# Patient Record
Sex: Female | Born: 1985
Health system: Southern US, Community
[De-identification: ages and names within clinical notes are randomized; demographics above are authoritative.]

## PROBLEM LIST (undated history)

## (undated) DIAGNOSIS — Z9884 Bariatric surgery status: Secondary | ICD-10-CM

## (undated) DIAGNOSIS — D509 Iron deficiency anemia, unspecified: Secondary | ICD-10-CM

## (undated) DIAGNOSIS — E282 Polycystic ovarian syndrome: Secondary | ICD-10-CM

## (undated) DIAGNOSIS — D649 Anemia, unspecified: Secondary | ICD-10-CM

## (undated) DIAGNOSIS — E559 Vitamin D deficiency, unspecified: Secondary | ICD-10-CM

## (undated) DIAGNOSIS — Z91018 Allergy to other foods: Secondary | ICD-10-CM

## (undated) HISTORY — DX: Allergy to other foods: Z91.018

## (undated) HISTORY — DX: Polycystic ovarian syndrome: E28.2

## (undated) HISTORY — DX: Bariatric surgery status: Z98.84

## (undated) HISTORY — DX: Iron deficiency anemia, unspecified: D50.9

## (undated) HISTORY — DX: Vitamin D deficiency, unspecified: E55.9

---

## 2012-04-24 DIAGNOSIS — Z789 Other specified health status: Secondary | ICD-10-CM | POA: Insufficient documentation

## 2012-11-21 ENCOUNTER — Ambulatory Visit (INDEPENDENT_AMBULATORY_CARE_PROVIDER_SITE_OTHER): Payer: No Typology Code available for payment source | Admitting: Psychiatry

## 2012-11-21 DIAGNOSIS — Z7189 Other specified counseling: Secondary | ICD-10-CM

## 2012-11-21 DIAGNOSIS — F4323 Adjustment disorder with mixed anxiety and depressed mood: Secondary | ICD-10-CM

## 2012-12-06 ENCOUNTER — Ambulatory Visit (INDEPENDENT_AMBULATORY_CARE_PROVIDER_SITE_OTHER): Payer: No Typology Code available for payment source | Admitting: Psychiatry

## 2012-12-06 DIAGNOSIS — F4323 Adjustment disorder with mixed anxiety and depressed mood: Secondary | ICD-10-CM

## 2012-12-06 DIAGNOSIS — Z7189 Other specified counseling: Secondary | ICD-10-CM

## 2012-12-14 ENCOUNTER — Ambulatory Visit (INDEPENDENT_AMBULATORY_CARE_PROVIDER_SITE_OTHER): Payer: No Typology Code available for payment source | Admitting: Psychiatry

## 2012-12-14 DIAGNOSIS — F4323 Adjustment disorder with mixed anxiety and depressed mood: Secondary | ICD-10-CM

## 2012-12-14 DIAGNOSIS — Z7189 Other specified counseling: Secondary | ICD-10-CM

## 2012-12-27 ENCOUNTER — Ambulatory Visit (INDEPENDENT_AMBULATORY_CARE_PROVIDER_SITE_OTHER): Payer: No Typology Code available for payment source | Admitting: Psychiatry

## 2012-12-27 DIAGNOSIS — F4323 Adjustment disorder with mixed anxiety and depressed mood: Secondary | ICD-10-CM

## 2012-12-27 DIAGNOSIS — Z7189 Other specified counseling: Secondary | ICD-10-CM

## 2013-01-10 ENCOUNTER — Ambulatory Visit: Payer: No Typology Code available for payment source | Admitting: Psychiatry

## 2013-01-17 ENCOUNTER — Ambulatory Visit: Payer: No Typology Code available for payment source | Admitting: Psychiatry

## 2013-01-24 ENCOUNTER — Ambulatory Visit: Payer: No Typology Code available for payment source | Admitting: Psychiatry

## 2013-01-25 ENCOUNTER — Ambulatory Visit: Payer: No Typology Code available for payment source | Admitting: Psychiatry

## 2014-01-26 ENCOUNTER — Ambulatory Visit (HOSPITAL_COMMUNITY)
Admission: RE | Admit: 2014-01-26 | Discharge: 2014-01-26 | Disposition: A | Discharge status: Home / Self Care | Payer: No Typology Code available for payment source | Source: Ambulatory Visit | Attending: Family Medicine | Admitting: Family Medicine

## 2014-01-26 ENCOUNTER — Ambulatory Visit (INDEPENDENT_AMBULATORY_CARE_PROVIDER_SITE_OTHER): Payer: Self-pay | Admitting: Family Medicine

## 2014-01-26 VITALS — BP 108/70 | HR 74 | Temp 99.0°F | Resp 19 | Ht 67.5 in | Wt 262.8 lb

## 2014-01-26 DIAGNOSIS — G4452 New daily persistent headache (NDPH): Secondary | ICD-10-CM

## 2014-01-26 DIAGNOSIS — J029 Acute pharyngitis, unspecified: Secondary | ICD-10-CM

## 2014-01-26 LAB — COMPREHENSIVE METABOLIC PANEL
ALBUMIN: 4.1 g/dL (ref 3.5–5.2)
ALK PHOS: 64 U/L (ref 39–117)
ALT: 9 U/L (ref 0–35)
AST: 15 U/L (ref 0–37)
BUN: 10 mg/dL (ref 6–23)
CO2: 25 mEq/L (ref 19–32)
Calcium: 9.2 mg/dL (ref 8.4–10.5)
Chloride: 104 mEq/L (ref 96–112)
Creat: 0.74 mg/dL (ref 0.50–1.10)
Glucose, Bld: 88 mg/dL (ref 70–99)
POTASSIUM: 4.5 meq/L (ref 3.5–5.3)
SODIUM: 138 meq/L (ref 135–145)
Total Bilirubin: 0.9 mg/dL (ref 0.2–1.2)
Total Protein: 7.3 g/dL (ref 6.0–8.3)

## 2014-01-26 LAB — POCT CBC
GRANULOCYTE PERCENT: 63.2 % (ref 37–80)
HCT, POC: 35 % — AB (ref 37.7–47.9)
Hemoglobin: 11.1 g/dL — AB (ref 12.2–16.2)
Lymph, poc: 1.3 (ref 0.6–3.4)
MCH, POC: 27.4 pg (ref 27–31.2)
MCHC: 31.7 g/dL — AB (ref 31.8–35.4)
MCV: 86.4 fL (ref 80–97)
MID (CBC): 0.3 (ref 0–0.9)
MPV: 8.8 fL (ref 0–99.8)
PLATELET COUNT, POC: 241 10*3/uL (ref 142–424)
POC GRANULOCYTE: 2.9 (ref 2–6.9)
POC LYMPH %: 29.2 % (ref 10–50)
POC MID %: 7.6 % (ref 0–12)
RBC: 4.05 M/uL (ref 4.04–5.48)
RDW, POC: 12.7 %
WBC: 4.6 10*3/uL (ref 4.6–10.2)

## 2014-01-26 LAB — POCT URINE PREGNANCY: Preg Test, Ur: NEGATIVE

## 2014-01-26 MED ORDER — KETOROLAC TROMETHAMINE 30 MG/ML IJ SOLN
30.0000 mg | Freq: Once | INTRAMUSCULAR | Status: AC
Start: 2014-01-26 — End: 2014-01-26
  Administered 2014-01-26: 30 mg via INTRAMUSCULAR

## 2014-01-26 MED ORDER — BUTALBITAL-APAP-CAFFEINE 50-325-40 MG PO TABS: 1.0000 | ORAL_TABLET | Freq: Three times a day (TID) | ORAL | Status: AC | PRN

## 2014-01-26 MED ORDER — BUTALBITAL-APAP-CAFFEINE 50-325-40 MG PO TABS: 1.0000 | ORAL_TABLET | Freq: Four times a day (QID) | ORAL | Status: DC | PRN

## 2014-01-26 NOTE — Patient Instructions (Signed)
I hope that you will feel better soon!   You got a shot of toradol again today for headache Don't take further NSAID medications such as ibuprofen or naproxen until tomorrow You can use the fioricet as needed but it can make you a bit sleepy

## 2014-01-26 NOTE — Progress Notes (Signed)
[facility] [address], [place] [address] [phone]  Date:  01/26/2014   Name:  Leah Gilmore   DOB:  04/26/1985   MRN:  [id number]  PCP:  No primary care provider on file.    Chief Complaint: Headache and Nausea   History of Present Illness:  Leah Gilmore is a 28 y.o. very pleasant female patient who presents with the following:  Here today as a new patient with illness. Today is Saturday- two days ago she was seen at another office with suspect strep throat.  She reports that her rapid was negative, a culture is pending.  She got a shot of toradol and is also taking penicilin by mouth.   She has noted a headache for about one week now.   HA is there all the time, it did seem to get better after she had her toradol shot.  The HA is better during the day, worse at night.   Yesterday and last night she took medication- tylenol 500 mg.   She had been taking ibuprofen but changed to tylenol.    She is generally in good health Her throat feels somewhat better.   She has noted some fevers at home; she has gotten up to 100.8. No cough She does have some abdominal pain; she feels constipated.    LMP 01/10/14  She has not noted any LAD This is the worst HA of her life; she is concerned about would like to do a CT scan She notes photophobia, mild phonophobia, nausea but no vomiting, no aura  There are no active problems to display for this patient.   History reviewed. No pertinent past medical history.  History reviewed. No pertinent past surgical history.  History  Substance Use Topics  . Smoking status: Never Smoker   . Smokeless tobacco: Never Used  . Alcohol Use: No    Family History  Problem Relation Age of Onset  . Cancer Father   . Diabetes Maternal Grandmother   . Cancer Maternal Grandfather   . Diabetes Paternal Grandmother   . Hypertension Paternal Grandmother     Allergies not on file  Medication list has been  reviewed and updated.  No current outpatient prescriptions on file prior to visit.   No current facility-administered medications on file prior to visit.    Review of Systems:  As per HPI- otherwise negative.   Physical Examination: Filed Vitals:   01/26/14 0929  BP: 108/70  Pulse: 74  Temp: 99 F (37.2 C)  Resp: 19   Filed Vitals:   01/26/14 0929  Height: 5' 7.5" (1.715 m)  Weight: 262 lb 12.8 oz (119.205 kg)   Body mass index is 40.53 kg/(m^2). Ideal Body Weight: Weight in (lb) to have BMI = 25: 161.7  GEN: WDWN, NAD, Non-toxic, A & O x 3, obese, OW looks well HEENT: Atraumatic, Normocephalic. Neck supple. No masses, No LAD.  Bilateral TM wnl, oropharynx normal.  PEERL,EOMI.   Ears and Nose: No external deformity. CV: RRR, No M/G/R. No JVD. No thrill. No extra heart sounds. PULM: CTA B, no wheezes, crackles, rhonchi. No retractions. No resp. distress. No accessory muscle use. ABD: S, NT, ND, +BS. No rebound. No HSM.  Benign exam EXTR: No c/c/e NEURO Normal gait. Complete neuro exam is normal; normal strength, sensation and DTR all extremities, negative Romberg PSYCH: Normally interactive. Conversant. Not depressed or anxious appearing.  Calm demeanor.   Results for orders placed or performed in  visit on 01/26/14  POCT urine pregnancy  Result Value Ref Range   Preg Test, Ur Negative   POCT CBC  Result Value Ref Range   WBC 4.6 4.6 - 10.2 K/uL   Lymph, poc 1.3 0.6 - 3.4   POC LYMPH PERCENT 29.2 10 - 50 %L   MID (cbc) 0.3 0 - 0.9   POC MID % 7.6 0 - 12 %M   POC Granulocyte 2.9 2 - 6.9   Granulocyte percent 63.2 37 - 80 %G   RBC 4.05 4.04 - 5.48 M/uL   Hemoglobin 11.1 (A) 12.2 - 16.2 g/dL   HCT, POC 47.835.0 (A) 29.537.7 - 47.9 %   MCV 86.4 80 - 97 fL   MCH, POC 27.4 27 - 31.2 pg   MCHC 31.7 (A) 31.8 - 35.4 g/dL   RDW, POC 62.112.7 %   Platelet Count, POC 241 142 - 424 K/uL   MPV 8.8 0 - 99.8 fL    Assessment and Plan: New daily persistent headache - Plan: POCT urine  pregnancy, CT Head Wo Contrast, ketorolac (TORADOL) 30 MG/ML injection 30 mg, butalbital-acetaminophen-caffeine (FIORICET) 50-325-40 MG per tablet, DISCONTINUED: butalbital-acetaminophen-caffeine (FIORICET) 50-325-40 MG per tablet  Sore throat - Plan: POCT CBC, Comprehensive metabolic panel  Here today with worst HA of life.   She may well have mono,  However given her severe headache she would like to do a CT scan of her head today . Will send her for CT, assuming negative she will come back for a toradol shot  Signed Abbe AmsterdamJessica Copland, MD  CT head as below:  CT HEAD WITHOUT CONTRAST  TECHNIQUE: Contiguous axial images were obtained from the base of the skull through the vertex without intravenous contrast.  COMPARISON: None.  FINDINGS: Ventricles, cisterns and other CSF spaces are normal. There is no mass, mass effect, shift of midline structures or acute hemorrhage. No evidence of acute infarction. Remaining bones and soft tissues are within normal.  IMPRESSION: Normal head CT.  She returned to clinic and was given 30 mg of toradol.   May use fioricet also as needed See patient instructions for more details.   Follow-up if not better!

## 2014-01-27 ENCOUNTER — Encounter: Payer: Self-pay | Admitting: Family Medicine

## 2015-10-09 ENCOUNTER — Other Ambulatory Visit (HOSPITAL_COMMUNITY): Payer: Self-pay | Admitting: General Surgery

## 2015-10-23 ENCOUNTER — Ambulatory Visit (HOSPITAL_COMMUNITY)
Admission: RE | Admit: 2015-10-23 | Discharge: 2015-10-23 | Disposition: A | Discharge status: Home / Self Care | Payer: No Typology Code available for payment source | Source: Ambulatory Visit | Attending: General Surgery | Admitting: General Surgery

## 2015-10-29 ENCOUNTER — Ambulatory Visit: Payer: No Typology Code available for payment source | Admitting: Dietician

## 2015-10-30 ENCOUNTER — Encounter: Payer: No Typology Code available for payment source | Attending: General Surgery | Admitting: Dietician

## 2015-10-30 ENCOUNTER — Encounter: Payer: Self-pay | Admitting: Dietician

## 2015-10-30 DIAGNOSIS — Z713 Dietary counseling and surveillance: Secondary | ICD-10-CM | POA: Insufficient documentation

## 2015-10-30 NOTE — Patient Instructions (Addendum)

## 2015-10-30 NOTE — Progress Notes (Signed)
  Pre-Op Assessment Visit:  Pre-Operative RYGB Surgery  Medical Nutrition Therapy:  Appt start time: 1410   End time:  1455.  Patient was seen on 10/30/2015 for Pre-Operative Nutrition Assessment. [facility] coordinator on 10/30/2015.   Preferred Learning Style:   No preference indicated   Learning Readiness:   Ready  Handouts given during visit include:  Pre-Op Goals Bariatric Surgery Protein Shakes   During the appointment today the following Pre-Op Goals were reviewed with the patient: Maintain or lose weight as instructed by your surgeon Make healthy food choices Begin to limit portion sizes Limited concentrated sugars and fried foods Keep fat/sugar in the single digits per serving on   food labels Practice CHEWING your food  (aim for 30 chews per bite or until applesauce consistency) Practice not drinking 15 minutes before, during, and 30 minutes after each meal/snack Avoid all carbonated beverages  Avoid/limit caffeinated beverages  Avoid all sugar-sweetened beverages Consume 3 meals per day; eat every 3-5 hours Make a list of non-food related activities Aim for 64-100 ounces of FLUID daily  Aim for at least 60-80 grams of PROTEIN daily Look for a liquid protein source that contain ?15 g protein and ?5 g carbohydrate  (ex: shakes, drinks, shots)  Patient-Centered Goals: Would like to a be an avid runner and be able to go hiking without pain and play with her kids and be healthy overall.   10 confidence/importance scale  Demonstrated degree of understanding via:  Teach Back  Teaching Method Utilized:  Visual Auditory Hands on  Barriers to learning/adherence to lifestyle change: none  [facility] to enroll in Pre-Op and Post-Op Nutrition Education when surgery date is scheduled.

## 2015-10-31 ENCOUNTER — Encounter: Payer: Self-pay | Admitting: Dietician

## 2015-11-24 ENCOUNTER — Encounter: Payer: No Typology Code available for payment source | Attending: General Surgery | Admitting: Skilled Nursing Facility1

## 2015-11-24 ENCOUNTER — Encounter: Payer: Self-pay | Admitting: Skilled Nursing Facility1

## 2015-11-24 DIAGNOSIS — Z713 Dietary counseling and surveillance: Secondary | ICD-10-CM | POA: Diagnosis present

## 2015-11-24 DIAGNOSIS — E669 Obesity, unspecified: Secondary | ICD-10-CM

## 2015-11-24 NOTE — Progress Notes (Signed)
  Pre-Op Assessment Visit:  Pre-Operative RYGB Surgery  Medical Nutrition Therapy:  Appt start time: 1423   End time:  9532.   Pt returns having lost 3 pounds. Pt states she has 2 more SWL left. Pt states she is Horrible at taking medication simply Cant remember-states she has tried alarms, and many other strategies. Pt states her  Partner is supportive but also not. Pt states her partner has not completely accepted the idea of her having surgery but is now keeping her opinions to herself. Pt states she wants to keep a calendar of taking supplements and bring it in every month to be held accountable. Pt states she is Trying to chew more and struggling with drinking before eating. Pt states she has tried tot alk to her partner about What support looks like. Pt states she is in love with Oreos. Pt states she lost 90 pounds in her life once by not eating due to financial reasons. Pt states none of the children she is taking care of are hers and feels she is rasing her partners children who loves children but the pt states she does not. Pt states she is losing living her own life due to the children she has to raise. Pt states she skips dinner because she has to cook dinner for the children and does not want to make 2 meals. Dietitian gave information on Higinio Roger for this situation. Pt states she is worried she is not eating enough. Recall: Bagel in the morning 10am: fruit Lunch: quinoa and cheese and sour cream bowl Dinner: skip due to making sure the kids are fed 3 kids of her partners, step kid, and foster kid   -Dietitian gave pt counselors in the area hand out.   Patient-Centered Goals: Would like to a be an avid runner and be able to go hiking without pain and play with her kids and be healthy overall.   10 confidence/importance scale  Demonstrated degree of understanding via:  Teach Back  Teaching Method Utilized:  Visual Auditory Hands on  Barriers to learning/adherence to  lifestyle change: none  Patient to call the [facility] to enroll in Pre-Op and Post-Op Nutrition Education when surgery date is scheduled.

## 2015-12-12 ENCOUNTER — Other Ambulatory Visit (HOSPITAL_BASED_OUTPATIENT_CLINIC_OR_DEPARTMENT_OTHER): Payer: Self-pay

## 2015-12-12 DIAGNOSIS — R0683 Snoring: Secondary | ICD-10-CM

## 2015-12-17 ENCOUNTER — Ambulatory Visit (HOSPITAL_BASED_OUTPATIENT_CLINIC_OR_DEPARTMENT_OTHER): Payer: No Typology Code available for payment source | Attending: General Surgery | Admitting: Internal Medicine

## 2015-12-17 DIAGNOSIS — R0683 Snoring: Secondary | ICD-10-CM | POA: Insufficient documentation

## 2015-12-18 ENCOUNTER — Encounter: Payer: Self-pay | Admitting: Skilled Nursing Facility1

## 2015-12-18 ENCOUNTER — Encounter: Payer: No Typology Code available for payment source | Attending: General Surgery | Admitting: Skilled Nursing Facility1

## 2015-12-18 DIAGNOSIS — Z713 Dietary counseling and surveillance: Secondary | ICD-10-CM | POA: Diagnosis not present

## 2015-12-18 DIAGNOSIS — E6609 Other obesity due to excess calories: Secondary | ICD-10-CM

## 2015-12-18 NOTE — Progress Notes (Signed)
  Pre-Op Assessment Visit:  Pre-Operative RYGB Surgery  Medical Nutrition Therapy:  Appt start time: 1410   End time:  1455.   Pt returns having gained 3 pounds.  Pt states she has Started taking flinestones with iron every day, taking them at night with less than 4 ounces of orange juice. Pt states her Eating is a lot better, using meal prep, buying almond and cashew milk and special K with a banana, yesterday a spinach wrap with veggie meat. Pt states she needs to take a grasp of the changes. Pt states she had a sleep study last night. Pt states her chewing has improved. Pt states she is feeding her children better.Pt states she is very excited.  Patient-Centered Goals: Would like to a be an avid runner and be able to go hiking without pain and play with her kids and be healthy overall.   10 confidence/importance scale  Demonstrated degree of understanding via:  Teach Back  Teaching Method Utilized:  Visual Auditory Hands on  Barriers to learning/adherence to lifestyle change: none  Patient to call the [facility] to enroll in Pre-Op and Post-Op Nutrition Education when surgery date is scheduled.

## 2016-01-04 DIAGNOSIS — R0683 Snoring: Secondary | ICD-10-CM | POA: Diagnosis not present

## 2016-01-04 NOTE — Procedures (Signed)
   Patient Name: Leah Gilmore, Quannetta Study Date: 12/17/2015 Gender: Female D.O.B: [birth year] Age (years): 30 Referring Provider: Gaynelle AduEric Wilson Height (inches): 67 Interpreting Physician: Jetty Duhamellinton Jaylenn Altier MD, ABSM Weight (lbs): 314 RPSGT: Clyde SinkBarksdale, [place] BMI: 49 MRN: [id number] Neck Size: 14.00 CLINICAL INFORMATION Sleep Study Type: HST Indication for sleep study: Snoring Epworth Sleepiness Score: 3  SLEEP STUDY TECHNIQUE A multi-channel overnight portable sleep study was performed. The channels recorded were: nasal airflow, thoracic respiratory movement, and oxygen saturation with a pulse oximetry. Snoring was also monitored.  MEDICATIONS Patient self administered medications include: none reported during sleep study  SLEEP ARCHITECTURE Patient was studied for 395.9 minutes. The sleep efficiency was 99.6 % and the patient was supine for 41.6%. The arousal index was 0.0 per hour.  RESPIRATORY PARAMETERS The overall AHI was 0.3 per hour, with a central apnea index of 0.0 per hour. The oxygen nadir was 92% during sleep.  CARDIAC DATA Mean heart rate during sleep was 70.4 bpm.  IMPRESSIONS - No significant obstructive sleep apnea occurred during this study (AHI = 0.3/h). - No significant central sleep apnea occurred during this study (CAI = 0.0/h). - The patient had minimal or no oxygen desaturation during the study (Min O2 = 92%) - Patient snored   DIAGNOSIS - Normal study  RECOMMENDATIONS - Avoid alcohol, sedatives and other CNS depressants that may worsen sleep apnea and disrupt normal sleep architecture. - Sleep hygiene should be reviewed to assess factors that may improve sleep quality. - Weight management and regular exercise should be initiated or continued.  [Electronically signed] 01/04/2016 12:27 PM  Jetty Duhamellinton Jahlisa Rossitto MD, ABSM Diplomate, American Board of Sleep Medicine   NPI: [id number] Waymon BudgeYOUNG,Mariangel Ringley D Diplomate, American Board of Sleep  Medicine  ELECTRONICALLY SIGNED ON:  01/04/2016, 12:26 PM [facility] PH: [phone]   FX: [phone] ACCREDITED BY THE AMERICAN ACADEMY OF SLEEP MEDICINE

## 2016-01-13 ENCOUNTER — Encounter: Payer: No Typology Code available for payment source | Attending: General Surgery | Admitting: Skilled Nursing Facility1

## 2016-01-13 ENCOUNTER — Encounter: Payer: Self-pay | Admitting: Skilled Nursing Facility1

## 2016-01-13 DIAGNOSIS — E6609 Other obesity due to excess calories: Secondary | ICD-10-CM

## 2016-01-13 DIAGNOSIS — Z713 Dietary counseling and surveillance: Secondary | ICD-10-CM | POA: Insufficient documentation

## 2016-01-13 NOTE — Progress Notes (Signed)
  Pre-Op Assessment Visit:  Pre-Operative RYGB Surgery  Medical Nutrition Therapy:  Appt start time: 1410   End time:  1455.   Pt returns having gained 5 pounds. Pt states she is Struggling with drinking with meals. Pt states she struggles with getting enough sleep. Pt states she Gets off at 4:30pm and does not have time to finish all of things she needs to do at the end of the day. Pt does not eat meat or drink milk. Pt states she Only eats eggs, cheese, salmon, shrimp for protein and is afraid to eat soy.Pt states this is her Last SWL appointment.  Morning: boiled egg and premier shake and fruit Watermelon for snack or yogurt Lunch: Chilli Dinner: salmon with quinoa salad Beverages: water, sweet tea  On lunch break walking  Wt: 323 pounds BMI: 50.59  Patient-Centered Goals: Would like to a be an avid runner and be able to go hiking without pain and play with her kids and be healthy overall.   10 confidence/importance scale  Demonstrated degree of understanding via:  Teach Back  Teaching Method Utilized:  Visual Auditory Hands on  Barriers to learning/adherence to lifestyle change: none  Patient to call the [facility] to enroll in Pre-Op and Post-Op Nutrition Education when surgery date is scheduled.

## 2016-01-13 NOTE — Patient Instructions (Addendum)
-  Do not prepare any drink for your meals -Cut out the egg stick with the shake and fruit -Cut out the sweet tea  -Keep working on some efficient ways to end your day  -If you do not have a meal have a protein shake with fruit and vegetable

## 2016-02-26 NOTE — Progress Notes (Signed)
Scheduling pre op-- please place SURGICAL ORDERS IN EPIC  THANKS 

## 2016-03-03 ENCOUNTER — Encounter: Payer: No Typology Code available for payment source | Attending: General Surgery | Admitting: Dietician

## 2016-03-03 DIAGNOSIS — Z713 Dietary counseling and surveillance: Secondary | ICD-10-CM | POA: Insufficient documentation

## 2016-03-03 NOTE — Progress Notes (Signed)
Please place SURGICAL ORDERS IN EPIC  THANKS 

## 2016-03-04 ENCOUNTER — Encounter: Payer: Self-pay | Admitting: Dietician

## 2016-03-04 NOTE — Progress Notes (Signed)
  Pre-Operative Nutrition Class:  Appt start time: 2751   End time:  1830.  Patient was seen on 03/03/2016 for Pre-Operative Bariatric Surgery Education at [facility].   Surgery date: 03/29/2016 Surgery type: RYGB Start weight at [facility]: 318 lbs on 10/30/2015 Weight today: 328 lbs  TANITA  BODY COMP RESULTS  03/03/16   BMI (kg/m^2) 51.4   Fat Mass (lbs) 190.8   Fat Free Mass (lbs) 137.2   Total Body Water (lbs) 104.2   Samples given per MNT protocol. Patient educated on appropriate usage: Bariatric Advantage Multivitamin (mixed fruit - qty 1) Lot #: [id number] Exp: 01/2017  Bariatric Advantage Calcium Citrate chew (raspberry - qty 1) Lot #: [id number] Exp: 09/2016  Premier protein shake (chocolate - qty 1) Lot #: [id number] Exp: 11/2016  The following the learning objectives were met by the patient during this course:  Identify Pre-Op Dietary Goals and will begin 2 weeks pre-operatively  Identify appropriate sources of fluids and proteins   State protein recommendations and appropriate sources pre and post-operatively  Identify Post-Operative Dietary Goals and will follow for 2 weeks post-operatively  Identify appropriate multivitamin and calcium sources  Describe the need for physical activity post-operatively and will follow MD recommendations  State when to call healthcare provider regarding medication questions or post-operative complications  Handouts given during class include:  Pre-Op Bariatric Surgery Diet Handout  Protein Shake Handout  Post-Op Bariatric Surgery Nutrition Handout  BELT Program Information Flyer  Support Group Information Flyer  [facility] Bariatric Supplements Price List  Follow-Up Plan: Patient will follow-up at [facility] 2 weeks post operatively for diet advancement per MD.

## 2016-03-19 ENCOUNTER — Ambulatory Visit: Payer: Self-pay | Admitting: General Surgery

## 2016-03-19 NOTE — H&P (Signed)
Leah Gilmore 03/19/2016 2:27 PM [facility]: [facility] Patient #: [id number] DOB: 07/09/1985 Single / Language: English / Race: White Female  History of Present Illness (Takahiro Godinho M. Lainie Daubert MD; 03/19/2016 2:56 PM) The patient is a 30 year old female who presents for a pre-op visit. She comes in today for her preoperative appointment. She has been scheduled in approved for laparoscopic Roux-en-Y gastric bypass. Her initial visit was in July 2017. Her weight at that time was 313 pounds. She denies any significant medical changes other than she did have an issue with a rash on 2 different occasions. She saw a allergist and underwent skin testing. She does have a reaction to cockroaches and dust mites. The allergist is also planning to do a blood test in the near future. Otherwise she denies any medical changes. She denies any chest pain, chest pressure, shortness of breath, orthopnea, paroxysmal nocturnal dyspnea.  Her evaluation revealed iron deficiency anemia with an iron level of 25, hemoglobin level is 10.5. She states that she has been taking a multivitamin with iron twice a day since August. HDL level was low at 42 otherwise her bariatric labs were unremarkable. Home sleep test was negative for sleep apnea. Upper GI was also normal. She also reports that she has been attending support group sessions   Problem List/Past Medical (Kennley Schwandt M. Griffith Santilli, MD; 03/19/2016 2:58 PM) PCOS (POLYCYSTIC OVARIAN SYNDROME) (E28.2) MORBID OBESITY WITH BMI OF 45.0-49.9, ADULT (E66.01) She is morbidly obese and has failed attempts at medical weight loss. I believe she is an appropriate candidate for weight loss surgery. We discussed laparoscopic adjustable gastric band surgery in detail. We discussed the risk and benefits of that procedure. We also discussed the results with the lap band. We also discussed that for whatever reason African-American females have a harder time with being successful  with the laparoscopic adjustable gastric band procedure. After much discussion she is leaning toward a gastric bypass. I encouraged her to attend our support group to discuss weight loss surgery was on a postoperative patient's as well as to look at our website that has additional information regarding weight loss surgery as well. SNORING (R06.83) IRON DEFICIENCY ANEMIA, UNSPECIFIED IRON DEFICIENCY ANEMIA TYPE (D50.9)  Past Surgical History (Christen Lambert, RMA; 03/19/2016 2:27 PM) No pertinent past surgical history  Diagnostic Studies History (Christen Lambert, RMA; 03/19/2016 2:27 PM) Colonoscopy never Mammogram never Pap Smear 1-5 years ago  Allergies (Christen Lambert, RMA; 03/19/2016 2:27 PM) Zithromax *MACROLIDES* Hives.  Medication History (Cassaundra Rasch M. Jess Sulak, MD; 03/19/2016 2:58 PM) Calcium (500MG Tablet, Oral 3 times a day) Active. bariatric multivitamins (three times daily) Active. Medications Reconciled Pantoprazole Sodium (40MG Tablet DR, 1 (one) Tablet Oral daily, Taken starting 03/19/2016) Active. Zofran ODT (4MG Tablet Disint, 1 (one) Tablet Disperse Oral every six hours, as needed, Taken starting 03/19/2016) Active. OxyCODONE HCl (5MG/5ML Solution, 5-10 Milliliter Oral every four hours, as needed, Taken starting 03/19/2016) Active.  Social History (Christen Lambert, RMA; 03/19/2016 2:27 PM) Alcohol use Occasional alcohol use. Caffeine use Coffee, Tea. No drug use Tobacco use Never smoker.  Family History (Christen Lambert, RMA; 03/19/2016 2:27 PM) Alcohol Abuse Father. Arthritis Family Members In General. Bleeding disorder Mother. Colon Cancer Family Members In General. Diabetes Mellitus Family Members In General. Seizure disorder Family Members In General. Thyroid problems Mother.  Pregnancy / Birth History (Christen Lambert, RMA; 03/19/2016 2:27 PM) Age at menarche 12 years. Gravida 0 Irregular periods Para 0  Other Problems (Kerrion Kemppainen M.  Robynne Roat, MD; 03/19/2016 2:58 PM)   No pertinent past medical history     Review of Systems (Carnel Stegman M. Quantina Dershem MD; 03/19/2016 2:54 PM) General Present- Weight Gain. Not Present- Appetite Loss, Chills, Fatigue, Fever, Night Sweats and Weight Loss. Skin Not Present- Change in Wart/Mole, Dryness, Hives, Jaundice, New Lesions, Non-Healing Wounds, Rash and Ulcer. HEENT Not Present- Earache, Hearing Loss, Hoarseness, Nose Bleed, Oral Ulcers, Ringing in the Ears, Seasonal Allergies, Sinus Pain, Sore Throat, Visual Disturbances, Wears glasses/contact lenses and Yellow Eyes. Respiratory Not Present- Bloody sputum, Chronic Cough, Difficulty Breathing, Snoring and Wheezing. Breast Not Present- Breast Mass, Breast Pain, Nipple Discharge and Skin Changes. Cardiovascular Not Present- Chest Pain, Difficulty Breathing Lying Down, Leg Cramps, Palpitations, Rapid Heart Rate, Shortness of Breath and Swelling of Extremities. Gastrointestinal Not Present- Abdominal Pain, Bloating, Bloody Stool, Change in Bowel Habits, Chronic diarrhea, Constipation, Difficulty Swallowing, Excessive gas, Gets full quickly at meals, Hemorrhoids, Indigestion, Nausea, Rectal Pain and Vomiting. Female Genitourinary Not Present- Frequency, Nocturia, Painful Urination, Pelvic Pain and Urgency. Musculoskeletal Not Present- Back Pain, Joint Pain, Joint Stiffness, Muscle Pain, Muscle Weakness and Swelling of Extremities. Neurological Not Present- Decreased Memory, Fainting, Headaches, Numbness, Seizures, Tingling, Tremor, Trouble walking and Weakness. Psychiatric Not Present- Anxiety, Bipolar, Change in Sleep Pattern, Depression, Fearful and Frequent crying. Endocrine Not Present- Cold Intolerance, Excessive Hunger, Hair Changes, Heat Intolerance, Hot flashes and New Diabetes. Hematology Not Present- Blood Thinners, Easy Bruising, Excessive bleeding, Gland problems, HIV and Persistent Infections.  Vitals (Christen Lambert RMA; 03/19/2016 2:31  PM) 03/19/2016 2:30 PM Weight: 321.4 lb Height: 67.5in Body Surface Area: 2.49 m Body Mass Index: 49.59 kg/m  Temp.: 97.2F  Pulse: 97 (Regular)  BP: 140/90 (Sitting, Left Arm, Standard)      Physical Exam (Tonesha Tsou M. Aleksis Jiggetts MD; 03/19/2016 2:53 PM)  General Mental Status-Alert. General Appearance-Consistent with stated age. Hydration-Well hydrated. Voice-Normal. Note: morbidly obese, evenly distributed.  Head and Neck Head-normocephalic, atraumatic with no lesions or palpable masses. Trachea-midline. Thyroid Gland Characteristics - normal size and consistency.  Eye Eyeball - Bilateral-Extraocular movements intact. Sclera/Conjunctiva - Bilateral-No scleral icterus.  Chest and Lung Exam Chest and lung exam reveals -quiet, even and easy respiratory effort with no use of accessory muscles and on auscultation, normal breath sounds, no adventitious sounds and normal vocal resonance. Inspection Chest Wall - Normal. Back - normal.  Breast - Did not examine.  Cardiovascular Cardiovascular examination reveals -normal heart sounds, regular rate and rhythm with no murmurs and normal pedal pulses bilaterally.  Abdomen Inspection Inspection of the abdomen reveals - No Hernias. Skin - Scar - no surgical scars. Palpation/Percussion Palpation and Percussion of the abdomen reveal - Soft, Non Tender, No Rebound tenderness, No Rigidity (guarding) and No hepatosplenomegaly. Auscultation Auscultation of the abdomen reveals - Bowel sounds normal.  Peripheral Vascular Upper Extremity Palpation - Pulses bilaterally normal.  Neurologic Neurologic evaluation reveals -alert and oriented x 3 with no impairment of recent or remote memory. Mental Status-Normal.  Neuropsychiatric The patient's mood and affect are described as -normal. Judgment and Insight-insight is appropriate concerning matters relevant to self.  Musculoskeletal Normal Exam -  Left-Upper Extremity Strength Normal and Lower Extremity Strength Normal. Normal Exam - Right-Upper Extremity Strength Normal and Lower Extremity Strength Normal.  Lymphatic Head & Neck  General Head & Neck Lymphatics: Bilateral - Description - Normal. Axillary - Did not examine. Femoral & Inguinal - Did not examine.    Assessment & Plan (Maelynn Moroney M. Rashon Rezek MD; 03/19/2016 2:57 PM)  MORBID OBESITY WITH BMI OF 45.0-49.9, ADULT (E66.01) Impression: We reviewed   her preoperative workup. We discussed the results of her upper GI and labs. I congratulated her or attending support group preoperatively. We rediscussed the typical hospitalization and postoperative course. We discussed the importance of preoperative meal plan. She was given her postoperative prescriptions today. All of her questions were asked and answered.  Current Plans Pt Education - EMW_preopbariatric PCOS (POLYCYSTIC OVARIAN SYNDROME) (E28.2)  IRON DEFICIENCY ANEMIA, UNSPECIFIED IRON DEFICIENCY ANEMIA TYPE (D50.9)  Jaceion Aday M. Aser Nylund, MD, FACS General, Bariatric, & Minimally Invasive Surgery [facility]   

## 2016-03-23 NOTE — Patient Instructions (Signed)
Eloy EndQuannetta Stallings  03/23/2016   Your procedure is scheduled on: 03/29/16  Report [facility]  Entrance take [place]  elevators to 3rd floor to  Short Stay Center at   0515 AM.  Call this number if you have problems the morning of surgery [phone]   Remember: ONLY 1 PERSON MAY GO WITH YOU TO SHORT STAY TO GET  READY MORNING OF YOUR SURGERY.  Do not eat food or drink liquids :After Midnight.     Take these medicines the morning of surgery with A SIP OF WATER: NONE             You may not have any metal on your body including hair pins and              piercings  Do not wear jewelry, make-up, lotions, powders or perfumes, deodorant             Do not wear nail polish.  Do not shave  48 hours prior to surgery.              Do not bring valuables to the hospital. [facility] IS NOT             RESPONSIBLE   FOR VALUABLES.  Contacts, dentures or bridgework may not be worn into surgery.  Leave suitcase in the car. After surgery it may be brought to your room.                Please read over the following fact sheets you were given: _____________________________________________________________________             [facility] - Preparing for Surgery Before surgery, you can play an important role.  Because skin is not sterile, your skin needs to be as free of germs as possible.  You can reduce the number of germs on your skin by washing with CHG (chlorahexidine gluconate) soap before surgery.  CHG is an antiseptic cleaner which kills germs and bonds with the skin to continue killing germs even after washing. Please DO NOT use if you have an allergy to CHG or antibacterial soaps.  If your skin becomes reddened/irritated stop using the CHG and inform your nurse when you arrive at Short Stay. Do not shave (including legs and underarms) for at least 48 hours prior to the first CHG shower.  You may shave your face/neck. Please follow these instructions  carefully:  1.  Shower with CHG Soap the night before surgery and the  morning of Surgery.  2.  If you choose to wash your hair, wash your hair first as usual with your  normal  shampoo.  3.  After you shampoo, rinse your hair and body thoroughly to remove the  shampoo.                           4.  Use CHG as you would any other liquid soap.  You can apply chg directly  to the skin and wash                       Gently with a scrungie or clean washcloth.  5.  Apply the CHG Soap to your body ONLY FROM THE NECK DOWN.   Do not use on face/ open  Wound or open sores. Avoid contact with eyes, ears mouth and genitals (private parts).                       Wash face,  Genitals (private parts) with your normal soap.             6.  Wash thoroughly, paying special attention to the area where your surgery  will be performed.  7.  Thoroughly rinse your body with warm water from the neck down.  8.  DO NOT shower/wash with your normal soap after using and rinsing off  the CHG Soap.                9.  Pat yourself dry with a clean towel.            10.  Wear clean pajamas.            11.  Place clean sheets on your bed the night of your first shower and do not  sleep with pets. Day of Surgery : Do not apply any lotions/deodorants the morning of surgery.  Please wear clean clothes to the hospital/surgery center.  FAILURE TO FOLLOW THESE INSTRUCTIONS MAY RESULT IN THE CANCELLATION OF YOUR SURGERY PATIENT SIGNATURE_________________________________  NURSE SIGNATURE__________________________________  ________________________________________________________________________

## 2016-03-25 ENCOUNTER — Inpatient Hospital Stay (HOSPITAL_COMMUNITY)
Admission: RE | Admit: 2016-03-25 | Discharge: 2016-03-25 | Disposition: A | Discharge status: Home / Self Care | Payer: No Typology Code available for payment source | Source: Ambulatory Visit

## 2016-03-25 ENCOUNTER — Encounter (HOSPITAL_COMMUNITY)
Admission: RE | Admit: 2016-03-25 | Discharge: 2016-03-25 | Disposition: A | Discharge status: Home / Self Care | Payer: No Typology Code available for payment source | Source: Ambulatory Visit | Attending: General Surgery | Admitting: General Surgery

## 2016-03-25 ENCOUNTER — Encounter (HOSPITAL_COMMUNITY): Payer: Self-pay

## 2016-03-25 DIAGNOSIS — Z01812 Encounter for preprocedural laboratory examination: Secondary | ICD-10-CM | POA: Insufficient documentation

## 2016-03-25 HISTORY — DX: Anemia, unspecified: D64.9

## 2016-03-25 LAB — CBC WITH DIFFERENTIAL/PLATELET
BASOS PCT: 0 %
Basophils Absolute: 0 10*3/uL (ref 0.0–0.1)
Eosinophils Absolute: 0.1 10*3/uL (ref 0.0–0.7)
Eosinophils Relative: 2 %
HEMATOCRIT: 34.4 % — AB (ref 36.0–46.0)
HEMOGLOBIN: 11 g/dL — AB (ref 12.0–15.0)
LYMPHS ABS: 1.8 10*3/uL (ref 0.7–4.0)
Lymphocytes Relative: 34 %
MCH: 26.2 pg (ref 26.0–34.0)
MCHC: 32 g/dL (ref 30.0–36.0)
MCV: 81.9 fL (ref 78.0–100.0)
MONO ABS: 0.5 10*3/uL (ref 0.1–1.0)
MONOS PCT: 9 %
NEUTROS ABS: 2.8 10*3/uL (ref 1.7–7.7)
NEUTROS PCT: 55 %
Platelets: 307 10*3/uL (ref 150–400)
RBC: 4.2 MIL/uL (ref 3.87–5.11)
RDW: 13.8 % (ref 11.5–15.5)
WBC: 5.1 10*3/uL (ref 4.0–10.5)

## 2016-03-25 LAB — COMPREHENSIVE METABOLIC PANEL
ALK PHOS: 72 U/L (ref 38–126)
ALT: 21 U/L (ref 14–54)
ANION GAP: 5 (ref 5–15)
AST: 24 U/L (ref 15–41)
Albumin: 4.2 g/dL (ref 3.5–5.0)
BILIRUBIN TOTAL: 0.7 mg/dL (ref 0.3–1.2)
BUN: 16 mg/dL (ref 6–20)
CALCIUM: 9.2 mg/dL (ref 8.9–10.3)
CO2: 28 mmol/L (ref 22–32)
Chloride: 107 mmol/L (ref 101–111)
Creatinine, Ser: 0.75 mg/dL (ref 0.44–1.00)
Glucose, Bld: 99 mg/dL (ref 65–99)
POTASSIUM: 4.3 mmol/L (ref 3.5–5.1)
Sodium: 140 mmol/L (ref 135–145)
TOTAL PROTEIN: 7.9 g/dL (ref 6.5–8.1)

## 2016-03-25 NOTE — Patient Instructions (Signed)
Leah Gilmore  03/25/2016   Your procedure is scheduled on: 03/29/2016    Report [facility]  [place]  [place]vators to 3rd floor to  Short Stay Center at  0515 AM.  Call this number if you have problems the morning of surgery [phone]   Remember: ONLY 1 PERSON MAY GO WITH YOU TO SHORT STAY TO GET  READY MORNING OF YOUR SURGERY.  Do not eat food or drink liquids :After Midnight.  Follow bowel prep per MD prior to surgery    Take these medicines the morning of surgery with A SIP OF WATER: none                                 You may not have any metal on your body including hair pins and              piercings  Do not wear jewelry, make-up, lotions, powders or perfumes, deodorant             Do not wear nail polish.  Do not shave  48 hours prior to surgery.     Do not bring valuables to the hospital. [facility] IS NOT             RESPONSIBLE   FOR VALUABLES.  Contacts, dentures or bridgework may not be worn into surgery.  Leave suitcase in the car. After surgery it may be brought to your room.       Special Instructions: N/A              Please read over the following fact sheets you were given: _____________________________________________________________________             [facility] - Preparing for Surgery Before surgery, you can play an important role.  Because skin is not sterile, your skin needs to be as free of germs as possible.  You can reduce the number of germs on your skin by washing with CHG (chlorahexidine gluconate) soap before surgery.  CHG is an antiseptic cleaner which kills germs and bonds with the skin to continue killing germs even after washing. Please DO NOT use if you have an allergy to CHG or antibacterial soaps.  If your skin becomes reddened/irritated stop using the CHG and inform your nurse when you arrive at Short Stay. Do not shave (including legs and underarms) for at least 48 hours prior to the  first CHG shower.  You may shave your face/neck. Please follow these instructions carefully:  1.  Shower with CHG Soap the night before surgery and the  morning of Surgery.  2.  If you choose to wash your hair, wash your hair first as usual with your  normal  shampoo.  3.  After you shampoo, rinse your hair and body thoroughly to remove the  shampoo.                           4.  Use CHG as you would any other liquid soap.  You can apply chg directly  to the skin and wash                       Gently with a scrungie or clean washcloth.  5.  Apply the CHG Soap to  your body ONLY FROM THE NECK DOWN.   Do not use on face/ open                           Wound or open sores. Avoid contact with eyes, ears mouth and genitals (private parts).                       Wash face,  Genitals (private parts) with your normal soap.             6.  Wash thoroughly, paying special attention to the area where your surgery  will be performed.  7.  Thoroughly rinse your body with warm water from the neck down.  8.  DO NOT shower/wash with your normal soap after using and rinsing off  the CHG Soap.                9.  Pat yourself dry with a clean towel.            10.  Wear clean pajamas.            11.  Place clean sheets on your bed the night of your first shower and do not  sleep with pets. Day of Surgery : Do not apply any lotions/deodorants the morning of surgery.  Please wear clean clothes to the hospital/surgery center.  FAILURE TO FOLLOW THESE INSTRUCTIONS MAY RESULT IN THE CANCELLATION OF YOUR SURGERY PATIENT SIGNATURE_________________________________  NURSE SIGNATURE__________________________________  ________________________________________________________________________

## 2016-03-25 NOTE — Progress Notes (Signed)
10/23/15 CXR epic

## 2016-03-29 ENCOUNTER — Inpatient Hospital Stay (HOSPITAL_COMMUNITY): Payer: No Typology Code available for payment source | Admitting: Registered Nurse

## 2016-03-29 ENCOUNTER — Inpatient Hospital Stay (HOSPITAL_COMMUNITY)
Admission: RE | Admit: 2016-03-29 | Discharge: 2016-03-30 | DRG: 620 | Disposition: A | Discharge status: Home / Self Care | Payer: No Typology Code available for payment source | Source: Ambulatory Visit | Attending: General Surgery | Admitting: General Surgery

## 2016-03-29 ENCOUNTER — Encounter (HOSPITAL_COMMUNITY): Payer: Self-pay | Admitting: *Deleted

## 2016-03-29 ENCOUNTER — Encounter (HOSPITAL_COMMUNITY)
Admission: RE | Disposition: A | Discharge status: Home / Self Care | Payer: Self-pay | Source: Ambulatory Visit | Attending: General Surgery

## 2016-03-29 DIAGNOSIS — R710 Precipitous drop in hematocrit: Secondary | ICD-10-CM | POA: Diagnosis present

## 2016-03-29 DIAGNOSIS — Z6841 Body Mass Index (BMI) 40.0 and over, adult: Secondary | ICD-10-CM

## 2016-03-29 DIAGNOSIS — Z833 Family history of diabetes mellitus: Secondary | ICD-10-CM | POA: Diagnosis not present

## 2016-03-29 DIAGNOSIS — Z8261 Family history of arthritis: Secondary | ICD-10-CM

## 2016-03-29 DIAGNOSIS — Z9884 Bariatric surgery status: Secondary | ICD-10-CM

## 2016-03-29 DIAGNOSIS — E282 Polycystic ovarian syndrome: Secondary | ICD-10-CM

## 2016-03-29 DIAGNOSIS — Z800 Family history of malignant neoplasm of digestive organs: Secondary | ICD-10-CM

## 2016-03-29 DIAGNOSIS — E8881 Metabolic syndrome: Secondary | ICD-10-CM | POA: Diagnosis present

## 2016-03-29 HISTORY — DX: Polycystic ovarian syndrome: E28.2

## 2016-03-29 HISTORY — DX: Bariatric surgery status: Z98.84

## 2016-03-29 HISTORY — PX: UPPER GI ENDOSCOPY: SHX6162

## 2016-03-29 HISTORY — PX: GASTRIC ROUX-EN-Y: SHX5262

## 2016-03-29 LAB — HEMOGLOBIN AND HEMATOCRIT, BLOOD
HEMATOCRIT: 34.8 % — AB (ref 36.0–46.0)
HEMOGLOBIN: 11.1 g/dL — AB (ref 12.0–15.0)

## 2016-03-29 LAB — PREGNANCY, URINE: Preg Test, Ur: NEGATIVE

## 2016-03-29 SURGERY — LAPAROSCOPIC ROUX-EN-Y GASTRIC BYPASS WITH UPPER ENDOSCOPY
Anesthesia: General | Site: Abdomen

## 2016-03-29 MED ORDER — SUGAMMADEX SODIUM 200 MG/2ML IV SOLN
INTRAVENOUS | Status: DC | PRN
  Administered 2016-03-29: 500 mg via INTRAVENOUS

## 2016-03-29 MED ORDER — CHLORHEXIDINE GLUCONATE CLOTH 2 % EX PADS: 6.0000 | MEDICATED_PAD | Freq: Once | CUTANEOUS | Status: DC

## 2016-03-29 MED ORDER — PANTOPRAZOLE SODIUM 40 MG IV SOLR
40.0000 mg | Freq: Every day | INTRAVENOUS | Status: DC
  Administered 2016-03-29: 40 mg via INTRAVENOUS
  Filled 2016-03-29: qty 40

## 2016-03-29 MED ORDER — ONDANSETRON HCL 4 MG/2ML IJ SOLN
INTRAMUSCULAR | Status: DC | PRN
  Administered 2016-03-29: 4 mg via INTRAVENOUS

## 2016-03-29 MED ORDER — FENTANYL CITRATE (PF) 250 MCG/5ML IJ SOLN
INTRAMUSCULAR | Status: AC
  Filled 2016-03-29: qty 5

## 2016-03-29 MED ORDER — ROCURONIUM BROMIDE 50 MG/5ML IV SOSY
PREFILLED_SYRINGE | INTRAVENOUS | Status: AC
  Filled 2016-03-29: qty 5

## 2016-03-29 MED ORDER — HEPARIN SODIUM (PORCINE) 5000 UNIT/ML IJ SOLN
5000.0000 [IU] | INTRAMUSCULAR | Status: AC
  Administered 2016-03-29: 5000 [IU] via SUBCUTANEOUS
  Filled 2016-03-29: qty 1

## 2016-03-29 MED ORDER — LIDOCAINE 2% (20 MG/ML) 5 ML SYRINGE
INTRAMUSCULAR | Status: DC | PRN
  Administered 2016-03-29: 100 mg via INTRAVENOUS

## 2016-03-29 MED ORDER — HYDROMORPHONE HCL 1 MG/ML IJ SOLN
INTRAMUSCULAR | Status: AC
  Filled 2016-03-29: qty 1

## 2016-03-29 MED ORDER — MIDAZOLAM HCL 5 MG/5ML IJ SOLN
INTRAMUSCULAR | Status: DC | PRN
  Administered 2016-03-29: 2 mg via INTRAVENOUS

## 2016-03-29 MED ORDER — LIDOCAINE 2% (20 MG/ML) 5 ML SYRINGE
INTRAMUSCULAR | Status: AC
  Filled 2016-03-29: qty 5

## 2016-03-29 MED ORDER — PROMETHAZINE HCL 25 MG/ML IJ SOLN
6.2500 mg | INTRAMUSCULAR | Status: DC | PRN
  Administered 2016-03-29: 6.25 mg via INTRAVENOUS

## 2016-03-29 MED ORDER — BUPIVACAINE LIPOSOME 1.3 % IJ SUSP
20.0000 mL | Freq: Once | INTRAMUSCULAR | Status: DC
  Filled 2016-03-29: qty 20

## 2016-03-29 MED ORDER — FENTANYL CITRATE (PF) 100 MCG/2ML IJ SOLN
INTRAMUSCULAR | Status: DC | PRN
Start: 2016-03-29 — End: 2016-03-29
  Administered 2016-03-29: 50 ug via INTRAVENOUS
  Administered 2016-03-29: 200 ug via INTRAVENOUS

## 2016-03-29 MED ORDER — SUGAMMADEX SODIUM 500 MG/5ML IV SOLN
INTRAVENOUS | Status: AC
  Filled 2016-03-29: qty 5

## 2016-03-29 MED ORDER — SUCCINYLCHOLINE CHLORIDE 200 MG/10ML IV SOSY
PREFILLED_SYRINGE | INTRAVENOUS | Status: DC | PRN
  Administered 2016-03-29: 120 mg via INTRAVENOUS

## 2016-03-29 MED ORDER — ROCURONIUM BROMIDE 50 MG/5ML IV SOSY
PREFILLED_SYRINGE | INTRAVENOUS | Status: AC
  Filled 2016-03-29: qty 10

## 2016-03-29 MED ORDER — LACTATED RINGERS IR SOLN
Status: DC | PRN
  Administered 2016-03-29: 1000 mL

## 2016-03-29 MED ORDER — SODIUM CHLORIDE 0.9 % IJ SOLN
INTRAMUSCULAR | Status: AC
  Filled 2016-03-29: qty 50

## 2016-03-29 MED ORDER — ACETAMINOPHEN 160 MG/5ML PO SOLN: 325.0000 mg | ORAL | Status: DC | PRN

## 2016-03-29 MED ORDER — SCOPOLAMINE 1 MG/3DAYS TD PT72
MEDICATED_PATCH | TRANSDERMAL | Status: AC
  Filled 2016-03-29: qty 1

## 2016-03-29 MED ORDER — MORPHINE SULFATE (PF) 2 MG/ML IV SOLN
2.0000 mg | INTRAVENOUS | Status: DC | PRN
  Administered 2016-03-29: 2 mg via INTRAVENOUS
  Administered 2016-03-29 (×2): 4 mg via INTRAVENOUS
  Filled 2016-03-29 (×2): qty 2
  Filled 2016-03-29: qty 1

## 2016-03-29 MED ORDER — DEXAMETHASONE SODIUM PHOSPHATE 10 MG/ML IJ SOLN
INTRAMUSCULAR | Status: AC
  Filled 2016-03-29: qty 1

## 2016-03-29 MED ORDER — PROPOFOL 10 MG/ML IV BOLUS
INTRAVENOUS | Status: DC | PRN
  Administered 2016-03-29: 200 mg via INTRAVENOUS

## 2016-03-29 MED ORDER — BUPIVACAINE HCL (PF) 0.25 % IJ SOLN
INTRAMUSCULAR | Status: AC
  Filled 2016-03-29: qty 30

## 2016-03-29 MED ORDER — ONDANSETRON HCL 4 MG/2ML IJ SOLN: 4.0000 mg | INTRAMUSCULAR | Status: DC | PRN

## 2016-03-29 MED ORDER — ROCURONIUM BROMIDE 10 MG/ML (PF) SYRINGE
PREFILLED_SYRINGE | INTRAVENOUS | Status: DC | PRN
  Administered 2016-03-29: 20 mg via INTRAVENOUS
  Administered 2016-03-29: 50 mg via INTRAVENOUS
  Administered 2016-03-29: 10 mg via INTRAVENOUS
  Administered 2016-03-29: 20 mg via INTRAVENOUS

## 2016-03-29 MED ORDER — HYDROMORPHONE HCL 1 MG/ML IJ SOLN: 0.2500 mg | INTRAMUSCULAR | Status: DC | PRN

## 2016-03-29 MED ORDER — PROMETHAZINE HCL 25 MG/ML IJ SOLN
INTRAMUSCULAR | Status: AC
  Administered 2016-03-29: 6.25 mg
  Filled 2016-03-29: qty 1

## 2016-03-29 MED ORDER — BUPIVACAINE LIPOSOME 1.3 % IJ SUSP
INTRAMUSCULAR | Status: DC | PRN
  Administered 2016-03-29: 20 mL

## 2016-03-29 MED ORDER — DIPHENHYDRAMINE HCL 50 MG/ML IJ SOLN: 12.5000 mg | Freq: Three times a day (TID) | INTRAMUSCULAR | Status: DC | PRN

## 2016-03-29 MED ORDER — SUCCINYLCHOLINE CHLORIDE 200 MG/10ML IV SOSY
PREFILLED_SYRINGE | INTRAVENOUS | Status: AC
  Filled 2016-03-29: qty 10

## 2016-03-29 MED ORDER — KCL IN DEXTROSE-NACL 20-5-0.45 MEQ/L-%-% IV SOLN
INTRAVENOUS | Status: DC
  Administered 2016-03-29 – 2016-03-30 (×2): via INTRAVENOUS
  Filled 2016-03-29 (×4): qty 1000

## 2016-03-29 MED ORDER — METOCLOPRAMIDE HCL 5 MG/ML IJ SOLN
10.0000 mg | Freq: Once | INTRAMUSCULAR | Status: AC
  Administered 2016-03-29: 10 mg via INTRAVENOUS

## 2016-03-29 MED ORDER — EVICEL 5 ML EX KIT
PACK | Freq: Once | CUTANEOUS | Status: AC
  Administered 2016-03-29: 1
  Filled 2016-03-29: qty 1

## 2016-03-29 MED ORDER — METOCLOPRAMIDE HCL 5 MG/ML IJ SOLN
INTRAMUSCULAR | Status: AC
  Filled 2016-03-29: qty 2

## 2016-03-29 MED ORDER — CEFOTETAN DISODIUM-DEXTROSE 2-2.08 GM-% IV SOLR
INTRAVENOUS | Status: AC
Start: 2016-03-29 — End: 2016-03-29
  Filled 2016-03-29: qty 50

## 2016-03-29 MED ORDER — LACTATED RINGERS IV SOLN
INTRAVENOUS | Status: DC | PRN
  Administered 2016-03-29 (×2): via INTRAVENOUS

## 2016-03-29 MED ORDER — SODIUM CHLORIDE 0.9 % IJ SOLN
INTRAMUSCULAR | Status: DC | PRN
  Administered 2016-03-29: 50 mL via INTRAVENOUS

## 2016-03-29 MED ORDER — DEXAMETHASONE SODIUM PHOSPHATE 10 MG/ML IJ SOLN
INTRAMUSCULAR | Status: DC | PRN
Start: 2016-03-29 — End: 2016-03-29
  Administered 2016-03-29: 10 mg via INTRAVENOUS

## 2016-03-29 MED ORDER — 0.9 % SODIUM CHLORIDE (POUR BTL) OPTIME
TOPICAL | Status: DC | PRN
  Administered 2016-03-29: 1000 mL

## 2016-03-29 MED ORDER — INFLUENZA VAC SPLIT QUAD 0.5 ML IM SUSY
0.5000 mL | PREFILLED_SYRINGE | INTRAMUSCULAR | Status: DC
  Filled 2016-03-29: qty 0.5

## 2016-03-29 MED ORDER — ENOXAPARIN SODIUM 30 MG/0.3ML ~~LOC~~ SOLN
30.0000 mg | Freq: Two times a day (BID) | SUBCUTANEOUS | Status: DC
  Administered 2016-03-30: 30 mg via SUBCUTANEOUS
  Filled 2016-03-29: qty 0.3

## 2016-03-29 MED ORDER — CEFOTETAN DISODIUM-DEXTROSE 2-2.08 GM-% IV SOLR
2.0000 g | INTRAVENOUS | Status: AC
  Administered 2016-03-29: 2 g via INTRAVENOUS

## 2016-03-29 MED ORDER — MIDAZOLAM HCL 2 MG/2ML IJ SOLN
INTRAMUSCULAR | Status: AC
Start: 2016-03-29 — End: 2016-03-29
  Filled 2016-03-29: qty 2

## 2016-03-29 MED ORDER — SCOPOLAMINE 1 MG/3DAYS TD PT72
MEDICATED_PATCH | TRANSDERMAL | Status: DC | PRN
  Administered 2016-03-29: 1 via TRANSDERMAL

## 2016-03-29 MED ORDER — PROPOFOL 10 MG/ML IV BOLUS
INTRAVENOUS | Status: AC
  Filled 2016-03-29: qty 40

## 2016-03-29 MED ORDER — ONDANSETRON HCL 4 MG/2ML IJ SOLN
INTRAMUSCULAR | Status: AC
  Filled 2016-03-29: qty 2

## 2016-03-29 MED ORDER — ORAL CARE MOUTH RINSE
15.0000 mL | Freq: Two times a day (BID) | OROMUCOSAL | Status: DC
  Administered 2016-03-29: 15 mL via OROMUCOSAL

## 2016-03-29 MED ORDER — PREMIER PROTEIN SHAKE: 2.0000 [oz_av] | ORAL | Status: DC

## 2016-03-29 MED ORDER — PROMETHAZINE HCL 25 MG/ML IJ SOLN: 12.5000 mg | Freq: Four times a day (QID) | INTRAMUSCULAR | Status: DC | PRN

## 2016-03-29 MED ORDER — OXYCODONE HCL 5 MG/5ML PO SOLN: 5.0000 mg | ORAL | Status: DC | PRN

## 2016-03-29 MED ORDER — HYDROMORPHONE HCL 1 MG/ML IJ SOLN
0.2500 mg | INTRAMUSCULAR | Status: DC | PRN
  Administered 2016-03-29 (×2): 0.5 mg via INTRAVENOUS

## 2016-03-29 MED ORDER — ACETAMINOPHEN 160 MG/5ML PO SOLN: 650.0000 mg | ORAL | Status: DC | PRN

## 2016-03-29 SURGICAL SUPPLY — 69 items
APPLICATOR COTTON TIP 6IN STRL (MISCELLANEOUS) IMPLANT
APPLIER CLIP ROT 13.4 12 LRG (CLIP)
BLADE SURG SZ11 CARB STEEL (BLADE) ×3 IMPLANT
CABLE HIGH FREQUENCY MONO STRZ (ELECTRODE) ×3 IMPLANT
CHLORAPREP W/TINT 26ML (MISCELLANEOUS) ×6 IMPLANT
CLIP APPLIE ROT 13.4 12 LRG (CLIP) IMPLANT
CLIP SUT LAPRA TY ABSORB (SUTURE) ×6 IMPLANT
COVER SURGICAL LIGHT HANDLE (MISCELLANEOUS) IMPLANT
CUTTER FLEX LINEAR 45M (STAPLE) IMPLANT
DERMABOND ADVANCED (GAUZE/BANDAGES/DRESSINGS) ×1
DERMABOND ADVANCED .7 DNX12 (GAUZE/BANDAGES/DRESSINGS) ×2 IMPLANT
DEVICE SUT QUICK LOAD TK 5 (STAPLE) IMPLANT
DEVICE SUT TI-KNOT TK 5X26 (MISCELLANEOUS) IMPLANT
DEVICE SUTURE ENDOST 10MM (ENDOMECHANICALS) ×3 IMPLANT
DRAIN PENROSE 18X1/4 LTX STRL (WOUND CARE) ×3 IMPLANT
ELECT REM PT RETURN 9FT ADLT (ELECTROSURGICAL) ×3
ELECTRODE REM PT RTRN 9FT ADLT (ELECTROSURGICAL) ×2 IMPLANT
GAUZE SPONGE 4X4 12PLY STRL (GAUZE/BANDAGES/DRESSINGS) IMPLANT
GAUZE SPONGE 4X4 16PLY XRAY LF (GAUZE/BANDAGES/DRESSINGS) ×3 IMPLANT
GLOVE BIO SURGEON STRL SZ7.5 (GLOVE) ×3 IMPLANT
GLOVE INDICATOR 8.0 STRL GRN (GLOVE) ×3 IMPLANT
GOWN STRL REUS W/TWL XL LVL3 (GOWN DISPOSABLE) ×12 IMPLANT
HOVERMATT SINGLE USE (MISCELLANEOUS) ×3 IMPLANT
IRRIG SUCT STRYKERFLOW 2 WTIP (MISCELLANEOUS)
IRRIGATION SUCT STRKRFLW 2 WTP (MISCELLANEOUS) IMPLANT
KIT BASIN OR (CUSTOM PROCEDURE TRAY) ×3 IMPLANT
KIT GASTRIC LAVAGE 34FR ADT (SET/KITS/TRAYS/PACK) ×3 IMPLANT
LUBRICANT JELLY K Y 4OZ (MISCELLANEOUS) ×3 IMPLANT
MARKER SKIN DUAL TIP RULER LAB (MISCELLANEOUS) ×3 IMPLANT
NEEDLE SPNL 22GX3.5 QUINCKE BK (NEEDLE) ×3 IMPLANT
PACK CARDIOVASCULAR III (CUSTOM PROCEDURE TRAY) ×3 IMPLANT
RELOAD 45 VASCULAR/THIN (ENDOMECHANICALS) IMPLANT
RELOAD ENDO STITCH 2.0 (ENDOMECHANICALS) ×8
RELOAD STAPLE TA45 3.5 REG BLU (ENDOMECHANICALS) IMPLANT
RELOAD STAPLER BLUE 60MM (STAPLE) ×8 IMPLANT
RELOAD STAPLER GOLD 60MM (STAPLE) ×2 IMPLANT
RELOAD STAPLER WHITE 60MM (STAPLE) ×4 IMPLANT
SCISSORS LAP 5X45 EPIX DISP (ENDOMECHANICALS) ×3 IMPLANT
SET IRRIG TUBING LAPAROSCOPIC (IRRIGATION / IRRIGATOR) ×3 IMPLANT
SHEARS HARMONIC ACE PLUS 45CM (MISCELLANEOUS) ×3 IMPLANT
SLEEVE ADV FIXATION 12X100MM (TROCAR) ×6 IMPLANT
SLEEVE XCEL OPT CAN 5 100 (ENDOMECHANICALS) IMPLANT
SOLUTION ANTI FOG 6CC (MISCELLANEOUS) ×3 IMPLANT
STAPLER ECHELON BIOABSB 60 FLE (MISCELLANEOUS) ×6 IMPLANT
STAPLER ECHELON LONG 60 440 (INSTRUMENTS) ×3 IMPLANT
STAPLER RELOAD BLUE 60MM (STAPLE) ×12
STAPLER RELOAD GOLD 60MM (STAPLE) ×3
STAPLER RELOAD WHITE 60MM (STAPLE) ×6
SUT MNCRL AB 4-0 PS2 18 (SUTURE) ×3 IMPLANT
SUT RELOAD ENDO STITCH 2 48X1 (ENDOMECHANICALS) ×8
SUT RELOAD ENDO STITCH 2.0 (ENDOMECHANICALS) ×8
SUT SURGIDAC NAB ES-9 0 48 120 (SUTURE) IMPLANT
SUT VIC AB 2-0 SH 27 (SUTURE) ×1
SUT VIC AB 2-0 SH 27X BRD (SUTURE) ×2 IMPLANT
SUTURE RELOAD END STTCH 2 48X1 (ENDOMECHANICALS) ×8 IMPLANT
SUTURE RELOAD ENDO STITCH 2.0 (ENDOMECHANICALS) ×8 IMPLANT
SYR 10ML ECCENTRIC (SYRINGE) ×3 IMPLANT
SYR 20CC LL (SYRINGE) ×6 IMPLANT
TIP RIGID 35CM EVICEL (HEMOSTASIS) ×3 IMPLANT
TOWEL OR 17X26 10 PK STRL BLUE (TOWEL DISPOSABLE) ×3 IMPLANT
TOWEL OR NON WOVEN STRL DISP B (DISPOSABLE) ×3 IMPLANT
TRAY FOLEY W/METER SILVER 16FR (SET/KITS/TRAYS/PACK) ×3 IMPLANT
TROCAR ADV FIXATION 12X100MM (TROCAR) ×3 IMPLANT
TROCAR ADV FIXATION 5X100MM (TROCAR) ×3 IMPLANT
TROCAR BLADELESS OPT 5 100 (ENDOMECHANICALS) ×3 IMPLANT
TROCAR XCEL 12X100 BLDLESS (ENDOMECHANICALS) ×3 IMPLANT
TUBING CONNECTING 10 (TUBING) ×3 IMPLANT
TUBING ENDO SMARTCAP PENTAX (MISCELLANEOUS) ×3 IMPLANT
TUBING INSUF HEATED (TUBING) ×3 IMPLANT

## 2016-03-29 NOTE — Op Note (Signed)
Leah Gilmore [id number] 11/05/1985. 03/29/2016  Preoperative diagnosis:    Morbid obesity with BMI of 51   PCOS (polycystic ovarian syndrome) Metabolic syndrome  Postoperative  diagnosis:  1. same  Surgical procedure: Laparoscopic Roux-en-Y gastric bypass (ante-colic, ante-gastric); upper endoscopy  Surgeon: Atilano InaEric M Teigen Bellin, M.D. FACS  Asst.: Feliciana RossettiLuke Kinsinger, MD; Reynold BowenMadeline Johnson PA-S  Anesthesia: General plus exparel  Complications: None   EBL: Minimal   Drains: None   Disposition: PACU in good condition   Indications for procedure: 30yo female with morbid obesity who has been unsuccessful at sustained weight loss. The patient's comorbidities are listed above. We discussed the risk and benefits of surgery including but not limited to anesthesia risk, bleeding, infection, blood clot formation, anastomotic leak, anastomotic stricture, ulcer formation, death, respiratory complications, intestinal blockage, internal hernia, gallstone formation, vitamin and nutritional deficiencies, injury to surrounding structures, failure to lose weight and mood changes.   Description of procedure: Patient is brought to the operating room and general anesthesia induced. The patient had received preoperative broad-spectrum IV antibiotics and subcutaneous heparin. The abdomen was widely sterilely prepped with Chloraprep and draped. Patient timeout was performed and correct patient and procedure confirmed. Access was obtained with a 12 mm Optiview trocar in the left upper quadrant and pneumoperitoneum established without difficulty. Under direct vision 12 mm trocars were placed laterally in the right upper quadrant, right upper quadrant midclavicular line, and to the left and above the umbilicus for the camera port. A 5 mm trocar was placed laterally in the left upper quadrant. exparel was infiltrated in bilateral upper lateral abdominal walls as a TAPP under laparoscopic guidance.  The omentum (which was  very little) was brought into the upper abdomen and the transverse mesocolon elevated and the ligament of Treitz clearly identified. A 40 cm biliopancreatic limb was then carefully measured from the ligament of Treitz. The small intestine was divided at this point with a single firing of the white load linear stapler. A Penrose drain was sutured to the end of the Roux-en-Y limb for later identification. A 100 cm Roux-en-Y limb was then carefully measured. At this point a side-to-side anastomosis was created between the Roux limb and the end of the biliopancreatic limb. This was accomplished with a single firing of the 60 mm white load linear stapler. The common enterotomy was closed with a running 2-0 Vicryl begun at either end of the enterotomy and tied centrally. Eviceal tissue sealant was placed over the anastomosis. The mesenteric defect was then closed with running 2-0 silk. The omentum was then divided with the harmonic scalpel up towards the transverse colon to allow mobility of the Roux limb toward the gastric pouch. The patient was then placed in steep reversed Trendelenburg. Through a 5 mm subxiphoid site the [name] retractor was placed and the left lobe of the liver elevated with excellent exposure of the upper stomach and hiatus. The angle of Hiss was then mobilized with the harmonic scalpel. A 5 cm gastric pouch was then carefully measured along the lesser curve of the stomach. Dissection was carried along the lesser curve at this point with the Harmonic scalpel working carefully back toward the lesser sac at right angles to the lesser curve. The free lesser sac was then entered. After being sure all tubes were removed from the stomach an initial firing of the gold load 60 mm linear stapler was fired at right angles across the lesser curve for about 4 cm. The gastric pouch was further mobilized posteriorly and then  the pouch was completed with 3 further firings of the 60 mm blue load linear stapler  up through the previously dissected angle of His. It was ensured that the pouch was completely mobilized away from the gastric remnant. This created a nice tubular 4-5 cm gastric pouch. The Roux limb was then brought up in an antecolic fashion with the candycane facing to the patient's left without undue tension. The gastrojejunostomy was created with an initial posterior row of 2-0 Vicryl between the Roux limb and the staple line of the gastric pouch. Enterotomies were then made in the gastric pouch and the Roux limb with the harmonic scalpel and at approximately 2-2-1/2 cm anastomosis was created with a single firing of the 60mm blue load linear stapler. The staple line was inspected and was intact without bleeding. The common enterotomy was then closed with running 2-0 Vicryl begun at either end and tied centrally. The Ewall tube was then easily passed through the anastomosis and an outer anterior layer of running 2-0 Vicryl was placed. The Ewald tube was removed. With the outlet of the gastrojejunostomy clamped and under saline irrigation the assistant performed upper endoscopy and with the gastric pouch tensely distended with air-there was no evidence of leak on this test. The pouch was desufflated. The Vonita Moss defect was closed with running 2-0 silk. The abdomen was inspected for any evidence of bleeding or bowel injury and everything looked fine. The Nathanson retractor was removed under direct vision after coating the anastomosis with Eviceal tissue sealant. All CO2 was evacuated and trochars removed. Skin incisions were closed with 4-0 monocryl in a subcuticular fashion by me and the student followed by Dermabond. Sponge needle and instrument counts were correct. The patient was taken to the PACU in good condition.    [name]. Andrey Campanile, MD, FACS General, Bariatric, & Minimally Invasive Surgery [facility], [place]

## 2016-03-29 NOTE — Anesthesia Postprocedure Evaluation (Signed)
Anesthesia Post Note  Patient: Leah Gilmore  Procedure(s) Performed: Procedure(s) (LRB): LAPAROSCOPIC ROUX-EN-Y GASTRIC BYPASS WITH UPPER ENDOSCOPY (N/A) UPPER GI ENDOSCOPY  Patient location during evaluation: PACU Anesthesia Type: General Level of consciousness: awake Pain management: pain level controlled Vital Signs Assessment: post-procedure vital signs reviewed and stable Respiratory status: spontaneous breathing Cardiovascular status: stable Anesthetic complications: no       Last Vitals:  Vitals:   03/29/16 1212 03/29/16 1227  BP: (!) 154/98 132/78  Pulse:    Resp:  20  Temp: 36.5 C 36.4 C    Last Pain:  Vitals:   03/29/16 1212  TempSrc:   PainSc: 8                  Tyashia Morrisette

## 2016-03-29 NOTE — Op Note (Signed)
Preoperative diagnosis: Roux-en-Y gastric bypass  Postoperative diagnosis: Same   Procedure: Upper endoscopy   Surgeon: Pattijo Juste, M.D.  Anesthesia: Gen.   Indications for procedure: This patient was undergoing a Roux-en-Y gastric bypass.   Description of procedure: The endoscopy was placed in the mouth and into the oropharynx and under endoscopic vision it was advanced to the esophagogastric junction. The pouch was insufflated and no bleeding or bubbles were seen. The GEJ was identified at 35 cm from the teeth. The anastomosis was seen at 40 cm from the teeth. No bleeding or leaks were detected. The scope was withdrawn without difficulty.   Dazani Norby, M.D. General, Bariatric, & Minimally Invasive Surgery [facility]    

## 2016-03-29 NOTE — Transfer of Care (Signed)
Immediate Anesthesia Transfer of Care Note  Patient: Leah Gilmore  Procedure(s) Performed: Procedure(s): LAPAROSCOPIC ROUX-EN-Y GASTRIC BYPASS WITH UPPER ENDOSCOPY (N/A) UPPER GI ENDOSCOPY  Patient Location: PACU  Anesthesia Type:General  Level of Consciousness:  sedated, patient cooperative and responds to stimulation  Airway & Oxygen Therapy:Patient Spontanous Breathing and Patient connected to face mask oxgen  Post-op Assessment:  Report given to PACU RN and Post -op Vital signs reviewed and stable  Post vital signs:  Reviewed and stable  Last Vitals:  Vitals:   03/29/16 0524  BP: 133/69  Pulse: 92  Resp: 16  Temp: 36.3 C    Complications: No apparent anesthesia complications

## 2016-03-29 NOTE — Interval H&P Note (Signed)
History and Physical Interval Note:  03/29/2016 7:13 AM  Leah Gilmore  has presented today for surgery, with the diagnosis of MORBID OBESITY  The various methods of treatment have been discussed with the patient and family. After consideration of risks, benefits and other options for treatment, the patient has consented to  Procedure(s): LAPAROSCOPIC ROUX-EN-Y GASTRIC BYPASS WITH UPPER ENDOSCOPY (N/A) as a surgical intervention .  The patient's history has been reviewed, patient examined, no change in status, stable for surgery.  I have reviewed the patient's chart and labs.  Questions were answered to the patient's satisfaction.    Mary SellaEric M. Andrey CampanileWilson, MD, FACS General, Bariatric, & Minimally Invasive Surgery [facility], [place]  [facility]ILSON,[name]

## 2016-03-29 NOTE — H&P (View-Only) (Signed)
Leah Gilmore 03/19/2016 2:27 PM Location: [facility] Patient #: [id number] DOB: 04/10/1985 Single / Language: [place] / Race: White Female  History of Present Illness Leah Gilmore M. Ghali Morissette MD; 03/19/2016 2:56 PM) The patient is a 31 year old female who presents for a pre-op visit. She comes in today for her preoperative appointment. She has been scheduled in approved for laparoscopic Roux-en-Y gastric bypass. Her initial visit was in July 2017. Her weight at that time was 313 pounds. She denies any significant medical changes other than she did have an issue with a rash on 2 different occasions. She saw a allergist and underwent skin testing. She does have a reaction to cockroaches and dust mites. The allergist is also planning to do a blood test in the near future. Otherwise she denies any medical changes. She denies any chest pain, chest pressure, shortness of breath, orthopnea, paroxysmal nocturnal dyspnea.  Her evaluation revealed iron deficiency anemia with an iron level of 25, hemoglobin level is 10.5. She states that she has been taking a multivitamin with iron twice a day since August. HDL level was low at 42 otherwise her bariatric labs were unremarkable. Home sleep test was negative for sleep apnea. Upper GI was also normal. She also reports that she has been attending support group sessions   Problem List/Past Medical Leah Gilmore M. Andrey Campanile, MD; 03/19/2016 2:58 PM) PCOS (POLYCYSTIC OVARIAN SYNDROME) (E28.2) MORBID OBESITY WITH BMI OF 45.0-49.9, ADULT (E66.01) She is morbidly obese and has failed attempts at medical weight loss. I believe she is an appropriate candidate for weight loss surgery. We discussed laparoscopic adjustable gastric band surgery in detail. We discussed the risk and benefits of that procedure. We also discussed the results with the lap band. We also discussed that for whatever reason African-American females have a harder time with being successful  with the laparoscopic adjustable gastric band procedure. After much discussion she is leaning toward a gastric bypass. I encouraged her to attend our support group to discuss weight loss surgery was on a postoperative patient's as well as to look at our website that has additional information regarding weight loss surgery as well. SNORING (R06.83) IRON DEFICIENCY ANEMIA, UNSPECIFIED IRON DEFICIENCY ANEMIA TYPE (D50.9)  Past Surgical History Christianne Dolin, RMA; 03/19/2016 2:27 PM) No pertinent past surgical history  Diagnostic Studies History Christianne Dolin, RMA; 03/19/2016 2:27 PM) Colonoscopy never Mammogram never Pap Smear 1-5 years ago  Allergies Christianne Dolin, RMA; 03/19/2016 2:27 PM) Zithromax *MACROLIDES* Hives.  Medication History Leah Gilmore M. Andrey Campanile, MD; 03/19/2016 2:58 PM) Calcium (500MG  Tablet, Oral 3 times a day) Active. bariatric multivitamins (three times daily) Active. Medications Reconciled Pantoprazole Sodium (40MG  Tablet DR, 1 (one) Tablet Oral daily, Taken starting 03/19/2016) Active. Zofran ODT (4MG  Tablet Disint, 1 (one) Tablet Disperse Oral every six hours, as needed, Taken starting 03/19/2016) Active. OxyCODONE HCl (5MG /5ML Solution, 5-10 Milliliter Oral every four hours, as needed, Taken starting 03/19/2016) Active.  Social History Christianne Dolin, [place]; 03/19/2016 2:27 PM) Alcohol use Occasional alcohol use. Caffeine use Coffee, Tea. No drug use Tobacco use Never smoker.  Family History Christianne Dolin, [place]; 03/19/2016 2:27 PM) Alcohol Abuse Father. Arthritis Family Members In General. Bleeding disorder Mother. Colon Cancer Family Members In General. Diabetes Mellitus Family Members In General. Seizure disorder Family Members In General. Thyroid problems Mother.  Pregnancy / Birth History Christianne Dolin, [place]; 03/19/2016 2:27 PM) Age at menarche 12 years. Gravida 0 Irregular periods Para 0  Other Problems Leah Gilmore M.  Andrey Campanile, MD; 03/19/2016 2:58 PM)  No pertinent past medical history     Review of Systems Leah Gilmore(Leah Leah Gilmore M. Jordon Kristiansen MD; 03/19/2016 2:54 PM) General Present- Weight Gain. Not Present- Appetite Loss, Chills, Fatigue, Fever, Night Sweats and Weight Loss. Skin Not Present- Change in Wart/Mole, Dryness, Hives, Jaundice, New Lesions, Non-Healing Wounds, Rash and Ulcer. HEENT Not Present- Earache, Hearing Loss, Hoarseness, Nose Bleed, Oral Ulcers, Ringing in the Ears, Seasonal Allergies, Sinus Pain, Sore Throat, Visual Disturbances, Wears glasses/contact lenses and Yellow Eyes. Respiratory Not Present- Bloody sputum, Chronic Cough, Difficulty Breathing, Snoring and Wheezing. Breast Not Present- Breast Mass, Breast Pain, Nipple Discharge and Skin Changes. Cardiovascular Not Present- Chest Pain, Difficulty Breathing Lying Down, Leg Cramps, Palpitations, Rapid Heart Rate, Shortness of Breath and Swelling of Extremities. Gastrointestinal Not Present- Abdominal Pain, Bloating, Bloody Stool, Change in Bowel Habits, Chronic diarrhea, Constipation, Difficulty Swallowing, Excessive gas, Gets full quickly at meals, Hemorrhoids, Indigestion, Nausea, Rectal Pain and Vomiting. Female Genitourinary Not Present- Frequency, Nocturia, Painful Urination, Pelvic Pain and Urgency. Musculoskeletal Not Present- Back Pain, Joint Pain, Joint Stiffness, Muscle Pain, Muscle Weakness and Swelling of Extremities. Neurological Not Present- Decreased Memory, Fainting, Headaches, Numbness, Seizures, Tingling, Tremor, Trouble walking and Weakness. Psychiatric Not Present- Anxiety, Bipolar, Change in Sleep Pattern, Depression, Fearful and Frequent crying. Endocrine Not Present- Cold Intolerance, Excessive Hunger, Hair Changes, Heat Intolerance, Hot flashes and New Diabetes. Hematology Not Present- Blood Thinners, Easy Bruising, Excessive bleeding, Gland problems, HIV and Persistent Infections.  Vitals Christianne Dolin(Christen Lambert RMA; 03/19/2016 2:31  PM) 03/19/2016 2:30 PM Weight: 321.4 lb Height: 67.5in Body Surface Area: 2.49 m Body Mass Index: 49.59 kg/m  Temp.: 97.45F  Pulse: 97 (Regular)  BP: 140/90 (Sitting, Left Arm, Standard)      Physical Exam Leah Gilmore(Merriam Brandner M. Lizzett Nobile MD; 03/19/2016 2:53 PM)  General Mental Status-Alert. General Appearance-Consistent with stated age. Hydration-Well hydrated. Voice-Normal. Note: morbidly obese, evenly distributed.  Head and Neck Head-normocephalic, atraumatic with no lesions or palpable masses. Trachea-midline. Thyroid Gland Characteristics - normal size and consistency.  Eye Eyeball - Bilateral-Extraocular movements intact. Sclera/Conjunctiva - Bilateral-No scleral icterus.  Chest and Lung Exam Chest and lung exam reveals -quiet, even and easy respiratory effort with no use of accessory muscles and on auscultation, normal breath sounds, no adventitious sounds and normal vocal resonance. Inspection Chest Wall - Normal. Back - normal.  Breast - Did not examine.  Cardiovascular Cardiovascular examination reveals -normal heart sounds, regular rate and rhythm with no murmurs and normal pedal pulses bilaterally.  Abdomen Inspection Inspection of the abdomen reveals - No Hernias. Skin - Scar - no surgical scars. Palpation/Percussion Palpation and Percussion of the abdomen reveal - Soft, Non Tender, No Rebound tenderness, No Rigidity (guarding) and No hepatosplenomegaly. Auscultation Auscultation of the abdomen reveals - Bowel sounds normal.  Peripheral Vascular Upper Extremity Palpation - Pulses bilaterally normal.  Neurologic Neurologic evaluation reveals -alert and oriented x 3 with no impairment of recent or remote memory. Mental Status-Normal.  Neuropsychiatric The patient's mood and affect are described as -normal. Judgment and Insight-insight is appropriate concerning matters relevant to self.  Musculoskeletal Normal Exam -  Left-Upper Extremity Strength Normal and Lower Extremity Strength Normal. Normal Exam - Right-Upper Extremity Strength Normal and Lower Extremity Strength Normal.  Lymphatic Head & Neck  General Head & Neck Lymphatics: Bilateral - Description - Normal. Axillary - Did not examine. Femoral & Inguinal - Did not examine.    Assessment & Plan Leah Gilmore(Jamaiya Tunnell M. Chasitty Hehl MD; 03/19/2016 2:57 PM)  MORBID OBESITY WITH BMI OF 45.0-49.9, ADULT (E66.01) Impression: We reviewed  her preoperative workup. We discussed the results of her upper GI and labs. I congratulated her or attending support group preoperatively. We rediscussed the typical hospitalization and postoperative course. We discussed the importance of preoperative meal plan. She was given her postoperative prescriptions today. All of her questions were asked and answered.  Current Plans Pt Education - EMW_preopbariatric PCOS (POLYCYSTIC OVARIAN SYNDROME) (E28.2)  IRON DEFICIENCY ANEMIA, UNSPECIFIED IRON DEFICIENCY ANEMIA TYPE (D50.9)  [name]. Andrey Campanile, MD, FACS General, Bariatric, & Minimally Invasive Surgery [facility], [place]

## 2016-03-29 NOTE — Anesthesia Procedure Notes (Signed)
Procedure Name: Intubation Date/Time: 03/29/2016 7:35 AM Performed by: Talbot Grumbling Pre-anesthesia Checklist: Patient identified, Emergency Drugs available, Suction available and Patient being monitored Patient Re-evaluated:Patient Re-evaluated prior to inductionOxygen Delivery Method: Circle system utilized Preoxygenation: Pre-oxygenation with 100% oxygen Intubation Type: IV induction Ventilation: Mask ventilation without difficulty Laryngoscope Size: Mac and 4 Grade View: Grade I Tube type: Oral Tube size: 7.5 mm Number of attempts: 1 Airway Equipment and Method: Stylet Placement Confirmation: ETT inserted through vocal cords under direct vision,  positive ETCO2 and breath sounds checked- equal and bilateral Secured at: 21 cm Tube secured with: Tape Dental Injury: Teeth and Oropharynx as per pre-operative assessment

## 2016-03-29 NOTE — Anesthesia Preprocedure Evaluation (Addendum)
Anesthesia Evaluation  Patient identified by MRN, date of birth, ID band Patient awake    Reviewed: Allergy & Precautions, NPO status , Patient's Chart, lab work & pertinent test results  Airway Mallampati: I  TM Distance: >3 FB     Dental   Pulmonary    breath sounds clear to auscultation       Cardiovascular negative cardio ROS   Rhythm:Regular Rate:Normal     Neuro/Psych    GI/Hepatic negative GI ROS, Neg liver ROS,   Endo/Other    Renal/GU negative Renal ROS     Musculoskeletal   Abdominal   Peds  Hematology  (+) anemia ,   Anesthesia Other Findings   Reproductive/Obstetrics                             Anesthesia Physical Anesthesia Plan  ASA: III  Anesthesia Plan: General   Post-op Pain Management:    Induction: Intravenous  Airway Management Planned: Oral ETT  Additional Equipment:   Intra-op Plan:   Post-operative Plan:   Informed Consent: I have reviewed the patients History and Physical, chart, labs and discussed the procedure including the risks, benefits and alternatives for the proposed anesthesia with the patient or authorized representative who has indicated his/her understanding and acceptance.   Dental advisory given  Plan Discussed with:   Anesthesia Plan Comments:         Anesthesia Quick Evaluation

## 2016-03-30 ENCOUNTER — Encounter (HOSPITAL_COMMUNITY): Payer: Self-pay | Admitting: General Surgery

## 2016-03-30 LAB — COMPREHENSIVE METABOLIC PANEL
ALBUMIN: 3.6 g/dL (ref 3.5–5.0)
ALT: 27 U/L (ref 14–54)
AST: 33 U/L (ref 15–41)
Alkaline Phosphatase: 64 U/L (ref 38–126)
Anion gap: 6 (ref 5–15)
BUN: 8 mg/dL (ref 6–20)
CHLORIDE: 108 mmol/L (ref 101–111)
CO2: 26 mmol/L (ref 22–32)
CREATININE: 0.69 mg/dL (ref 0.44–1.00)
Calcium: 8.8 mg/dL — ABNORMAL LOW (ref 8.9–10.3)
GFR calc Af Amer: >60 mL/min (ref >60)
GLUCOSE: 129 mg/dL — AB (ref 65–99)
POTASSIUM: 4.1 mmol/L (ref 3.5–5.1)
Sodium: 140 mmol/L (ref 135–145)
Total Bilirubin: 1 mg/dL (ref 0.3–1.2)
Total Protein: 7.1 g/dL (ref 6.5–8.1)

## 2016-03-30 LAB — CBC WITH DIFFERENTIAL/PLATELET
BASOS ABS: 0 10*3/uL (ref 0.0–0.1)
BASOS PCT: 0 %
EOS PCT: 0 %
Eosinophils Absolute: 0 10*3/uL (ref 0.0–0.7)
HCT: 30.3 % — ABNORMAL LOW (ref 36.0–46.0)
Hemoglobin: 9.8 g/dL — ABNORMAL LOW (ref 12.0–15.0)
LYMPHS PCT: 15 %
Lymphs Abs: 1.1 10*3/uL (ref 0.7–4.0)
MCH: 25.7 pg — ABNORMAL LOW (ref 26.0–34.0)
MCHC: 32.3 g/dL (ref 30.0–36.0)
MCV: 79.5 fL (ref 78.0–100.0)
MONO ABS: 0.5 10*3/uL (ref 0.1–1.0)
Monocytes Relative: 7 %
NEUTROS ABS: 5.7 10*3/uL (ref 1.7–7.7)
Neutrophils Relative %: 78 %
PLATELETS: 345 10*3/uL (ref 150–400)
RBC: 3.81 MIL/uL — AB (ref 3.87–5.11)
RDW: 13.9 % (ref 11.5–15.5)
WBC: 7.3 10*3/uL (ref 4.0–10.5)

## 2016-03-30 LAB — HEMOGLOBIN AND HEMATOCRIT, BLOOD
HCT: 30.1 % — ABNORMAL LOW (ref 36.0–46.0)
Hemoglobin: 9.8 g/dL — ABNORMAL LOW (ref 12.0–15.0)

## 2016-03-30 MED ORDER — OXYCODONE HCL 5 MG/5ML PO SOLN: 5.0000 mg | Freq: Four times a day (QID) | ORAL | 0 refills | Status: DC | PRN

## 2016-03-30 NOTE — Progress Notes (Signed)
  Progress Note: Metabolic and Bariatric Surgery Service   Subjective: Patient states she is doing great. She has had minimal nausea or abdominal pain. She has ambulated multiple times. She did ice last night which went well.  Objective: Vital signs in last 24 hours: Temp:  [98.6 F (37 C)-99.2 F (37.3 C)] 99.2 F (37.3 C) (01/23 1400) Pulse Rate:  [56-67] 56 (01/23 1400) Resp:  [18] 18 (01/23 1400) BP: (108-135)/(54-76) 135/75 (01/23 1400) SpO2:  [98 %-100 %] 100 % (01/23 1400) Weight:  [146.6 kg (323 lb 3.2 oz)] 146.6 kg (323 lb 3.2 oz) (01/23 0612) Last BM Date: 03/28/16  Intake/Output from previous day: 01/22 0701 - 01/23 0700 In: 4170 [P.O.:120; I.V.:4050] Out: 3950 [Urine:3900; Blood:50] Intake/Output this shift: Total I/O In: 989.6 [I.V.:989.6] Out: 1350 [Urine:1350]  Lungs: Clear to auscultation, nonlabored, symmetric chest rise  Cardiovascular: Regular  Abd: Soft, nondistended, incisions-clean, dry, intact. Very minimal tenderness  Extremities: No edema, positive SCDs  Neuro: Nonfocal, alert, smiling  Lab Results: CBC   Recent Labs  03/30/16 0450 03/30/16 1604  WBC 7.3  --   HGB 9.8* 9.8*  HCT 30.3* 30.1*  PLT 345  --    BMET  Recent Labs  03/30/16 0450  NA 140  K 4.1  CL 108  CO2 26  GLUCOSE 129*  BUN 8  CREATININE 0.69  CALCIUM 8.8*   PT/INR No results for input(s): LABPROT, INR in the last 72 hours. ABG No results for input(s): PHART, HCO3 in the last 72 hours.  Invalid input(s): PCO2, PO2  Studies/Results:  Anti-infectives: Anti-infectives    Start     Dose/Rate Route Frequency Ordered Stop   03/29/16 0504  cefoTEtan in Dextrose 5% (CEFOTAN) IVPB 2 g     2 g Intravenous On call to O.R. 03/29/16 0504 03/29/16 0800      Medications: Scheduled Meds: . enoxaparin (LOVENOX) injection  30 mg Subcutaneous Q12H  . Influenza vac split quadrivalent PF  0.5 mL Intramuscular Tomorrow-1000  . mouth rinse  15 mL Mouth Rinse BID  .  pantoprazole (PROTONIX) IV  40 mg Intravenous QHS  . [START ON 03/31/2016] protein supplement shake  2 oz Oral Q2H   Continuous Infusions: . dextrose 5 % and 0.45 % NaCl with KCl 20 mEq/L 125 mL/hr at 03/30/16 1100   PRN Meds:.oxyCODONE **AND** acetaminophen, acetaminophen (TYLENOL) oral liquid 160 mg/5 mL, diphenhydrAMINE, morphine injection, ondansetron (ZOFRAN) IV, promethazine  Assessment/Plan: Patient Active Problem List   Diagnosis Date Noted  . Morbid obesity with BMI of 50.0-59.9, adult (HCC) 03/29/2016  . PCOS (polycystic ovarian syndrome) 03/29/2016  . Metabolic syndrome 03/29/2016  . 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   s/p Procedure(s): LAPAROSCOPIC ROUX-EN-Y GASTRIC BYPASS WITH UPPER ENDOSCOPY UPPER GI ENDOSCOPY 03/29/2016  Principal Problem:   Morbid obesity with BMI of 50.0-59.9, adult (HCC) Active Problems:   PCOS (polycystic ovarian syndrome)   Metabolic syndrome   S/P gastric bypass  Doing well. No fever. No elevated white blood cell count. Advance to postop day 1 diet Chemical DVT prophylaxis Ambulate, pulmonary toilet Small drop in hemoglobin, repeat later today Start discharge teaching  Disposition:  LOS: 1 day  The patient will be in the hospital for normal postop protocol  Atilano InaWILSON,Arda Keadle M, MD [phone] [facility]

## 2016-03-30 NOTE — Progress Notes (Signed)
Assessment unchanged. Pt verbalized understanding of dc instructions through teach back including follow up care and when to call the doctor. Script x 1 given as provided by Dr. Andrey CampanileWilson. Leah FetterMisha Mathis, RN completed post op bariatric teaching with pt earlier this shift. Doing well. Passing flatus. Denies pain. Tolerating shakes. Discharged via foot per pt request accompanied by NT to front entrance to meet significant other and awaiting vehicle to carry home.

## 2016-03-30 NOTE — Discharge Instructions (Signed)

## 2016-03-30 NOTE — Discharge Summary (Signed)
Physician Discharge Summary  Eloy EndQuannetta Stallings WGN:[id number]:[id number] DOB: 11/17/1985 DOA: 03/29/2016  PCP: Willow OraANDY,CAMILLE L, MD  Admit date: 03/29/2016 Discharge date: 03/30/2016  Recommendations for Outpatient Follow-up:   Follow-up Information    Atilano InaWILSON,Bunny Lowdermilk M, MD Follow up on 04/14/2016.   Specialty:  General Surgery Why:  post op follow up @ 9:15am  Contact information: [address] [place] [address] [phone]        Atilano InaWILSON,Takiera Mayo M, MD Follow up.   Specialty:  General Surgery Contact information: [address] [place] [address] [phone]          Discharge Diagnoses:  Principal Problem:   Morbid obesity with BMI of 50.0-59.9, adult (HCC) Active Problems:   PCOS (polycystic ovarian syndrome)   Metabolic syndrome   S/P gastric bypass   Surgical Procedure: Laparoscopic Roux-en-Y gastric bypass, upper endoscopy  Discharge Condition: Good Disposition: Home  Diet recommendation: Postoperative gastric bypass diet  Filed Weights   03/29/16 0525 03/30/16 0612  Weight: (!) 148.3 kg (327 lb) (!) 146.6 kg (323 lb 3.2 oz)     Hospital Course:  The patient was admitted for a planned laparoscopic Roux-en-Y gastric bypass. Please see operative note. Preoperatively the patient was given 5000 units of subcutaneous heparin for DVT prophylaxis. Postoperative prophylactic Lovenox dosing was started on the morning of postoperative day 1.  The patient was started on ice chips on the evening of POD 0 which they tolerated. On postoperative day 1 The patient's diet was advanced to water ad lib and then to protein shakes which they also tolerated. The patient was ambulating without difficulty. Their vital signs are stable without fever or tachycardia. Their hemoglobin had remained stable after an initial small drop. The patient had received discharge instructions and counseling. The patient inquired about discharge late afternoon POD 1. I reassessed the patient.  She had done well with her water and protein shake intake. She had had minimal nausea without any vomiting. She had had minimal pain. She was alert, nontoxic-appearing, with stable vital signs. They were deemed stable for discharge.   Discharge Instructions  Discharge Instructions    Ambulate hourly while awake    Complete by:  As directed    Call MD for:  difficulty breathing, headache or visual disturbances    Complete by:  As directed    Call MD for:  persistant dizziness or light-headedness    Complete by:  As directed    Call MD for:  persistant nausea and vomiting    Complete by:  As directed    Call MD for:  redness, tenderness, or signs of infection (pain, swelling, redness, odor or green/yellow discharge around incision site)    Complete by:  As directed    Call MD for:  severe uncontrolled pain    Complete by:  As directed    Call MD for:  temperature >101 F    Complete by:  As directed    Diet bariatric full liquid    Complete by:  As directed    Discharge instructions    Complete by:  As directed    See bariatric discharge instructions   Incentive spirometry    Complete by:  As directed    Perform hourly while awake     Allergies as of 03/30/2016      Reactions   Azithromycin    hives   Food    Kiwi & Grapefruit-tongue swells      Medication List  STOP taking these medications   ibuprofen 200 MG tablet Commonly known as:  ADVIL,MOTRIN     TAKE these medications   Calcium 500 MG tablet Take 500 mg by mouth 3 (three) times daily.   MULTIVITAMIN/IRON PO Take 1 tablet by mouth 2 (two) times daily.   oxyCODONE 5 MG/5ML solution Commonly known as:  ROXICODONE Take 5-10 mLs (5-10 mg total) by mouth every 6 (six) hours as needed for moderate pain or severe pain.      Follow-up Information    Atilano Ina, MD Follow up on 04/14/2016.   Specialty:  General Surgery Why:  post op follow up @ 9:15am  Contact information: [address] [place] [address] [phone]        Atilano Ina, MD Follow up.   Specialty:  General Surgery Contact information: [address] [place] [address] [phone]            The results of significant diagnostics from this hospitalization (including imaging, microbiology, ancillary and laboratory) are listed below for reference.    Significant Diagnostic Studies: No results found.  Labs: Basic Metabolic Panel:  Recent Labs Lab 03/25/16 1326 03/30/16 0450  NA 140 140  K 4.3 4.1  CL 107 108  CO2 28 26  GLUCOSE 99 129*  BUN 16 8  CREATININE 0.75 0.69  CALCIUM 9.2 8.8*   Liver Function Tests:  Recent Labs Lab 03/25/16 1326 03/30/16 0450  AST 24 33  ALT 21 27  ALKPHOS 72 64  BILITOT 0.7 1.0  PROT 7.9 7.1  ALBUMIN 4.2 3.6    CBC:  Recent Labs Lab 03/25/16 1326 03/29/16 1111 03/30/16 0450 03/30/16 1604  WBC 5.1  --  7.3  --   NEUTROABS 2.8  --  5.7  --   HGB 11.0* 11.1* 9.8* 9.8*  HCT 34.4* 34.8* 30.3* 30.1*  MCV 81.9  --  79.5  --   PLT 307  --  345  --     CBG: No results for input(s): GLUCAP in the last 168 hours.  Principal Problem:   Morbid obesity with BMI of 50.0-59.9, adult (HCC) Active Problems:   PCOS (polycystic ovarian syndrome)   Metabolic syndrome   S/P gastric bypass   Time coordinating discharge: 15 minutes  Signed:  Atilano Ina, MD [facility], [place] [phone] 03/30/2016, 5:49 PM

## 2016-03-30 NOTE — Progress Notes (Signed)
Patient alert and oriented, pain is controlled. Patient is tolerating fluids, advanced to protein shake today, patient is tolerating well. Reviewed Gastric Bypass discharge instructions with patient and patient is able to articulate understanding. Provided information on BELT program, Support Group and [facility] outpatient pharmacy. All questions answered, will continue to monitor.   Nyaisha Simao RN 

## 2016-03-30 NOTE — Plan of Care (Signed)
Problem: Food- and Nutrition-Related Knowledge Deficit (NB-1.1) Goal: Nutrition education Formal process to instruct or train a patient/client in a skill or to impart knowledge to help patients/clients voluntarily manage or modify food choices and eating behavior to maintain or improve health. Outcome: Completed/Met Date Met: 03/30/16 Nutrition Education Note  Received consult for diet education per DROP protocol.   Discussed 2 week post op diet with pt. Emphasized that liquids must be non carbonated, non caffeinated, and sugar free. Fluid goals discussed. Pt to follow up with outpatient bariatric RD for further diet progression after 2 weeks. Multivitamins and minerals also reviewed. Teach back method used, pt expressed understanding, expect good compliance.   Diet: First 2 Weeks  You will see the nutritionist about two (2) weeks after your surgery. The nutritionist will increase the types of foods you can eat if you are handling liquids well:  If you have severe vomiting or nausea and cannot handle clear liquids lasting longer than 1 day, call your surgeon  Protein Shake  Drink at least 2 ounces of shake 5-6 times per day  Each serving of protein shakes (usually 8 - 12 ounces) should have a minimum of:  15 grams of protein  And no more than 5 grams of carbohydrate  Goal for protein each day:  Men = 80 grams per day  Women = 60 grams per day  Protein powder may be added to fluids such as non-fat milk or Lactaid milk or Soy milk (limit to 35 grams added protein powder per serving)   Hydration  Slowly increase the amount of water and other clear liquids as tolerated (See Acceptable Fluids)  Slowly increase the amount of protein shake as tolerated  Sip fluids slowly and throughout the day  May use sugar substitutes in small amounts (no more than 6 - 8 packets per day; i.e. Splenda)   Fluid Goal  The first goal is to drink at least 8 ounces of protein shake/drink per day (or as directed  by the nutritionist); some examples of protein shakes are Premier Protein, Johnson [employer] Johnson, AMR Corporation, EAS Edge HP, and Unjury. See handout from pre-op Bariatric Education Class:  Slowly increase the amount of protein shake you drink as tolerated  You may find it easier to slowly sip shakes throughout the day  It is important to get your proteins in first  Your fluid goal is to drink 64 - 100 ounces of fluid daily  It may take a few weeks to build up to this  32 oz (or more) should be clear liquids  And  32 oz (or more) should be full liquids (see below for examples)  Liquids should not contain sugar, caffeine, or carbonation   Clear Liquids:  Water or Sugar-free flavored water (i.e. Fruit H2O, Propel)  Decaffeinated coffee or tea (sugar-free)  Crystal Lite, Wyler's Lite, Minute Maid Lite  Sugar-free Jell-O  Bouillon or broth  Sugar-free Popsicle: *Less than 20 calories each; Limit 1 per day   Full Liquids:  Protein Shakes/Drinks + 2 choices per day of other full liquids  Full liquids must be:  No More Than 12 grams of Carbs per serving  No More Than 3 grams of Fat per serving  Strained low-fat cream soup  Non-Fat milk  Fat-free Lactaid Milk  Sugar-free yogurt (Dannon Lite & Fit, Greek yogurt)   Clayton Bibles, MS, RD, LDN Pager: [phone] After Hours Pager: [phone]

## 2016-04-06 ENCOUNTER — Telehealth (HOSPITAL_COMMUNITY): Payer: Self-pay

## 2016-04-06 NOTE — Telephone Encounter (Signed)
Made discharge phone call to patient per DROP protocol. Asking the following questions.    1. Do you have someone to care for you now that you are home?  Yes 2. Are you having pain now that is not relieved by your pain medication?  NO  3. Are you able to drink the recommended daily amount of fluids (48 ounces minimum/day) and protein (60-80 grams/day) as prescribed by the dietitian or nutritional counselor?  Im getting in about 45 oz a day 4. Are you taking the vitamins and minerals as prescribed?  Yes  5. Do you have the "on call" number to contact your surgeon if you have a problem or question?  Yes  6. Are your incisions free of redness, swelling or drainage? (If steri strips, address that these can fall off, shower as tolerated) Yes  7. Have your bowels moved since your surgery?  If not, are you passing gas?  " I have had two bm's since surgery" Spoke with Patient about over the counter medications for constipation.  8. Are you up and walking 3-4 times per day? Yes 9. Were you provided your discharge medications before your surgery or before you were discharged from the hospital and are you taking them without problem?  Yes

## 2016-04-13 ENCOUNTER — Encounter: Payer: Self-pay | Admitting: Skilled Nursing Facility1

## 2016-04-13 ENCOUNTER — Encounter: Payer: No Typology Code available for payment source | Attending: General Surgery | Admitting: Skilled Nursing Facility1

## 2016-04-13 DIAGNOSIS — Z713 Dietary counseling and surveillance: Secondary | ICD-10-CM | POA: Diagnosis not present

## 2016-04-13 DIAGNOSIS — Z6841 Body Mass Index (BMI) 40.0 and over, adult: Secondary | ICD-10-CM

## 2016-04-13 NOTE — Progress Notes (Signed)
Surgery date: 03/29/2016 Surgery type: RYGB Start weight at [facility]: 318 lbs on 10/30/2015 Weight today: 302.2 lbs Wt change: 25.8 TANITA  BODY COMP RESULTS  03/03/16 04/13/2016   BMI (kg/m^2) 51.4 47.3   Fat Mass (lbs) 190.8 170.4   Fat Free Mass (lbs) 137.2 131.8   Total Body Water (lbs) 104.2 99.4   Bariatric Class:  Appt start time: 1530 end time:  1630.  2 Week Post-Operative Nutrition Class  Patient was seen on 04/13/2016 for Post-[facility]ative Nutrition [facility]. Pt states she cannot wait to get to the gym! Pt states she is struggling with a sciatic nerve issue. Pt states she has tried eggs, grits. Pt states she has not had any issues and was released a day early from the hospital. Pt states she is up to about 48-50 fluid ounces. Pt states she is sweating more often and is very tired.   The following the learning objectives were met by the patient during this course:  Identifies Phase 3A (Soft, High Proteins) Dietary Goals and will begin from 2 weeks post-operatively to 2 months post-operatively  Identifies appropriate sources of fluids and proteins   States protein recommendations and appropriate sources post-operatively  Identifies the need for appropriate texture modifications, mastication, and bite sizes when consuming solids  Identifies appropriate multivitamin and calcium sources post-operatively  Describes the need for physical activity post-operatively and will follow MD recommendations  States when to call healthcare provider regarding medication questions or post-operative complications  Handouts given during class include:  Phase 3A: Soft, High Protein Diet Handout  Follow-Up Plan: Patient will follow-up at [facility] in 6 weeks for 2 month post-op nutrition visit for diet advancement per MD.

## 2016-05-25 ENCOUNTER — Encounter: Payer: No Typology Code available for payment source | Attending: General Surgery | Admitting: Skilled Nursing Facility1

## 2016-05-25 ENCOUNTER — Encounter: Payer: Self-pay | Admitting: Skilled Nursing Facility1

## 2016-05-25 DIAGNOSIS — Z713 Dietary counseling and surveillance: Secondary | ICD-10-CM | POA: Diagnosis not present

## 2016-05-25 DIAGNOSIS — Z6841 Body Mass Index (BMI) 40.0 and over, adult: Secondary | ICD-10-CM

## 2016-05-25 NOTE — Progress Notes (Signed)
Surgery date: 03/29/2016 Surgery type: RYGB [facility] lbs on 10/30/2015 Weight today: 302.2 lbs Wt change: 9 TANITA  BODY COMP RESULTS  03/03/16 04/13/2016 05/25/2016   BMI (kg/m^2) 51.4 47.3 44.6   Fat Mass (lbs) 190.8 170.4 154.6   Fat Free Mass (lbs) 137.2 131.8 138.6   Total Body Water (lbs) 104.2 99.4    Follow-up visit:  8 Weeks Post-Operative sleeve gastrectomy Surgery  Medical Nutrition Therapy:  Appt start time: 4:50 end time: 5:15 Pt states Leah Gilmore has 3 children. Pt is doing well. Pt states Leah Gilmore has been on her period for 3 months and will make an appointment with her gynecologist. Pt states Leah Gilmore has PCOS.   Primary concerns today: Post-operative Bariatric Surgery Nutrition Management. Pt likes to be called Leah Gilmore.  24-hr recall: B (AM): premier protein and decaf esspreso (30g) Snk (AM): 1 egg (7-14g) L (PM): chicken (28g) Snk (PM): greek yogurt (12g) D (PM): chicken (28g)  Snk (PM): pickeled okra and cucumber with greek yogurt dipping  Fluid intake: flavored waters and 1 protein shake: 60+ Estimated total protein intake: 105  Medications: see list Supplementation: b complex, calcium, multi  Using straws: no Drinking while eating: no Having you been chewing well: yes Chewing/swallowing difficulties: no Changes in vision: no Changes to mood/headaches: no Hair loss/Cahnges to skin/Changes to nails: no Any difficulty focusing or concentrating: no Sweating: no Dizziness/Lightheaded: no Palpitations: no Carbonated beverages: no N/V/D/C/GAS: at first due to not chewing well Abdominal Pain: no  Recent physical activity:  ADL's  Progress Towards Goal(s):  In progress.  Handouts given during visit include:  Protein + NS veggies   Nutritional Diagnosis:  [place]-3.3 Overweight/obesity related to past poor dietary habits and physical inactivity as evidenced by patient w/ recent RYGB surgery following dietary guidelines for continued weight loss.    Intervention:  Nutrition counseling. Dietitian educated the pt on diet advancement to include NS veggies. Goals: Try to be more active 1 day a week  Teaching Method Utilized:  Visual Auditory Hands on  Barriers to learning/adherence to lifestyle change: none identified   Demonstrated degree of understanding via:  Teach Back   Monitoring/Evaluation:  Dietary intake, exercise, lap band fills, and body weight.

## 2016-07-28 ENCOUNTER — Ambulatory Visit: Payer: No Typology Code available for payment source | Admitting: Skilled Nursing Facility1

## 2016-07-30 ENCOUNTER — Encounter: Payer: No Typology Code available for payment source | Attending: General Surgery | Admitting: Registered"

## 2016-07-30 DIAGNOSIS — Z713 Dietary counseling and surveillance: Secondary | ICD-10-CM | POA: Insufficient documentation

## 2016-07-30 DIAGNOSIS — E669 Obesity, unspecified: Secondary | ICD-10-CM

## 2016-07-30 NOTE — Progress Notes (Signed)
Surgery date: 03/29/2016 Surgery type: RYGB [facility]: 318 lbs on 10/30/2015 Weight today:  268.2 lbs Wt change: 34 lbs loss  TANITA  BODY COMP RESULTS  03/03/16 04/13/2016 05/25/2016 07/30/2016   BMI (kg/m^2) 51.4 47.3 44.6 40.8   Fat Mass (lbs) 190.8 170.4 154.6 140.0   Fat Free Mass (lbs) 137.2 131.8 138.6 128.2   Total Body Water (lbs) 104.2 99.4  95.4   Follow-up visit:  4 Month Post-Operative sleeve gastrectomy Surgery  Medical Nutrition Therapy:  Appt start time: 8:00 end time: 8:40  Pt prefers to be called "Shay". Pt states she has 3 children. Pt states she is forgetting to take one of her MVI due to getting busy during the workday between 10-12pm. Pt states she has to take them in the middle of the day because she gets nauseous if taking MVI first in the morning. 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 Pt states she's been thinking about  becoming pescatarian again and then eventually transitioning to vegetarian. Pt states she has brain fog and vision blurriness when doesn't take MVI. Pt reports she will start [employer] program on Jun 4 and she's excited. Pt states she can handle some carbonated water and not drinking sodas. Pt states she's never had sodas. Pt states she doesn't weigh self as often because she doesn't want to get consumed with the number because she knows that she will.    Primary concerns today: Post-operative Bariatric Surgery Nutrition Management. Pt likes to be called She.  24-hr recall: B (AM): premier protein and unsweetened coffee (30g) Snk (AM): [place] small amt of cheese and nuts 1 egg (7-14g) L (PM): 2 oz chicken (14g) and 6 mini pickles  Snk (PM): none D (PM): chicken mozzerlla with kale chicken patties (28g) with 1 c broccoli Snk (PM): sugar free popsicle or watermelon  Fluid intake: flavored waters, unsweetened tea, and 1 protein shake, sparkling water: 64+ Estimated total protein intake:  79  Medications: see list Supplementation: calcium, multi, iron, Vitamin C  Using straws: yes Drinking while eating: no Having you been chewing well: yes most times Chewing/swallowing difficulties: no Changes in vision: blurriness when skipping MVI Changes to mood/headaches: no; yes Hair loss/Changes to skin/Changes to nails: no Any difficulty focusing or concentrating: no Sweating: no Dizziness/Lightheaded: no Palpitations: no Carbonated beverages: yes N/V/D/C/GAS: no Abdominal Pain: no  Recent physical activity:  ADL's  Progress Towards Goal(s):  In progress.  Handouts given during visit include:  Protein + NS veggies   Nutritional Diagnosis:  [place]-3.3 Overweight/obesity related to past poor dietary habits and physical inactivity as evidenced by patient w/ recent RYGB surgery following dietary guidelines for continued weight loss.   Intervention:  Nutrition counseling.  Goals: - Schedule in vitamins and meals together as a pair during you work day to ensure being able to take in 2 multivitamins.  - Try Light and Fit Protein Smoothie or string cheese as breakfast options on-the-go.  Teaching Method Utilized:  Visual Auditory Hands on  Barriers to learning/adherence to lifestyle change: none identified   Demonstrated degree of understanding via:  Teach Back   Monitoring/Evaluation:  Dietary intake, exercise, lap band fills, and body weight.

## 2016-07-30 NOTE — Patient Instructions (Addendum)
-   Schedule in vitamins and meals together as a pair during you work day to ensure being able to take in 2 multivitamins.   - Try Light and Fit Protein Smoothie or string cheese as breakfast options on-the-go.

## 2016-11-02 ENCOUNTER — Encounter: Payer: Self-pay | Admitting: Registered"

## 2016-11-02 ENCOUNTER — Encounter: Payer: No Typology Code available for payment source | Attending: General Surgery | Admitting: Registered"

## 2016-11-02 ENCOUNTER — Ambulatory Visit: Payer: No Typology Code available for payment source | Admitting: Registered"

## 2016-11-02 DIAGNOSIS — E669 Obesity, unspecified: Secondary | ICD-10-CM

## 2016-11-02 DIAGNOSIS — Z6839 Body mass index (BMI) 39.0-39.9, adult: Secondary | ICD-10-CM | POA: Insufficient documentation

## 2016-11-02 DIAGNOSIS — Z713 Dietary counseling and surveillance: Secondary | ICD-10-CM | POA: Diagnosis not present

## 2016-11-02 NOTE — Patient Instructions (Addendum)
-   Check into [employer] and meet up with them possibly once a week.  - Sign up for 1-miler for Oct. 2018.   - Ask trainers at [employer] program about workout routine top do on Friday and at least one day on the weekend.   - Be consistent with strength-training exercises.  - Continue to aim to not drink 15 min before, during, and waiting 30 min afterwards.  - Continue track protein intake using Baritastic App.   - Keep up the great work!

## 2016-11-02 NOTE — Progress Notes (Signed)
Surgery date: 03/29/2016 Surgery type: RYGB Start weight at [facility]: 318 lbs on 10/30/2015 Weight today:  247.0 Wt change: 21.2 lbs loss Total Wt loss: 71 lbs loss Wt loss goals: be a runner and it not hurt to run  TANITA  BODY COMP RESULTS  03/03/16 04/13/2016 05/25/2016 07/30/2016 11/02/2016   BMI (kg/m^2) 51.4 47.3 44.6 40.8 37.8   Fat Mass (lbs) 190.8 170.4 154.6 140.0 119.6   Fat Free Mass (lbs) 137.2 131.8 138.6 128.2 127.4   Total Body Water (lbs) 104.2 99.4  95.4 94.0   Follow-up visit:  7 Month Post-Operative sleeve gastrectomy Surgery  Medical Nutrition Therapy:  Appt start time: 8:00 end time: 8:45   Pt prefers to be called "Leah Gilmore". Pt states she is running with [employer] app and able to run 1 mile off and on. Pt states she plans to do 1-miler in Oct in [place]. Pt states she attends [employer] on Mon and Wed. Pt states she wants to find a running group that allows her to run in the mornings before work. Pt states she is taking MVI; packs them in ziploc bag daily. Pt states she notices when she starts taking vitamins early she does better with getting them all in. Pt states she does not weigh herself often, about 2x/month max. Pt states she has not had carbonation in a while and tolerates it well. Pt states she enjoys cauliflower pizza crust.    Primary concerns today: Post-operative Bariatric Surgery Nutrition Management.   24-hr recall: B (AM): 3 boiled eggs (18g) or premier protein and unsweetened coffee (30g) Snk (AM): protein smoothie (12g) or yogurt (14g) L (PM): protein shake (30g), vegetarian chili with red kidney beans, vegetarian crumbles (7g) Snk (PM): none D (PM): sometimes pickled okra Snk (PM): string cheese (6g)  Fluid intake: waters, diet peak tea, unsweetened tea, and protein shake: 64+ Estimated total protein intake: 60+ grams   Medications: see list Supplementation: calcium, multi, iron, Vitamin C  Using straws: yes Drinking while eating: yes Having you been  chewing well: yes Chewing/swallowing difficulties: no Changes in vision: no, only blurriness when skipping MVI Changes to mood/headaches: no, no Hair loss/Changes to skin/Changes to nails: no, no, no Any difficulty focusing or concentrating: no Sweating: no Dizziness/Lightheaded: no Palpitations: no Carbonated beverages: no N/V/D/C/GAS: no, no, no, no, no Abdominal Pain: no Dumping Syndrome: no  Recent physical activity:  [employer] 2x/week  Progress Towards Goal(s):  In progress.  Handouts given during visit include:  none   Nutritional Diagnosis:  [place]-3.3 Overweight/obesity related to past poor dietary habits and physical inactivity as evidenced by patient w/ recent RYGB surgery following dietary guidelines for continued weight loss.   Intervention:  Nutrition counseling.  Goals: - Check into [employer] and meet up with them possibly once a week. - Sign up for 1-miler for Oct. 2018.  - Ask trainers at [employer] program about workout routine top do on Friday and at least one day on the weekend.  - Be consistent with strength-training exercises. - Continue to aim to not drink 15 min before, during, and waiting 30 min afterwards. - Continue track protein intake using Baritastic App.  - Keep up the great work!  Teaching Method Utilized:  Visual Auditory Hands on  Barriers to learning/adherence to lifestyle change: none identified   Demonstrated degree of understanding via:  Teach Back   Monitoring/Evaluation:  Dietary intake, exercise, lap band fills, and body weight.

## 2017-03-09 ENCOUNTER — Other Ambulatory Visit: Payer: Self-pay | Admitting: *Deleted

## 2017-03-11 ENCOUNTER — Ambulatory Visit: Payer: No Typology Code available for payment source | Admitting: Family Medicine

## 2017-03-11 ENCOUNTER — Encounter: Payer: Self-pay | Admitting: Family Medicine

## 2017-03-11 ENCOUNTER — Other Ambulatory Visit (HOSPITAL_COMMUNITY)
Admission: RE | Admit: 2017-03-11 | Discharge: 2017-03-11 | Disposition: A | Discharge status: Home / Self Care | Payer: No Typology Code available for payment source | Source: Ambulatory Visit | Attending: Family Medicine | Admitting: Family Medicine

## 2017-03-11 VITALS — BP 118/74 | HR 66 | Temp 98.1°F | Ht 67.25 in | Wt 225.2 lb

## 2017-03-11 DIAGNOSIS — Z9884 Bariatric surgery status: Secondary | ICD-10-CM

## 2017-03-11 DIAGNOSIS — Z124 Encounter for screening for malignant neoplasm of cervix: Secondary | ICD-10-CM | POA: Diagnosis not present

## 2017-03-11 DIAGNOSIS — D509 Iron deficiency anemia, unspecified: Secondary | ICD-10-CM

## 2017-03-11 DIAGNOSIS — Z0000 Encounter for general adult medical examination without abnormal findings: Secondary | ICD-10-CM

## 2017-03-11 DIAGNOSIS — D500 Iron deficiency anemia secondary to blood loss (chronic): Secondary | ICD-10-CM

## 2017-03-11 HISTORY — DX: Iron deficiency anemia, unspecified: D50.9

## 2017-03-11 LAB — COMPREHENSIVE METABOLIC PANEL
ALT: 10 U/L (ref 0–35)
AST: 18 U/L (ref 0–37)
Albumin: 4.2 g/dL (ref 3.5–5.2)
Alkaline Phosphatase: 84 U/L (ref 39–117)
BUN: 10 mg/dL (ref 6–23)
CHLORIDE: 105 meq/L (ref 96–112)
CO2: 25 mEq/L (ref 19–32)
Calcium: 9.2 mg/dL (ref 8.4–10.5)
Creatinine, Ser: 0.69 mg/dL (ref 0.40–1.20)
GFR: 127 mL/min (ref >60.00)
GLUCOSE: 88 mg/dL (ref 70–99)
POTASSIUM: 4.2 meq/L (ref 3.5–5.1)
SODIUM: 139 meq/L (ref 135–145)
TOTAL PROTEIN: 7.3 g/dL (ref 6.0–8.3)
Total Bilirubin: 1.2 mg/dL (ref 0.2–1.2)

## 2017-03-11 LAB — LIPID PANEL
Cholesterol: 137 mg/dL (ref 0–200)
HDL: 43 mg/dL (ref >39.00)
LDL CALC: 81 mg/dL (ref 0–99)
NONHDL: 94.42
Total CHOL/HDL Ratio: 3
Triglycerides: 69 mg/dL (ref 0.0–149.0)
VLDL: 13.8 mg/dL (ref 0.0–40.0)

## 2017-03-11 LAB — CBC WITH DIFFERENTIAL/PLATELET
BASOS PCT: 0.7 % (ref 0.0–3.0)
Basophils Absolute: 0 10*3/uL (ref 0.0–0.1)
Eosinophils Absolute: 0 10*3/uL (ref 0.0–0.7)
Eosinophils Relative: 0.8 % (ref 0.0–5.0)
HCT: 32.3 % — ABNORMAL LOW (ref 36.0–46.0)
Hemoglobin: 10.4 g/dL — ABNORMAL LOW (ref 12.0–15.0)
Lymphocytes Relative: 36.1 % (ref 12.0–46.0)
Lymphs Abs: 1.5 10*3/uL (ref 0.7–4.0)
MCHC: 32.1 g/dL (ref 30.0–36.0)
MCV: 83.7 fl (ref 78.0–100.0)
MONO ABS: 0.4 10*3/uL (ref 0.1–1.0)
MONOS PCT: 8.4 % (ref 3.0–12.0)
NEUTROS ABS: 2.3 10*3/uL (ref 1.4–7.7)
NEUTROS PCT: 54 % (ref 43.0–77.0)
PLATELETS: 284 10*3/uL (ref 150.0–400.0)
RBC: 3.86 Mil/uL — ABNORMAL LOW (ref 3.87–5.11)
RDW: 13.9 % (ref 11.5–15.5)
WBC: 4.2 10*3/uL (ref 4.0–10.5)

## 2017-03-11 LAB — TSH: TSH: 1.12 u[IU]/mL (ref 0.35–4.50)

## 2017-03-11 NOTE — Progress Notes (Signed)
Subjective  Chief Complaint  Patient presents with  . Establish Care    Transfer from [facility]  . Annual Exam with pap    HPI: Leah Gilmore is a 32 y.o. female who presents to [facility] today for a Female Wellness Visit.   Wellness Visit: annual visit with health maintenance review and exam with Pap   32 year old female here for annual exam with Pap smear.  She is 1 year status post gastric bypass surgery and has lost 100 pounds.  She is eating well and exercises intermittently.  She is ecstatic with her results.  Follow-up with surgery has been good.  Postop labs have been normal except for mild persistent iron deficiency anemia.  She has history of PCOS disease and her periods are currently regular.  She should be on iron supplements but does not take them regularly.  No symptoms of fatigue.  She has no new concerns. Lifestyle: Body mass index is 35.02 kg/m. Wt Readings from Last 3 Encounters:  03/11/17 225 lb 4 oz (102.2 kg)  11/02/16 247 lb (112 kg)  05/25/16 293 lb 3.2 oz (133 kg)   Diet: low fat and high protein Exercise: intermittently, aerobics and weightlifting Need for contraception: No, none  Patient Active Problem List   Diagnosis Date Noted  . Iron deficiency anemia 03/11/2017  . PCOS (polycystic ovarian syndrome) 03/29/2016  . 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 PAP SMEAR  11/21/2016  . TETANUS/TDAP  10/11/2023  . INFLUENZA VACCINE  Completed  . HIV Screening  Completed   Immunization History  Administered Date(s) Administered  . Influenza, Seasonal, Injecte, Preservative Fre 11/20/2014  . Influenza-Unspecified 11/29/2016  . Tdap 10/10/2013   We updated and reviewed the patient's past history in detail and it is documented below. Allergies: Patient is allergic to azithromycin and food. Past Medical History Patient  has a past medical history of Anemia, Iron deficiency anemia  (03/11/2017), PCOS (polycystic ovarian syndrome) (03/29/2016), and S/P gastric bypass (03/29/2016). 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 Family History: Patient family history includes Alcohol abuse in her father; Cancer in her maternal grandfather; Cancer (age of onset: 56) in her father; Diabetes in her maternal grandmother and paternal grandmother; Drug abuse in her father; Hypertension in her paternal grandmother. Social History:  Patient  reports that  has never smoked. she has never used smokeless tobacco. She reports that she does not drink alcohol or use drugs.  She is married to [place], they now have 3 children, 1 biological and 2 foster children.    Review of Systems: Constitutional: negative for fever or malaise Ophthalmic: negative for photophobia, double vision or loss of vision Cardiovascular: negative for chest pain, dyspnea on exertion, or new LE swelling Respiratory: negative for SOB or persistent cough Gastrointestinal: negative for abdominal pain, change in bowel habits or melena Genitourinary: negative for dysuria or gross hematuria, no abnormal uterine bleeding or disharge Musculoskeletal: negative for new gait disturbance or muscular weakness Integumentary: negative for new or persistent rashes, no breast lumps Neurological: negative for TIA or stroke symptoms Psychiatric: negative for SI or delusions Allergic/Immunologic: negative for hives Patient Care Team    Relationship Specialty Notifications Start Gilmore  [name], MD PCP - General Family Medicine  03/23/16     Objective  Vitals: BP 118/74 (BP Location: Left Arm, Patient Position: Sitting,  Cuff Size: Large)   Pulse 66   Temp 98.1 F (36.7 C) (Oral)   Ht 5' 7.25" (1.708 m)   Wt 225 lb 4 oz (102.2 kg)   LMP 02/17/2017   SpO2 99%   BMI 35.02 kg/m  General:  Well developed, well nourished, no acute  distress  Psych:  Alert and orientedx3,normal mood and affect HEENT:  Normocephalic, atraumatic, non-icteric sclera, PERRL, oropharynx is clear without mass or exudate, supple neck without adenopathy, mass but mild thyromegaly is present without nodules tenderness. Cardiovascular:  Normal S1, S2, RRR without gallop, rub or murmur, nondisplaced PMI Respiratory:  Good breath sounds bilaterally, CTAB with normal respiratory effort Gastrointestinal: normal bowel sounds, soft, non-tender, no noted masses. No HSM MSK: no deformities, contusions. Joints are without erythema or swelling. Spine and CVA region are nontender Skin:  Warm, no rashes or suspicious lesions noted Neurologic:    Mental status is normal. CN 2-11 are normal. Gross motor and sensory exams are normal. Normal gait. No tremor Breast Exam: No mass, skin retraction or nipple discharge is appreciated in either breast. No axillary adenopathy. Fibrocystic changes are not noted Pelvic Exam: Normal external genitalia, no vulvar or vaginal lesions present. Clear cervix w/o CMT. Bimanual exam reveals a nontender fundus w/o masses, nl size. No adnexal masses present. No inguinal adenopathy. A PAP smear was performed.   Assessment  1. Annual physical exam   2. Pap smear for cervical cancer screening   3. S/P gastric bypass   4. Iron deficiency anemia due to chronic blood loss      Plan  Female Wellness Visit:  Age appropriate Health Maintenance and Prevention measures were discussed with patient. Included topics are cancer screening recommendations, ways to keep healthy (see AVS) including dietary and exercise recommendations, regular eye and dental care, use of seat belts, and avoidance of moderate alcohol use and tobacco use.  Await Pap smear results.  BMI: discussed patient's BMI and encouraged positive lifestyle modifications to help get to or maintain a target BMI.  HM needs and immunizations were addressed and ordered. See below for  orders. See HM and immunization section for updates.  Routine labs and screening tests ordered including cmp, cbc and lipids where appropriate.  Discussed recommendations regarding Vit D and calcium supplementation (see AVS)  Follow up: Return in about 12 months (around 03/11/2018) for your complete physical.    Commons side effects, risks, benefits, and alternatives for medications and treatment plan prescribed today were discussed, and the patient expressed understanding of the given instructions. Patient is instructed to call or message via MyChart if he/she has any questions or concerns regarding our treatment plan. No barriers to understanding were identified. We discussed Red Flag symptoms and signs in detail. Patient expressed understanding regarding what to do in case of urgent or emergency type symptoms.   Medication list was reconciled, printed and provided to the patient in AVS. Patient instructions and summary information was reviewed with the patient as documented in the AVS. This note was prepared with assistance of Dragon voice recognition software. Occasional wrong-word or sound-a-like substitutions may have occurred due to the inherent limitations of voice recognition software  Orders Placed This Encounter  Procedures  . CBC with Differential/Platelet  . Comprehensive metabolic panel  . Lipid panel  . TSH  pap smear, HPV is ascus No orders of the defined types were placed in this encounter.

## 2017-03-11 NOTE — Patient Instructions (Addendum)
It was so good seeing you again! Thank you for establishing with my new practice and allowing me to continue caring for you. It means a lot to me.   Please schedule a follow up appointment with me in 1 year for your complete physical.  Take your iron!  Please do these things to maintain good health!   Exercise at least 30-45 minutes a day,  4-5 days a week.   Eat a low-fat diet with lots of fruits and vegetables, up to 7-9 servings per day.  Drink plenty of water daily. Try to drink 8 8oz glasses per day.  Seatbelts can save your life. Always wear your seatbelt.  Place Smoke Detectors on every level of your home and check batteries every year.  Schedule an appointment with an eye doctor for an eye exam every 1-2 years  Safe sex - use condoms to protect yourself from STDs if you could be exposed to these types of infections. Use birth control if you do not want to become pregnant and are sexually active.  Avoid heavy alcohol use. If you drink, keep it to less than 2 drinks/day and not every day.  Health Care Power of Attorney.  Choose someone you trust that could speak for you if you became unable to speak for yourself.  Depression is common in our stressful world.If you're feeling down or losing interest in things you normally enjoy, please come in for a visit.  If anyone is threatening or hurting you, please get help. Physical or Emotional Violence is never OK.

## 2017-03-14 LAB — CYTOLOGY - PAP: DIAGNOSIS: NEGATIVE

## 2017-04-05 ENCOUNTER — Encounter: Payer: No Typology Code available for payment source | Attending: General Surgery | Admitting: Registered"

## 2017-04-05 ENCOUNTER — Encounter: Payer: Self-pay | Admitting: Registered"

## 2017-04-05 DIAGNOSIS — E669 Obesity, unspecified: Secondary | ICD-10-CM

## 2017-04-05 DIAGNOSIS — Z713 Dietary counseling and surveillance: Secondary | ICD-10-CM | POA: Diagnosis not present

## 2017-04-05 NOTE — Patient Instructions (Addendum)
-   Reach out to mental health professional to help with mental aspect of journey.   - Get back on track with protein intake; at least 60 grams a day.   - Aim to take multivitamins consistently. Try [facility].

## 2017-04-05 NOTE — Progress Notes (Signed)
Follow-up visit:  1 Year Post-Operative Sleeve gastrectomy Surgery  Medical Nutrition Therapy:  Appt start time: 8:00 end time: 8:47  Primary concerns today: Post-operative Bariatric Surgery Nutrition Management.  Non-scale victories: ability to cross legs with ease, increased energy, improved self-esteem, more self-confidence  Surgery date: 03/29/2016 Surgery type: [facility]YGB [facility] lbs on 10/30/2015 Weight today:  221.8 lbs Wt change: 25.2 lbs loss from 247.0 (11/02/2016) Total Wt loss: 97 lbs loss Wt loss goals: be a runner and it not hurt to run; less than 200 lbs  TANITA  BODY COMP RESULTS  03/03/16 04/13/2016 05/25/2016 07/30/2016 11/02/2016 04/05/2017   BMI (kg/m^2) 51.4 47.3 44.6 40.8 37.8 34.7   Fat Mass (lbs) 190.8 170.4 154.6 140.0 119.6 103.2   Fat Free Mass (lbs) 137.2 131.8 138.6 128.2 127.4 118.6   Total Body Water (lbs) 104.2 99.4  95.4 94.0 86.8    Pt prefers to be called "Shay".  Pt states she wants to lose about 40 lbs more because she realizes that she is obsessed with numbers; ultimately to be less than 200 lbs. Pt states she does not remember being less than 200 lbs other than during adolescence and wants to achieve that. Pt states she thinks she is healthy because she does not have any health conditions and she has taken preventative action against diabetes and high blood pressure. Pt states she eats carbohydrates sometimes and they still feel heavy to her. Pt states she is currently intermittent fasting 12pm-12am. Pt states she has always been obsessed with health and fitness. Pt states she is struggling with taking capsule multivitamins daily and wants to go back to chewable version. Pt states she struggles with consistency in everything; reports meal planning and physical activity are also inconsistent from week to week. Pt states she still desires to be an avid runner and improve in that area. Pt states she talked herself out of doing the 1-miler last  fall. Pt is not meeting daily protein goals.   Pt states she has not had carbonation in a while and tolerates it well. Pt states she enjoys cauliflower pizza crust.    24-hr recall: B (AM): none Snk (AM): cucumber, tomato, vinigerates dressing   L (PM): vegetarian chili with red kidney beans, vegetarian crumbles, chili beans, tomatoes (21g) Snk (PM): egg (6g), juice (apples, cucumbers, celery, pineapple) D (PM): brussel sprouts, mashed cauliflower, 3 oz chicken (21g)  Snk (PM): none  Fluid intake: waters, diet peak tea, unsweetened tea, and protein shake: 64+ Estimated total protein intake: ~48 grams   Medications: see list Supplementation: 3 calcium, Bariatric Advantage capsules multi, iron, Vitamin C  Using straws: sporadically Drinking while eating: yes Having you been chewing well: yes Chewing/swallowing difficulties: no Changes in vision: no Changes to mood/headaches: no, no Hair loss/Changes to skin/Changes to nails: no, no, no Any difficulty focusing or concentrating: no Sweating: no Dizziness/Lightheaded: no Palpitations: no Carbonated beverages: carbonated water sometimes N/V/D/C/GAS: no, no, no, no, no Abdominal Pain: no Dumping Syndrome: no  Recent physical activity:  Running 20 min, circuit  And/or strength-training 2x/week   Progress Towards Goal(s):  In progress.  Handouts given during visit include:  Mental Health Professional Resources   Nutritional Diagnosis:  [place]one [place]-3.3 Overweight/obesity related to past poor dietary habits and physical inactivity as evidenced by patient w/ recent RYGB surgery following dietary guidelines for continued weight loss.   Intervention:  Nutrition counseling.  Goals: - Reach out to mental health professional to help with mental aspect of  journey.  - Get back on track with protein intake; at least 60 grams a day.  - Aim to take multivitamins consistently. Try [facility].   Teaching Method Utilized:   Visual Auditory Hands on  Barriers to learning/adherence to lifestyle change: obsession with numbers and wanting to be less than 200 lbs  Demonstrated degree of understanding via:  Teach Back   Monitoring/Evaluation:  Dietary intake, exercise, lap band fills, and body weight. 6 months from 18 month follow-up.

## 2017-09-14 ENCOUNTER — Ambulatory Visit: Payer: No Typology Code available for payment source | Admitting: Registered"

## 2017-09-23 ENCOUNTER — Encounter: Payer: No Typology Code available for payment source | Attending: General Surgery | Admitting: Registered"

## 2017-09-23 ENCOUNTER — Encounter: Payer: Self-pay | Admitting: Registered"

## 2017-09-23 DIAGNOSIS — E669 Obesity, unspecified: Secondary | ICD-10-CM

## 2017-09-23 DIAGNOSIS — K912 Postsurgical malabsorption, not elsewhere classified: Secondary | ICD-10-CM | POA: Insufficient documentation

## 2017-09-23 DIAGNOSIS — Z6832 Body mass index (BMI) 32.0-32.9, adult: Secondary | ICD-10-CM | POA: Insufficient documentation

## 2017-09-23 DIAGNOSIS — E559 Vitamin D deficiency, unspecified: Secondary | ICD-10-CM | POA: Diagnosis not present

## 2017-09-23 DIAGNOSIS — Z713 Dietary counseling and surveillance: Secondary | ICD-10-CM | POA: Diagnosis present

## 2017-09-23 DIAGNOSIS — D509 Iron deficiency anemia, unspecified: Secondary | ICD-10-CM | POA: Diagnosis not present

## 2017-09-23 DIAGNOSIS — Z9884 Bariatric surgery status: Secondary | ICD-10-CM | POA: Diagnosis not present

## 2017-09-23 NOTE — Patient Instructions (Addendum)
-   Make an appt with yourself for physical activity one day on the weekend: deck of cards workout, swimming, walking, jogging, etc.

## 2017-09-23 NOTE — Progress Notes (Signed)
Follow-up visit:  18 Month Post-Operative Sleeve gastrectomy Surgery  Medical Nutrition Therapy:  Appt start time: 10:00 end time: 10:40  Primary concerns today: Post-operative Bariatric Surgery Nutrition Management.  Non-scale victories: ability to cross legs with ease, increased energy, improved self-esteem, more self-confidence  Surgery date: 03/29/2016 Surgery type: [facility]YGB [facility] lbs on 10/30/2015 Weight today:  207.2 lbs Wt change: 14.6 lbs loss from 221.8. (04/05/2016) Total Wt loss: 110.8 lbs loss Wt loss goals: be a runner and it not hurt to run; less than 200 lbs  TANITA  BODY COMP RESULTS  03/03/16 04/13/2016 05/25/2016 07/30/2016 11/02/2016 04/05/2017 09/23/2017   BMI (kg/m^2) 51.4 47.3 44.6 40.8 37.8 34.7 32.5   Fat Mass (lbs) 190.8 170.4 154.6 140.0 119.6 103.2 92.4   Fat Free Mass (lbs) 137.2 131.8 138.6 128.2 127.4 118.6 114.8   Total Body Water (lbs) 104.2 99.4  95.4 94.0 86.8 83.4    Pt prefers to be called "Shay".   Pt states she starts BELT program next week and plans to attend 2 days/week. Pt states she has started training for a 5K and wants to jog the event and not walk it. Pt states she does not like rice and will not eat cauliflower rice due to it looking like rice. Pt states she has been wanting crab legs lately. Pt states recent iron and Vitamin A labs were low.; currently taking [facility] multivitamin. Pt states she does not want to feel like meal prepping is a chore although she likes knowing ahead of time what to eat.   Pt states she does not remember being less than 200 lbs other than during adolescence and wants to achieve that. Pt states she thinks she is healthy because she does not have any health conditions and she has taken preventative action against diabetes and high blood pressure. Pt states she struggles with consistency in everything; reports meal planning and physical activity are also inconsistent from week to week. Pt states  she still desires to be an avid runner and improve in that area. Pt states she enjoys cauliflower pizza crust.    24-hr recall: B (AM): decaf Expresso + protein shake (30g) Snk (AM): chicken sausage (13g) + kale  L (PM): 3 oz salmon (21g) + asparagus  Snk (PM): sometimes almonds (7g)  D (PM): smart balance flatbread, 3 oz shrimp (21g) + broccoli Snk (PM): watermelon  Fluid intake: water with MIO drops, ICE, diet peak tea, unsweetened tea, and protein shake: 64+ oz Estimated total protein intake: 60+ grams   Medications: see list Supplementation: 3 calcium, [facility] capsule  Using straws: sporadically Drinking while eating: yes Having you been chewing well: yes Chewing/swallowing difficulties: no Changes in vision: no Changes to mood/headaches: no, no Hair loss/Changes to skin/Changes to nails: no, no, no Any difficulty focusing or concentrating: no Sweating: no Dizziness/Lightheaded: no Palpitations: no Carbonated beverages: carbonated water  N/V/D/C/GAS: no, no, no, no, no Abdominal Pain: no Dumping Syndrome: no  Recent physical activity:  Running 20 min, circuit and/or strength-training 2x/week   Progress Towards Goal(s):  In progress.  Handouts given during visit include:  Phase VII: Maintenance Phase   Nutritional Diagnosis:  [place]-3.3 Overweight/obesity related to past poor dietary habits and physical inactivity as evidenced by patient w/ recent RYGB surgery following dietary guidelines for continued weight loss.   Intervention:  Nutrition counseling. Pt was educated and counseled mindful eating, having a non-diet approach to eating, and encouraged to increase strength training exercises. Pt  was in agreement with goals listed.  Goals: - Make an appt with yourself for physical activity one day on the weekend: deck of cards workout, swimming, walking, jogging, etc.   Teaching Method Utilized:  Visual Auditory Hands on  Barriers to learning/adherence to  lifestyle change: none identified but wants to be less than 200 lbs  Demonstrated degree of understanding via:  Teach Back   Monitoring/Evaluation:  Dietary intake, exercise, lap band fills, and body weight. 6 months from 24 month follow-up.

## 2017-09-30 ENCOUNTER — Ambulatory Visit: Payer: No Typology Code available for payment source | Admitting: Registered"

## 2017-10-18 ENCOUNTER — Encounter: Payer: Self-pay | Admitting: Family Medicine

## 2017-10-18 ENCOUNTER — Other Ambulatory Visit: Payer: Self-pay

## 2017-10-18 ENCOUNTER — Ambulatory Visit: Payer: No Typology Code available for payment source | Admitting: Family Medicine

## 2017-10-18 VITALS — BP 120/78 | HR 62 | Temp 99.0°F | Ht 67.0 in | Wt 209.6 lb

## 2017-10-18 DIAGNOSIS — B372 Candidiasis of skin and nail: Secondary | ICD-10-CM

## 2017-10-18 DIAGNOSIS — L987 Excessive and redundant skin and subcutaneous tissue: Secondary | ICD-10-CM | POA: Diagnosis not present

## 2017-10-18 MED ORDER — KETOCONAZOLE 2 % EX CREA: 1.0000 "application " | TOPICAL_CREAM | Freq: Two times a day (BID) | CUTANEOUS | 0 refills | Status: AC

## 2017-10-18 NOTE — Progress Notes (Signed)
Subjective  CC:  Chief Complaint  Patient presents with  . Rash    under abdomen, skin folds, Tried Baby Powder, Hydrocortisone     HPI: Leah Gilmore is a 32 y.o. female who presents to the office today to address the problems listed above in the chief complaint.  Pt is s/p bariatric surgery and has lost 120#!! Doing great overall. Exercises regularly, eats well. Working with nutritionist. Happy! Now a mom/foster mom of 3 children, married. Working   Has rash beneath pannus : irritated, hyperpigmented and itching on and off for months in spite of txs mentioned in cc.   Excessive skin: painful, and interferes with exercise and running. Has back pain. Emotionally jarring due to appearance, especially in her intimate relationship.    Assessment  1. Candidal intertrigo   2. Excessive and redundant skin and subcutaneous tissue      Plan   intertigo:  Difficult to control due to excessive skin and regular exercise. rec powders. Treat with ketoconazole.   Discussed need for skin removal. Pt to think about it.  Follow up: jan 2020 for cpe  No orders of the defined types were placed in this encounter.  Meds ordered this encounter  Medications  . ketoconazole (NIZORAL) 2 % cream    Sig: Apply 1 application topically 2 (two) times daily for 7 days. Then as needed    Dispense:  30 g    Refill:  0      I reviewed the patients updated PMH, FH, and SocHx.    Patient Active Problem List   Diagnosis Date Noted  . Iron deficiency anemia 03/11/2017  . PCOS (polycystic ovarian syndrome) 03/29/2016  . 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    Allergies: Patient is allergic to azithromycin and food. Family History: Patient family history includes Alcohol abuse in her father; Cancer in her maternal grandfather; Cancer (age of onset: 3946) in her father; Diabetes in  her maternal grandmother and paternal grandmother; Drug abuse in her father; Hypertension in her paternal grandmother. Social History:  Patient  reports that she has never smoked. She has never used smokeless tobacco. She reports that she does not drink alcohol or use drugs.  Review of Systems: Constitutional: Negative for fever malaise or anorexia Cardiovascular: negative for chest pain Respiratory: negative for SOB or persistent cough Gastrointestinal: negative for abdominal pain  Objective  Vitals: BP 120/78   Pulse 62   Temp 99 F (37.2 C)   Ht 5\' 7"  (1.702 m)   Wt 209 lb 9.6 oz (95.1 kg)   LMP 10/16/2017   SpO2 98%   BMI 32.83 kg/m  General: no acute distress , A&Ox3 HEENT: PEERL, conjunctiva normal, Oropharynx moist,neck is supple Cardiovascular:  RRR without murmur or gallop.  Respiratory:  Good breath sounds bilaterally, CTAB with normal respiratory effort Skin:  Warm, pannus with intertrigo hyperpigmented rash and irritation.  Excessive skin folds, abdomen and arms and legs     Commons side effects, risks, benefits, and alternatives for medications and treatment plan prescribed today were discussed, and the patient expressed understanding of the given instructions. Patient is instructed to call or message via MyChart if he/she has any questions or concerns regarding our treatment plan. No barriers to understanding were identified. We discussed Red Flag symptoms and signs in detail. Patient expressed understanding regarding what to do in case of urgent or  emergency type symptoms.   Medication list was reconciled, printed and provided to the patient in AVS. Patient instructions and summary information was reviewed with the patient as documented in the AVS. This note was prepared with assistance of Dragon voice recognition software. Occasional wrong-word or sound-a-like substitutions may have occurred due to the inherent limitations of voice recognition software

## 2017-10-18 NOTE — Patient Instructions (Addendum)
Return for your physical in jan 2020.

## 2017-11-02 ENCOUNTER — Encounter: Payer: Self-pay | Admitting: Family Medicine

## 2017-11-02 DIAGNOSIS — L987 Excessive and redundant skin and subcutaneous tissue: Secondary | ICD-10-CM

## 2017-11-02 DIAGNOSIS — Z9884 Bariatric surgery status: Secondary | ICD-10-CM

## 2017-11-18 IMAGING — RF DG UGI W/ KUB
12 of 14 series · 12 of 24 positions shown · non-contrast
Comparison: None

CLINICAL DATA: Preoperative for bariatric surgery.

EXAM:
UPPER GI SERIES WITH KUB
TECHNIQUE: After obtaining a scout radiograph a routine upper GI series was
performed using thin barium
FLUOROSCOPY TIME:  Radiation Exposure Index (as provided by the
fluoroscopic device): 69.8 mGy

[Series 2: cp_standard · 0.34mm/px · 1 of 21 frames shown (1 of 11)]
[frame 11/21]
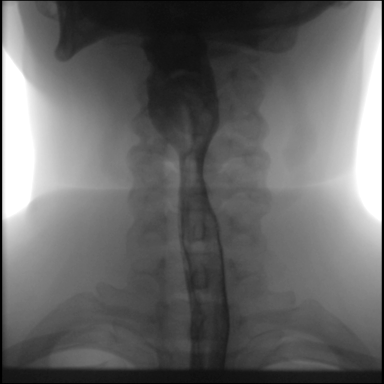

[Series 3: cp_standard · 0.34mm/px · 1 of 36 frames shown (2 of 11)]
[frame 13/36]
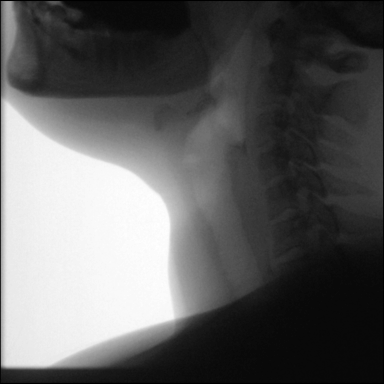

[Series 4: cp_standard · 0.35mm/px · 1 of 118 frames shown (3 of 11)]
[frame 60/118]
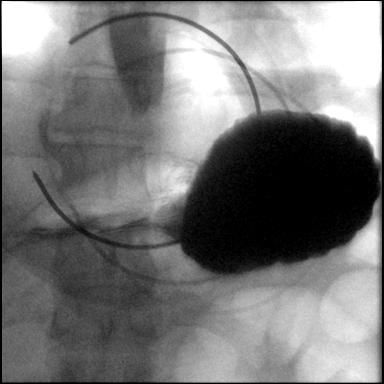

[Series 6: fluoro_barium 2fps_bw · 0.18mm/px · 1 of 1 slices shown]
[im 1/1]
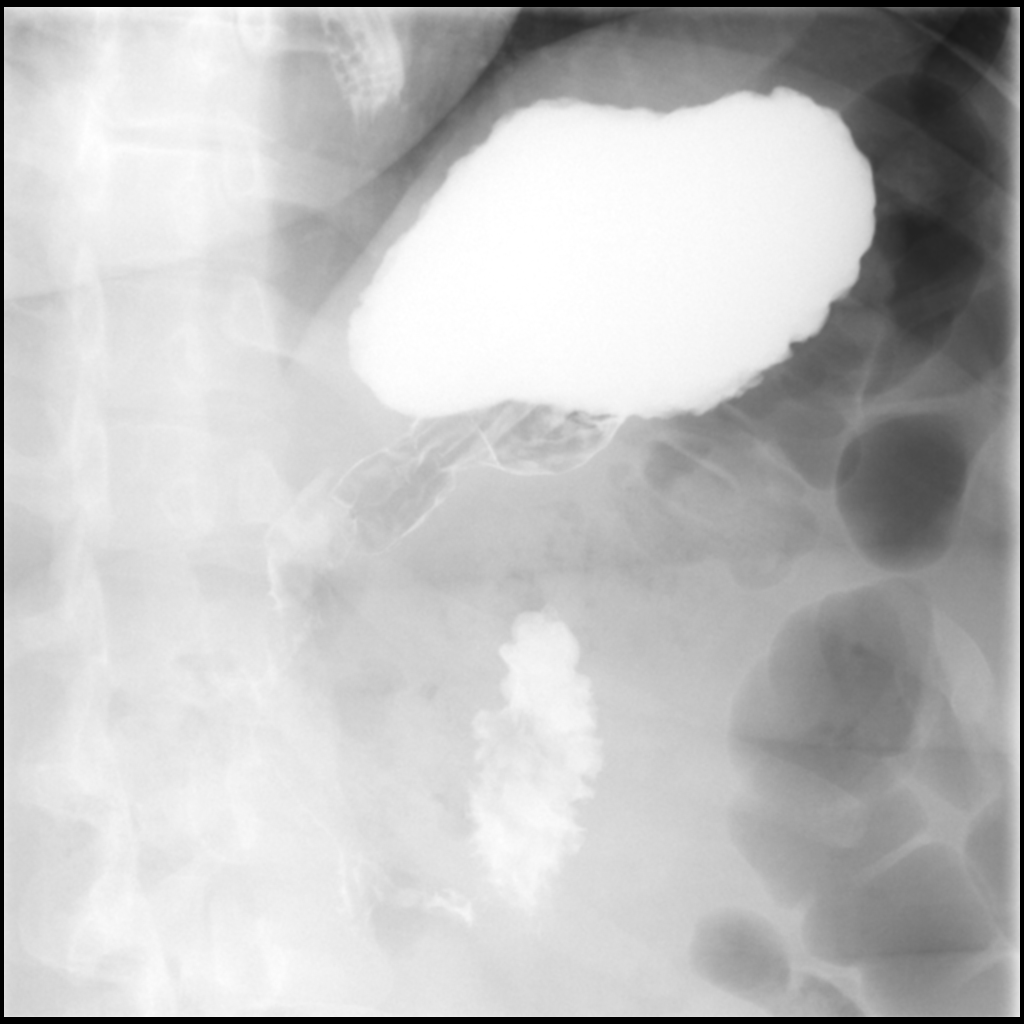

[Series 9: cp_standard · 0.36mm/px · 1 of 54 frames shown (4 of 11)]
[frame 28/54]
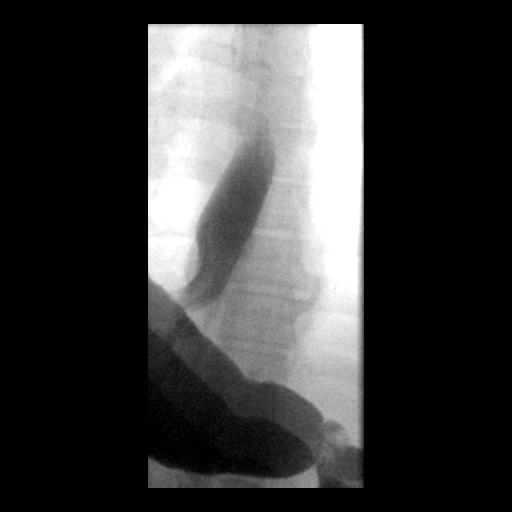

[Series 10: cp_standard · 0.36mm/px · 1 of 76 frames shown (5 of 11)]
[frame 39/76]
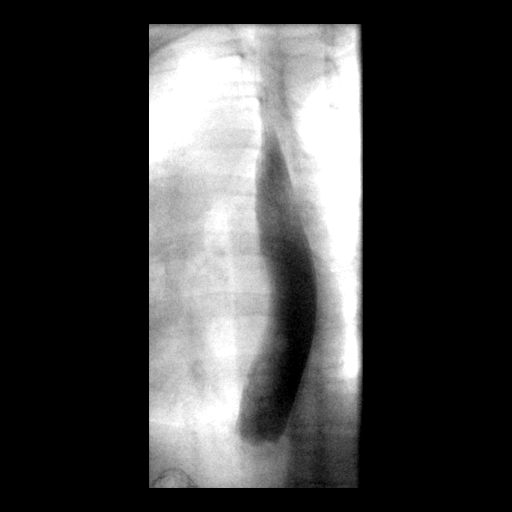

[Series 11: cp_standard · 0.36mm/px · 1 of 71 frames shown (6 of 11)]
[frame 50/71]
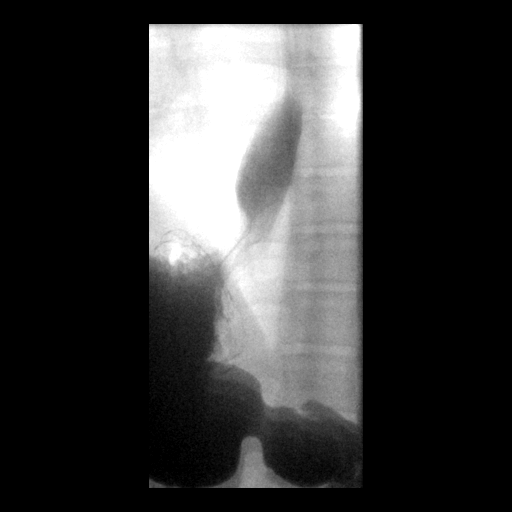

[Series 12: cp_standard · 0.36mm/px · 1 of 60 frames shown (7 of 11)]
[frame 36/60]
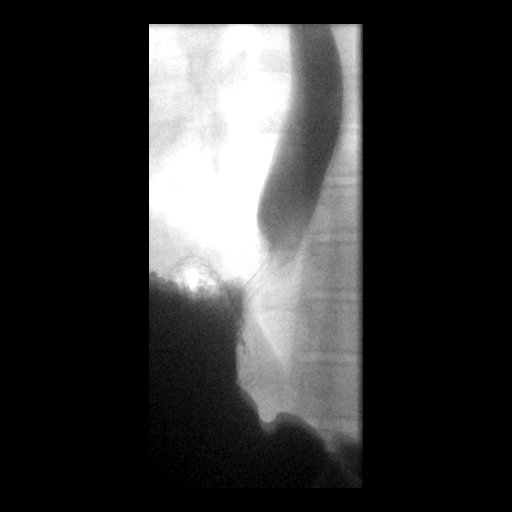

[Series 13: cp_standard · 0.36mm/px · 1 of 82 frames shown (8 of 11)]
[frame 42/82]
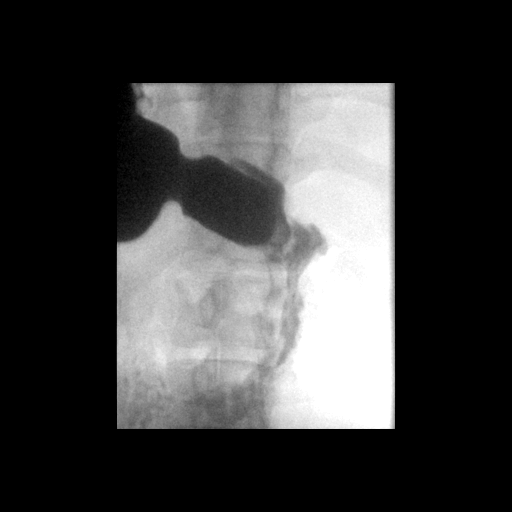

[Series 14: cp_standard · 0.36mm/px · 1 of 64 frames shown (9 of 11)]
[frame 53/64]
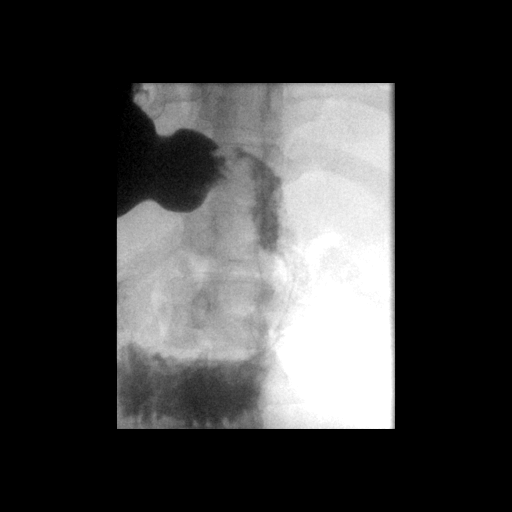

[Series 15: cp_standard · 0.54mm/px · 1 of 47 frames shown (10 of 11)]
[frame 24/47]
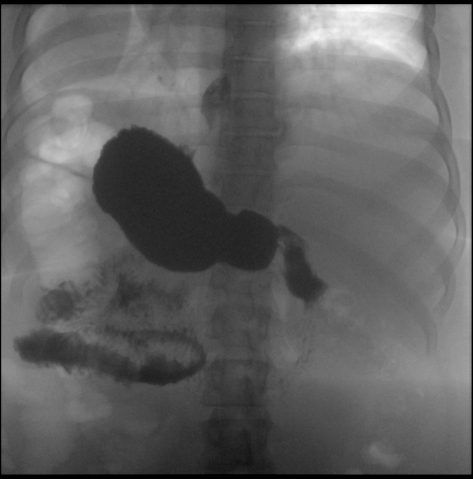

[Series 16: cp_standard · 0.36mm/px · 1 of 21 frames shown (11 of 11)]
[frame 18/21]
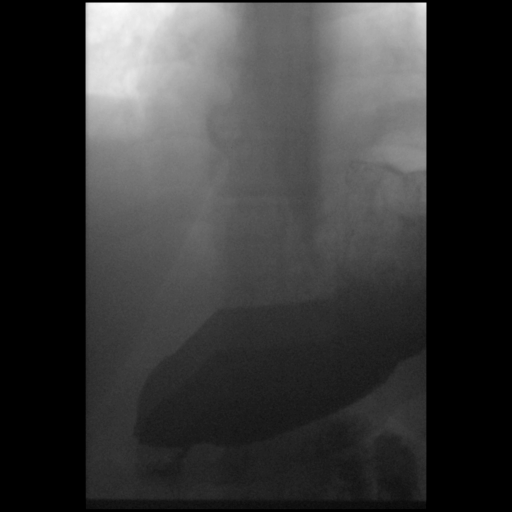

[12 of 24 positions shown; findings below may reference images not displayed]

FINDINGS: Initial KUB demonstrates normal upper abdominal bowel gas pattern.

Pharyngeal phase of swallowing normal aside from double swallowing.

Mucosal relief assessment of the stomach normal using a compression
paddle. Overall gastric morphology normal.

The duodenal bulb and duodenal sweep appear normal.

Primary peristaltic waves in the esophagus were normal on [DATE]
swallows.

A 13 mm barium pill passed into the stomach without delay.
IMPRESSION: 1. Normal upper GI examination.

## 2017-11-24 ENCOUNTER — Encounter: Payer: Self-pay | Admitting: Hematology and Oncology

## 2017-11-24 ENCOUNTER — Telehealth: Payer: Self-pay | Admitting: Hematology and Oncology

## 2017-11-24 NOTE — Telephone Encounter (Signed)
New referral received from Dr. Gaynelle AduEric Wilson from [facility] for IDA. Pt has been called and scheduled to see Dr. Pamelia HoitGudena on 10/16 at 345pm. Pt aware to arrive 30 minutes early. Letter mailed.

## 2017-12-21 ENCOUNTER — Inpatient Hospital Stay
Payer: No Typology Code available for payment source | Attending: Hematology and Oncology | Admitting: Hematology and Oncology

## 2017-12-21 VITALS — BP 130/71 | HR 79 | Temp 98.6°F | Resp 20 | Ht 67.0 in | Wt 206.1 lb

## 2017-12-21 DIAGNOSIS — D509 Iron deficiency anemia, unspecified: Secondary | ICD-10-CM | POA: Diagnosis present

## 2017-12-21 DIAGNOSIS — Z9884 Bariatric surgery status: Secondary | ICD-10-CM | POA: Diagnosis not present

## 2017-12-21 DIAGNOSIS — Z8042 Family history of malignant neoplasm of prostate: Secondary | ICD-10-CM | POA: Insufficient documentation

## 2017-12-21 DIAGNOSIS — D508 Other iron deficiency anemias: Secondary | ICD-10-CM

## 2017-12-21 DIAGNOSIS — Z801 Family history of malignant neoplasm of trachea, bronchus and lung: Secondary | ICD-10-CM | POA: Insufficient documentation

## 2017-12-21 DIAGNOSIS — N920 Excessive and frequent menstruation with regular cycle: Secondary | ICD-10-CM | POA: Insufficient documentation

## 2017-12-21 NOTE — Progress Notes (Signed)
[facility] CONSULT NOTE  Patient Care Team: [name], MD as PCP - General (Family Medicine)  CHIEF COMPLAINTS/PURPOSE OF CONSULTATION:  Iron deficiency anemia  HISTORY OF PRESENTING ILLNESS:  Leah Gilmore 32 y.o. female is here because of recent diagnosis of iron deficiency anemia with a ferritin of 9 on 11/14/2017.  Patient has had chronic iron deficiency due to heavy menstrual cycles.  She has been feeling extremely wiped out and tired lately.  Because of this iron studies were performed and she had a hemoglobin of 10 and a ferritin of 9. She has not noticed any bleeding per rectum.  She has not had a colonoscopy.  I reviewed her records extensively and collaborated the history with the patient.  MEDICAL HISTORY:  Past Medical History:  Diagnosis Date  . Anemia    Never had a blood transfusion  . Iron deficiency anemia 03/11/2017  . PCOS (polycystic ovarian syndrome) 03/29/2016  . 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 GASTRIC ROUX-EN-Y N/A 03/29/2016   Procedure: LAPAROSCOPIC ROUX-EN-Y GASTRIC BYPASS WITH UPPER ENDOSCOPY;  Surgeon: Gaynelle Adu, MD;  Location: [facility];  Service: General;  Laterality: N/A;  . UPPER GI ENDOSCOPY  03/29/2016   Procedure: UPPER GI ENDOSCOPY;  Surgeon: Gaynelle Adu, MD;  Location: [facility];  Service: General;;    SOCIAL HISTORY: Social History   Socioeconomic History  . Marital status: Married    Spouse name: Ernestine Conrad  . Number of children: 2  . Years of education: Not on file  . Highest education level: Not on file  Occupational History  . Occupation: Hydrologist, housing loans    Employer: [employer]  Social Needs  . Financial resource strain: Not on file  . Food insecurity:    Worry: Not on file    Inability: Not on file  . Transportation needs:    Medical: Not on file    Non-medical: Not on file  Tobacco Use  . Smoking  status: Never Smoker  . Smokeless tobacco: Never Used  Substance and Sexual Activity  . Alcohol use: No    Alcohol/week: 0.0 standard drinks    Comment: rarely  . Drug use: No  . Sexual activity: Yes    Birth control/protection: None  Lifestyle  . Physical activity:    Days per week: Not on file    Minutes per session: Not on file  . Stress: Not on file  Relationships  . Social connections:    Talks on phone: Not on file    Gets together: Not on file    Attends religious service: Not on file    Active member of club or organization: Not on file    Attends meetings of clubs or organizations: Not on file    Relationship status: Not on file  . Intimate partner violence:    Fear of current or ex partner: Not on file    Emotionally abused: Not on file    Physically abused: Not on file    Forced sexual activity: Not on file  Other Topics Concern  . Not on file  Social History Narrative  . Not on file    FAMILY HISTORY: Family History  Problem Relation Age of Onset  . Cancer Father 61       lung cancer, smoker  . Alcohol abuse Father   . Drug abuse Father   . Diabetes Maternal Grandmother   .  Cancer Maternal Grandfather        prostate  . Diabetes Paternal Grandmother   . Hypertension Paternal Grandmother     ALLERGIES:  is allergic to azithromycin and food.  MEDICATIONS:  Current Outpatient Medications  Medication Sig Dispense Refill  . Calcium 500 MG tablet Take 500 mg by mouth 3 (three) times daily.    . IRON PO Take by mouth.    . Multiple Vitamins-Iron (MULTIVITAMIN/IRON PO) Take 1 tablet by mouth 2 (two) times daily.     No current facility-administered medications for this visit.     REVIEW OF SYSTEMS:   Constitutional: Severe fatigue Eyes: Denies blurriness of vision, double vision or watery eyes Ears, nose, mouth, throat, and face: Denies mucositis or sore throat Respiratory: Denies cough, dyspnea or wheezes Cardiovascular: Denies palpitation, chest  discomfort or lower extremity swelling Gastrointestinal:  Denies nausea, heartburn or change in bowel habits Skin: Denies abnormal skin rashes Lymphatics: Denies new lymphadenopathy or easy bruising Neurological:Denies numbness, tingling or new weaknesses Behavioral/Psych: Mood is stable, no new changes    All other systems were reviewed with the patient and are negative.  PHYSICAL EXAMINATION: ECOG PERFORMANCE STATUS: 1 - Symptomatic but completely ambulatory  Vitals:   12/21/17 1611  BP: 130/71  Pulse: 79  Resp: 20  Temp: 98.6 F (37 C)  SpO2: 100%   Filed Weights   12/21/17 1611  Weight: 206 lb 1.6 oz (93.5 kg)    GENERAL:alert, no distress and comfortable SKIN: skin color, texture, turgor are normal, no rashes or significant lesions EYES: normal, conjunctiva are pink and non-injected, sclera clear OROPHARYNX:no exudate, no erythema and lips, buccal mucosa, and tongue normal  NECK: supple, thyroid normal size, non-tender, without nodularity LYMPH:  no palpable lymphadenopathy in the cervical, axillary or inguinal LUNGS: clear to auscultation and percussion with normal breathing effort HEART: regular rate & rhythm and no murmurs and no lower extremity edema ABDOMEN:abdomen soft, non-tender and normal bowel sounds Musculoskeletal:no cyanosis of digits and no clubbing  PSYCH: alert & oriented x 3 with fluent speech NEURO: no focal motor/sensory deficits   LABORATORY DATA:  I have reviewed the data as listed Lab Results  Component Value Date   WBC 4.2 03/11/2017   HGB 10.4 (L) 03/11/2017   HCT 32.3 (L) 03/11/2017   MCV 83.7 03/11/2017   PLT 284.0 03/11/2017   Lab Results  Component Value Date   NA 139 03/11/2017   K 4.2 03/11/2017   CL 105 03/11/2017   CO2 25 03/11/2017    RADIOGRAPHIC STUDIES: I have personally reviewed the radiological reports and agreed with the findings in the report.  ASSESSMENT AND PLAN:  Iron deficiency anemia Secondary to gastric  bypass surgery done January 2018 Also heavy menstrual cycles all her life.  She has chronic iron deficiency  Labs 11/14/2017: Ferritin 9  Recommendation: IV iron therapy with Feraheme 2 doses 1 week apart Patient will not benefit from oral iron therapy because of malabsorption from gastric bypass.  We will plan on rechecking her iron studies and CBC along with B12 and folic acid when she comes back to see [name] in 3 months.  I discussed with her that the frequency of IV iron infusion depends upon the degree of absorption as well as extent of her menstrual cycles.  Return to clinic in 3 months with labs done ahead of time.   All questions were answered. The patient knows to call the clinic with any problems, questions or concerns.  Tamsen Meek, MD 12/21/17

## 2017-12-21 NOTE — Assessment & Plan Note (Signed)
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  She has chronic iron deficiency  Labs 11/14/2017: Ferritin 9  Recommendation: IV iron therapy with Feraheme 2 doses 1 week apart Patient will not benefit from oral iron therapy because of malabsorption from gastric bypass.  We will plan on rechecking her iron studies and CBC along with B12 and folic acid when she comes back to see [name] in 3 months.  I discussed with her that the frequency of IV iron infusion depends upon the degree of absorption as well as extent of her menstrual cycles.  Return to clinic in 3 months with labs done ahead of time.

## 2017-12-23 ENCOUNTER — Inpatient Hospital Stay: Payer: No Typology Code available for payment source

## 2017-12-23 VITALS — BP 104/66 | HR 60 | Temp 99.0°F | Resp 18

## 2017-12-23 DIAGNOSIS — D500 Iron deficiency anemia secondary to blood loss (chronic): Secondary | ICD-10-CM

## 2017-12-23 DIAGNOSIS — D509 Iron deficiency anemia, unspecified: Secondary | ICD-10-CM | POA: Diagnosis not present

## 2017-12-23 MED ORDER — SODIUM CHLORIDE 0.9 % IV SOLN
510.0000 mg | Freq: Once | INTRAVENOUS | Status: AC
  Administered 2017-12-23: 510 mg via INTRAVENOUS
  Filled 2017-12-23: qty 17

## 2017-12-23 MED ORDER — SODIUM CHLORIDE 0.9 % IV SOLN
Freq: Once | INTRAVENOUS | Status: AC
  Administered 2017-12-23: 09:00:00 via INTRAVENOUS
  Filled 2017-12-23: qty 250

## 2017-12-23 NOTE — Patient Instructions (Signed)

## 2017-12-30 ENCOUNTER — Inpatient Hospital Stay (HOSPITAL_BASED_OUTPATIENT_CLINIC_OR_DEPARTMENT_OTHER): Payer: No Typology Code available for payment source | Admitting: Medical

## 2017-12-30 ENCOUNTER — Inpatient Hospital Stay: Payer: No Typology Code available for payment source

## 2017-12-30 ENCOUNTER — Other Ambulatory Visit: Payer: Self-pay | Admitting: Medical

## 2017-12-30 VITALS — BP 112/64 | HR 61 | Temp 98.8°F | Resp 18

## 2017-12-30 DIAGNOSIS — R21 Rash and other nonspecific skin eruption: Secondary | ICD-10-CM

## 2017-12-30 DIAGNOSIS — L509 Urticaria, unspecified: Secondary | ICD-10-CM

## 2017-12-30 DIAGNOSIS — D509 Iron deficiency anemia, unspecified: Secondary | ICD-10-CM | POA: Diagnosis not present

## 2017-12-30 DIAGNOSIS — D500 Iron deficiency anemia secondary to blood loss (chronic): Secondary | ICD-10-CM

## 2017-12-30 MED ORDER — SODIUM CHLORIDE 0.9 % IV SOLN
510.0000 mg | Freq: Once | INTRAVENOUS | Status: AC
  Administered 2017-12-30: 510 mg via INTRAVENOUS
  Filled 2017-12-30: qty 17

## 2017-12-30 MED ORDER — SODIUM CHLORIDE 0.9 % IV SOLN: 10.0000 mg | Freq: Once | INTRAVENOUS | Status: DC

## 2017-12-30 MED ORDER — SODIUM CHLORIDE 0.9 % IV SOLN
Freq: Once | INTRAVENOUS | Status: AC
  Administered 2017-12-30: 16:00:00 via INTRAVENOUS
  Filled 2017-12-30: qty 250

## 2017-12-30 MED ORDER — PREDNISONE 2.5 MG PO TABS: ORAL_TABLET | ORAL | 0 refills | Status: DC

## 2017-12-30 MED ORDER — DEXAMETHASONE SODIUM PHOSPHATE 10 MG/ML IJ SOLN
10.0000 mg | Freq: Once | INTRAMUSCULAR | Status: AC
  Administered 2017-12-30: 10 mg via INTRAVENOUS

## 2017-12-30 MED ORDER — HYDROXYZINE HCL 10 MG PO TABS: 10.0000 mg | ORAL_TABLET | Freq: Three times a day (TID) | ORAL | 0 refills | Status: DC | PRN

## 2017-12-30 MED ORDER — DEXAMETHASONE SODIUM PHOSPHATE 10 MG/ML IJ SOLN
INTRAMUSCULAR | Status: AC
  Filled 2017-12-30: qty 1

## 2017-12-30 NOTE — Patient Instructions (Signed)

## 2017-12-30 NOTE — Progress Notes (Signed)
The patient was seen in the infusion room.  She received IV iron last week and developed a diffuse pruritic rash over her upper extremities and back.  She has been taking Benadryl without improvement.  A prescription for Atarax and a prednisone taper was sent to her pharmacy.  Marga Hoots, MHS, PA-C Physician Assistant

## 2017-12-30 NOTE — Progress Notes (Signed)
Pt states she has had a rash on both of her arms and upper back since transfusion last Friday.  Pt wonders if rash is related to iron transfusion. Pt has not taken any over-the-counter meds for the rash. Marga Hoots, PA notified and asked to see pt.

## 2018-02-14 ENCOUNTER — Other Ambulatory Visit: Payer: Self-pay

## 2018-02-14 ENCOUNTER — Ambulatory Visit: Payer: No Typology Code available for payment source | Admitting: Family Medicine

## 2018-02-14 ENCOUNTER — Encounter: Payer: Self-pay | Admitting: Family Medicine

## 2018-02-14 VITALS — BP 128/76 | HR 66 | Temp 98.3°F | Ht 67.0 in | Wt 207.8 lb

## 2018-02-14 DIAGNOSIS — R238 Other skin changes: Secondary | ICD-10-CM

## 2018-02-14 MED ORDER — TRIAMCINOLONE ACETONIDE 0.1 % EX CREA: 1.0000 "application " | TOPICAL_CREAM | Freq: Two times a day (BID) | CUTANEOUS | 0 refills | Status: DC

## 2018-02-14 NOTE — Progress Notes (Signed)
   Subjective  CC:  Chief Complaint  Patient presents with  . Rash    under belt line, raw and burns, oozing     HPI: Leah Gilmore is a 32 y.o. female who presents to the office today to address the problems listed above in the chief complaint.  Has self treated for intertrigo with ketoconazole for the last 2 weeks. No longer itching but has some clear drainage today and is sore.    Assessment  1. Skin irritation      Plan   Skin irritation:  No sign of infection now. Treat with steroid cream , then barriers like desitin or bandage.   Follow up: cpe next month   No orders of the defined types were placed in this encounter.  Meds ordered this encounter  Medications  . triamcinolone cream (KENALOG) 0.1 %    Sig: Apply 1 application topically 2 (two) times daily. For 2 weeks, then as needed    Dispense:  45 g    Refill:  0      I reviewed the patients updated PMH, FH, and SocHx.    Patient Active Problem List   Diagnosis Date Noted  . Iron deficiency anemia 03/11/2017  . PCOS (polycystic ovarian syndrome) 03/29/2016  . 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 Calcium 500 MG tablet Take 500 mg by mouth 3 (three) times daily.  . Multiple Vitamins-Iron (MULTIVITAMIN/IRON PO) Take 1 tablet by mouth 2 (two) times daily.    Allergies: Patient is allergic to azithromycin and food. Family History: Patient family history includes Alcohol abuse in her father; Cancer in her maternal grandfather; Cancer (age of onset: 146) in her father; Diabetes in her maternal grandmother and paternal grandmother; Drug abuse in her father; Hypertension in her paternal grandmother. Social History:  Patient  reports that she has never smoked. She has never used smokeless tobacco. She reports that she does not drink alcohol or use drugs.  Review of Systems: Constitutional: Negative for fever malaise or anorexia Cardiovascular: negative for chest  pain Respiratory: negative for SOB or persistent cough Gastrointestinal: negative for abdominal pain  Objective  Vitals: BP 128/76   Pulse 66   Temp 98.3 F (36.8 C)   Ht 5\' 7"  (1.702 m)   Wt 207 lb 12.8 oz (94.3 kg)   SpO2 99%   BMI 32.55 kg/m  General: no acute distress , A&Ox3 Skin:  Warm, no rashes, suprapubic area with minimal hyperpigmentation and soreness, no drainage, warmth, flaking or erythema     Commons side effects, risks, benefits, and alternatives for medications and treatment plan prescribed today were discussed, and the patient expressed understanding of the given instructions. Patient is instructed to call or message via MyChart if he/she has any questions or concerns regarding our treatment plan. No barriers to understanding were identified. We discussed Red Flag symptoms and signs in detail. Patient expressed understanding regarding what to do in case of urgent or emergency type symptoms.   Medication list was reconciled, printed and provided to the patient in AVS. Patient instructions and summary information was reviewed with the patient as documented in the AVS. This note was prepared with assistance of Dragon voice recognition software. Occasional wrong-word or sound-a-like substitutions may have occurred due to the inherent limitations of voice recognition software

## 2018-02-14 NOTE — Patient Instructions (Signed)
Please follow up if symptoms do not improve or as needed.   Use Desitin as a protective barrier.  Use the steroid cream for when it is red and irritated.  Use a bandage as needed.

## 2018-03-16 ENCOUNTER — Encounter: Payer: Self-pay | Admitting: Family Medicine

## 2018-03-16 ENCOUNTER — Other Ambulatory Visit: Payer: Self-pay

## 2018-03-16 ENCOUNTER — Ambulatory Visit: Payer: No Typology Code available for payment source | Admitting: Family Medicine

## 2018-03-16 VITALS — BP 132/76 | HR 70 | Temp 97.9°F | Resp 16 | Ht 67.0 in | Wt 205.6 lb

## 2018-03-16 DIAGNOSIS — Z0000 Encounter for general adult medical examination without abnormal findings: Secondary | ICD-10-CM | POA: Diagnosis not present

## 2018-03-16 DIAGNOSIS — Z9884 Bariatric surgery status: Secondary | ICD-10-CM

## 2018-03-16 DIAGNOSIS — N920 Excessive and frequent menstruation with regular cycle: Secondary | ICD-10-CM | POA: Diagnosis not present

## 2018-03-16 DIAGNOSIS — D500 Iron deficiency anemia secondary to blood loss (chronic): Secondary | ICD-10-CM

## 2018-03-16 LAB — COMPREHENSIVE METABOLIC PANEL
ALT: 16 U/L (ref 0–35)
AST: 21 U/L (ref 0–37)
Albumin: 4.2 g/dL (ref 3.5–5.2)
Alkaline Phosphatase: 67 U/L (ref 39–117)
BUN: 22 mg/dL (ref 6–23)
CO2: 25 mEq/L (ref 19–32)
Calcium: 9.1 mg/dL (ref 8.4–10.5)
Chloride: 104 mEq/L (ref 96–112)
Creatinine, Ser: 0.8 mg/dL (ref 0.40–1.20)
GFR: 106.39 mL/min (ref >60.00)
Glucose, Bld: 86 mg/dL (ref 70–99)
Potassium: 4.2 mEq/L (ref 3.5–5.1)
Sodium: 136 mEq/L (ref 135–145)
Total Bilirubin: 0.9 mg/dL (ref 0.2–1.2)
Total Protein: 7.5 g/dL (ref 6.0–8.3)

## 2018-03-16 LAB — LIPID PANEL
Cholesterol: 153 mg/dL (ref 0–200)
HDL: 60.7 mg/dL (ref >39.00)
LDL Cholesterol: 83 mg/dL (ref 0–99)
NonHDL: 92.46
TRIGLYCERIDES: 48 mg/dL (ref 0.0–149.0)
Total CHOL/HDL Ratio: 3
VLDL: 9.6 mg/dL (ref 0.0–40.0)

## 2018-03-16 LAB — VITAMIN D 25 HYDROXY (VIT D DEFICIENCY, FRACTURES): VITD: 23.13 ng/mL — ABNORMAL LOW (ref 30.00–100.00)

## 2018-03-16 LAB — TSH: TSH: 0.81 u[IU]/mL (ref 0.35–4.50)

## 2018-03-16 NOTE — Progress Notes (Signed)
Subjective  Chief Complaint  Patient presents with  . Annual Exam    HPI: Leah Gilmore is a 33 y.o. female who presents to [facility] today for a Female Wellness Visit.   Wellness Visit: annual visit with health maintenance review and exam without Pap   33 year old female here for annual exam without Pap smear.  Doing very well.  Status post gastric bypass and continue to losing weight.  Exercises regularly.  Energy levels are great.  She did have iron deficiency anemia with a ferritin down to 9 that was recently treated with IV iron infusion about 2 to 3 months ago.  Felt better initially but starting to drag a little bit again.  Continues to have heavy regular menstrual cycles, requiring multiple pads and tampons with passage of clots lasting 5 days.  She does have PCOS.  She is not on any current birth control hormone manipulation.  She is skeptical due to possible weight gain.  Assessment  1. Annual physical exam   2. S/P gastric bypass   3. Iron deficiency anemia due to chronic blood loss      Plan  Female Wellness Visit:  Age appropriate Health Maintenance and Prevention measures were discussed with patient. Included topics are cancer screening recommendations, ways to keep healthy (see AVS) including dietary and exercise recommendations, regular eye and dental care, use of seat belts, and avoidance of moderate alcohol use and tobacco use.   BMI: discussed patient's BMI and encouraged positive lifestyle modifications to help get to or maintain a target BMI.  HM needs and immunizations were addressed and ordered. See below for orders. See HM and immunization section for updates.  Routine labs and screening tests ordered including cmp, cbc and lipids where appropriate.  Discussed recommendations regarding Vit D and calcium supplementation (see AVS)  Iron deficiency anemia due to chronic blood loss: Recheck iron levels today.  Follow-up  with heme as recommended  Status post gastric bypass: Recheck vitamin levels.  Continued weight loss.  Menorrhagia with PCOS: Recommend Mirena IUD.  Counseling and education given.  Patient to schedule during next menses.  Will order.  See after visit summary  Follow up: Return in about 1 year (around 03/17/2019) for complete physical.   Orders Placed This Encounter  Procedures  . Anemia Profile B  . VITAMIN D 25 Hydroxy (Vit-D Deficiency, Fractures)  . Comprehensive metabolic panel  . Lipid panel  . TSH   No orders of the defined types were placed in this encounter.     Lifestyle: Body mass index is 32.2 kg/m. Wt Readings from Last 3 Encounters:  03/16/18 205 lb 9.6 oz (93.3 kg)  02/14/18 207 lb 12.8 oz (94.3 kg)  12/21/17 206 lb 1.6 oz (93.5 kg)     Patient Active Problem List   Diagnosis Date Noted  . Iron deficiency anemia 03/11/2017  . PCOS (polycystic ovarian syndrome) 03/29/2016  . 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 PAP SMEAR-Modifier  03/11/2020  . TETANUS/TDAP  10/11/2023  . INFLUENZA VACCINE  Completed  . HIV Screening  Completed   Immunization History  Administered Date(s) Administered  . Influenza, Seasonal, Injecte, Preservative Fre 11/20/2014  . Influenza,inj,Quad PF,6+ Mos 10/31/2016, 12/16/2017  . Influenza-Unspecified 11/29/2016  . Tdap 10/10/2013   We updated and reviewed the patient's past history in detail and it is documented below. Allergies: Patient is allergic to azithromycin and food. Past Medical History Patient  has  a past medical history of Anemia, Iron deficiency anemia (03/11/2017), PCOS (polycystic ovarian syndrome) (03/29/2016), and S/P gastric bypass (03/29/2016). 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 Family History: Patient family history includes Alcohol abuse in her father; Cancer in her  maternal grandfather; Cancer (age of onset: 69) in her father; Diabetes in her maternal grandmother and paternal grandmother; Drug abuse in her father; Hypertension in her paternal grandmother. Social History:  Patient  reports that she has never smoked. She has never used smokeless tobacco. She reports that she does not drink alcohol or use drugs.  Review of Systems: Constitutional: negative for fever or malaise Ophthalmic: negative for photophobia, double vision or loss of vision Cardiovascular: negative for chest pain, dyspnea on exertion, or new LE swelling Respiratory: negative for SOB or persistent cough Gastrointestinal: negative for abdominal pain, change in bowel habits or melena Genitourinary: negative for dysuria or gross hematuria, no abnormal uterine bleeding or disharge Musculoskeletal: negative for new gait disturbance or muscular weakness Integumentary: negative for new or persistent rashes, no breast lumps Neurological: negative for TIA or stroke symptoms Psychiatric: negative for SI or delusions Allergic/Immunologic: negative for hives Patient Care Team    Relationship Specialty Notifications Start End  [name], MD PCP - General Family Medicine  03/23/16     Objective  Vitals: Resp 16   Ht 5\' 7"  (1.702 m)   Wt 205 lb 9.6 oz (93.3 kg)   LMP 03/16/2018   BMI 32.20 kg/m  General:  Well developed, well nourished, no acute distress  Psych:  Alert and orientedx3,normal mood and affect HEENT:  Normocephalic, atraumatic, non-icteric sclera, PERRL, oropharynx is clear without mass or exudate, supple neck without adenopathy, mass or thyromegaly Cardiovascular:  Normal S1, S2, RRR without gallop, rub or murmur, nondisplaced PMI Respiratory:  Good breath sounds bilaterally, CTAB with normal respiratory effort Gastrointestinal: normal bowel sounds, soft, non-tender, no noted masses. No HSM MSK: no deformities, contusions. Joints are without erythema or swelling. Spine  and CVA region are nontender Skin:  Warm, no rashes or suspicious lesions noted Neurologic:    Mental status is normal. CN 2-11 are normal. Gross motor and sensory exams are normal. Normal gait. No tremor Breast Exam: No mass, skin retraction or nipple discharge is appreciated in either breast. No axillary adenopathy. Fibrocystic changes are not noted    Commons side effects, risks, benefits, and alternatives for medications and treatment plan prescribed today were discussed, and the patient expressed understanding of the given instructions. Patient is instructed to call or message via MyChart if he/she has any questions or concerns regarding our treatment plan. No barriers to understanding were identified. We discussed Red Flag symptoms and signs in detail. Patient expressed understanding regarding what to do in case of urgent or emergency type symptoms.   Medication list was reconciled, printed and provided to the patient in AVS. Patient instructions and summary information was reviewed with the patient as documented in the AVS. This note was prepared with assistance of Dragon voice recognition software. Occasional wrong-word or sound-a-like substitutions may have occurred due to the inherent limitations of voice recognition software

## 2018-03-16 NOTE — Patient Instructions (Addendum)
Please return in 12 months for your annual complete physical; please come fasting.  We will call you for IUD insertion for next month.  If you have any questions or concerns, please don't hesitate to send me a message via MyChart or call the office at [phone]. Thank you for visiting with Leah Gilmore today! It's our pleasure caring for you.  Levonorgestrel intrauterine device (IUD) Mirena IUD What is this medicine? LEVONORGESTREL IUD (LEE voe nor jes trel) is a contraceptive (birth control) device. The device is placed inside the uterus by a healthcare professional. It is used to prevent pregnancy. This device can also be used to treat heavy bleeding that occurs during your period. This medicine may be used for other purposes; ask your health care provider or pharmacist if you have questions. COMMON BRAND NAME(S): [name] What should I tell my health care provider before I take this medicine? They need to know if you have any of these conditions: -abnormal Pap smear -cancer of the breast, uterus, or cervix -diabetes -endometritis -genital or pelvic infection now or in the past -have more than one sexual partner or your partner has more than one partner -heart disease -history of an ectopic or tubal pregnancy -immune system problems -IUD in place -liver disease or tumor -problems with blood clots or take blood-thinners -seizures -use intravenous drugs -uterus of unusual shape -vaginal bleeding that has not been explained -an unusual or allergic reaction to levonorgestrel, other hormones, silicone, or polyethylene, medicines, foods, dyes, or preservatives -pregnant or trying to get pregnant -breast-feeding How should I use this medicine? This device is placed inside the uterus by a health care professional. Talk to your pediatrician regarding the use of this medicine in children. Special care may be needed. Overdosage: If you think you have taken too much of this  medicine contact a poison control center or emergency room at once. NOTE: This medicine is only for you. Do not share this medicine with others. What if I miss a dose? This does not apply. Depending on the brand of device you have inserted, the device will need to be replaced every 3 to 5 years if you wish to continue using this type of birth control. What may interact with this medicine? Do not take this medicine with any of the following medications: -amprenavir -bosentan -fosamprenavir This medicine may also interact with the following medications: -aprepitant -armodafinil -barbiturate medicines for inducing sleep or treating seizures -bexarotene -boceprevir -griseofulvin -medicines to treat seizures like carbamazepine, ethotoin, felbamate, oxcarbazepine, phenytoin, topiramate -modafinil -pioglitazone -rifabutin -rifampin -rifapentine -some medicines to treat HIV infection like atazanavir, efavirenz, indinavir, lopinavir, nelfinavir, tipranavir, ritonavir -St. John's wort -warfarin This list may not describe all possible interactions. Give your health care provider a list of all the medicines, herbs, non-prescription drugs, or dietary supplements you use. Also tell them if you smoke, drink alcohol, or use illegal drugs. Some items may interact with your medicine. What should I watch for while using this medicine? Visit your doctor or health care professional for regular check ups. See your doctor if you or your partner has sexual contact with others, becomes HIV positive, or gets a sexual transmitted disease. This product does not protect you against HIV infection (AIDS) or other sexually transmitted diseases. You can check the placement of the IUD yourself by reaching up to the top of your vagina with clean fingers to feel the threads. Do not pull on the threads. It is a good habit to check placement after  each menstrual period. Call your doctor right away if you feel more of the IUD  than just the threads or if you cannot feel the threads at all. The IUD may come out by itself. You may become pregnant if the device comes out. If you notice that the IUD has come out use a backup birth control method like condoms and call your health care provider. Using tampons will not change the position of the IUD and are okay to use during your period. This IUD can be safely scanned with magnetic resonance imaging (MRI) only under specific conditions. Before you have an MRI, tell your healthcare provider that you have an IUD in place, and which type of IUD you have in place. What side effects may I notice from receiving this medicine? Side effects that you should report to your doctor or health care professional as soon as possible: -allergic reactions like skin rash, itching or hives, swelling of the face, lips, or tongue -fever, flu-like symptoms -genital sores -high blood pressure -no menstrual period for 6 weeks during use -pain, swelling, warmth in the leg -pelvic pain or tenderness -severe or sudden headache -signs of pregnancy -stomach cramping -sudden shortness of breath -trouble with balance, talking, or walking -unusual vaginal bleeding, discharge -yellowing of the eyes or skin Side effects that usually do not require medical attention (report to your doctor or health care professional if they continue or are bothersome): -acne -breast pain -change in sex drive or performance -changes in weight -cramping, dizziness, or faintness while the device is being inserted -headache -irregular menstrual bleeding within first 3 to 6 months of use -nausea This list may not describe all possible side effects. Call your doctor for medical advice about side effects. You may report side effects to FDA at 1-800-FDA-1088. Where should I keep my medicine? This does not apply. NOTE: This sheet is a summary. It may not cover all possible information. If you have questions about this  medicine, talk to your doctor, pharmacist, or health care provider.  2019 Elsevier/Gold Standard (2015-12-05 14:14:56)  Please do these things to maintain good health!   Exercise at least 30-45 minutes a day,  4-5 days a week.   Eat a low-fat diet with lots of fruits and vegetables, up to 7-9 servings per day.  Drink plenty of water daily. Try to drink 8 8oz glasses per day.  Seatbelts can save your life. Always wear your seatbelt.  Place Smoke Detectors on every level of your home and check batteries every year.  Schedule an appointment with an eye doctor for an eye exam every 1-2 years  Safe sex - use condoms to protect yourself from STDs if you could be exposed to these types of infections. Use birth control if you do not want to become pregnant and are sexually active.  Avoid heavy alcohol use. If you drink, keep it to less than 2 drinks/day and not every day.  Health Care Power of Attorney.  Choose someone you trust that could speak for you if you became unable to speak for yourself.  Depression is common in our stressful world.If you're feeling down or losing interest in things you normally enjoy, please come in for a visit.  If anyone is threatening or hurting you, please get help. Physical or Emotional Violence is never OK.

## 2018-03-17 LAB — CBC WITH DIFFERENTIAL/PLATELET
Absolute Monocytes: 333 cells/uL (ref 200–950)
Basophils Absolute: 32 cells/uL (ref 0–200)
Basophils Relative: 0.9 %
Eosinophils Absolute: 60 cells/uL (ref 15–500)
Eosinophils Relative: 1.7 %
HCT: 35.2 % (ref 35.0–45.0)
Hemoglobin: 11.5 g/dL — ABNORMAL LOW (ref 11.7–15.5)
Lymphs Abs: 1575 cells/uL (ref 850–3900)
MCH: 29.5 pg (ref 27.0–33.0)
MCHC: 32.7 g/dL (ref 32.0–36.0)
MCV: 90.3 fL (ref 80.0–100.0)
MPV: 12.3 fL (ref 7.5–12.5)
Monocytes Relative: 9.5 %
Neutro Abs: 1502 cells/uL (ref 1500–7800)
Neutrophils Relative %: 42.9 %
PLATELETS: 308 10*3/uL (ref 140–400)
RBC: 3.9 10*6/uL (ref 3.80–5.10)
RDW: 12.2 % (ref 11.0–15.0)
TOTAL LYMPHOCYTE: 45 %
WBC: 3.5 10*3/uL — ABNORMAL LOW (ref 3.8–10.8)

## 2018-03-17 LAB — VITAMIN B12: Vitamin B-12: 1009 pg/mL (ref 200–1100)

## 2018-03-17 LAB — RETICULOCYTES
ABS Retic: 35100 cells/uL (ref 20000–8000)
Retic Ct Pct: 0.9 %

## 2018-03-17 LAB — TIQ- AMBIGUOUS ORDER

## 2018-03-17 LAB — IRON, TOTAL/TOTAL IRON BINDING CAP
%SAT: 13 % (calc) — ABNORMAL LOW (ref 16–45)
Iron: 38 ug/dL — ABNORMAL LOW (ref 40–190)
TIBC: 288 mcg/dL (calc) (ref 250–450)

## 2018-03-17 LAB — FOLATE: Folate: 14.5 ng/mL

## 2018-03-17 LAB — FERRITIN: Ferritin: 101 ng/mL (ref 16–154)

## 2018-03-20 ENCOUNTER — Telehealth: Payer: Self-pay | Admitting: *Deleted

## 2018-03-20 NOTE — Telephone Encounter (Signed)
Called [employer] to verify benefits, Mirena is covered at 100%. Patient is aware.

## 2018-03-22 ENCOUNTER — Encounter: Payer: Self-pay | Admitting: *Deleted

## 2018-03-31 ENCOUNTER — Inpatient Hospital Stay: Payer: No Typology Code available for payment source | Admitting: Hematology and Oncology

## 2018-03-31 ENCOUNTER — Encounter: Payer: Self-pay | Admitting: Dietician

## 2018-03-31 ENCOUNTER — Encounter: Payer: No Typology Code available for payment source | Attending: Family Medicine | Admitting: Dietician

## 2018-03-31 ENCOUNTER — Ambulatory Visit: Payer: No Typology Code available for payment source | Admitting: Registered"

## 2018-03-31 VITALS — Wt 201.6 lb

## 2018-03-31 DIAGNOSIS — E669 Obesity, unspecified: Secondary | ICD-10-CM | POA: Diagnosis present

## 2018-03-31 NOTE — Progress Notes (Signed)
Bariatric Follow-Up Visit Medical Nutrition Therapy  Appt Start Time: 11:30am End Time: 12:10pm  24 Months Post-Operative RYGB Surgery  Primary Concerns Today: weight loss rate, calorie intake, body composition    NUTRITION ASSESSMENT  Anthropometrics  Start weight at [facility]: 318 lbs (date: 10/30/2015) Surgery date: 03/29/2016 Today's weight: 201.6 lbs Weight change: -6 lbs (since previous visit on: 09/23/2017) Total weight loss: 116.4 lbs  Wt loss goals: To be a runner and it not hurt to run. To weigh less than 200 lbs. Pt states she would be satisfied with weighing 199 lbs, but weight seems to "stick" around 199 to 201 lbs. Pt states she is concerned with her slow weight loss rate, and new goal is to lose about 20 more lbs.   TANITA  BODY COMP RESULTS  03/03/16 04/13/2016 05/25/2016 07/30/2016 11/02/2016 04/05/2017 09/23/2017 03/31/2018   BMI (kg/m^2) 51.4 47.3 44.6 40.8 37.8 34.7 32.5 31.6   Fat Mass (lbs) 190.8 170.4 154.6 140.0 119.6 103.2 92.4 87.4   Fat Free Mass (lbs) 137.2 131.8 138.6 128.2 127.4 118.6 114.8 114.2   Total Body Water (lbs) 104.2 99.4  95.4 94.0 86.8 83.4 82.6   Psychosocial / Lifestyle  Pt prefers to be called "Leah Gilmore". Pt lives with her wife and their 3 children. Pt has participated in the BELT program since July/August of 2019. Pt states she likes numbers because they help her with goal setting and having a peace of mind that she is/is not "on the right track."  Non-scale victories: ability to cross legs with ease, increased energy, improved self-esteem, more self-confidence.  24-Hr Dietary Recall First Meal: Premier Protein shake (30g) + coffee Snack: hard boiled eggs (14g)  Second Meal: zucchini boats (zucchini + ground chicken (7g) + spinach + sauce) Snack: none stated  Third Meal: cauliflower rice + grilled chicken breast (21g) + zero calorie sauce + asparagus   Food & Nutrition Related Hx Dietary Hx: Pt states she meals preps. Pt states she dislikes  rice, quinoa, sweet potatoes, bananas, and most fruits in general. Pt states she had an iron infusion recently, and has a hx of iron deficiency. Pt states she drinks a Premier Protein shake daily. Pt states fluid intake is not a problem. Pt states she is satisfied with losing weight slowly over time but is concerned because she thinks it is very slow (most recent: 6 lb loss in 6 months.) Pt asked RD about calorie intake and requested the recommended intake range she should be consuming. RD counseled pt on finding balance between eating enough to satisfy yet not too much to the point of "fullness." Pt states she understands and does eat mindfully, and can recognize by looking at her plate when her portions are too big for her stomach. Pt states she does not track her food intake, so RD encouraged pt to start tracking a few days of intake to see where she is at currently.  Supplements: 3 calcium + bariatric 1/day with iron  Estimated Daily Fluid Intake: 64+ oz Estimated Daily Protein Intake: 60+ g  Physical Activity  Current average weekly physical activity: BELT   Post-Op Goals Using straws: yes Drinking while eating: no Chewing/swallowing difficulties: no Changes in vision: no Changes to mood/headaches: no Hair loss/changes to skin/nails: no Difficulty focusing/concentrating: no Sweating: no Dizziness/lightheadedness: no Palpitations: no  Carbonated/caffeinated beverages: sometimes N/V/D/C/Gas: no Abdominal pain: no Dumping syndrome: yes (not in a while, would happen sporadically if ate the wrong thing) Last Lap-Band fill: N/A   NUTRITION DIAGNOSIS  Overweight/obesity (-3.3) related to past poor dietary habits and physical inactivity as evidenced by patient w/ completed RYGB surgery following dietary guidelines for continued weight loss.   NUTRITION INTERVENTION Nutrition counseling (C-1) and education (E-2) to facilitate bariatric surgery goals, including: . Diet education for  maintaining lifestyle habits patient has achieved thus far, including mindful eating, portion control, healthful food choices, and physical activity . The importance of consuming adequate calories as well as certain nutrients daily due to the body's need for essential vitamins, minerals, and fats . The importance of daily physical activity and to reach a goal of at least 150 minutes of moderate to vigorous physical activity weekly (or as directed by their physician) due to benefits such as increased musculature and improved lab values  Handouts Provided Include   Bariatric Meal Ideas + Snack Ideas  Bariatric Tips for Eating Out   Mindful Eating: Should I Eat?  Sample Menu Plans (for Weight Loss Surgery Pts)   Bariatric Resources: Apps & Websites  Learning Style & Readiness for Change Teaching method utilized: Visual & Auditory  Demonstrated degree of understanding via: Teach Back  Barriers to learning/adherence to lifestyle change: None Identified  RD's Notes for Next Visit . Ask about tracking foods to see if that helped determine approximate energy intake. Revisit estimated energy needs.  . Remind pt about mindful eating and encourage to continue consuming "enough" at a time and not "too much."  . Check in with physical activity progress.    MONITORING & EVALUATION Dietary intake, weekly physical activity, and body weight in 1 year.  Next Steps Patient is to follow-up in 1 year for 3 year post-op visit.

## 2018-04-17 ENCOUNTER — Encounter: Payer: Self-pay | Admitting: Family Medicine

## 2018-04-17 ENCOUNTER — Ambulatory Visit: Payer: No Typology Code available for payment source | Admitting: Family Medicine

## 2018-04-17 ENCOUNTER — Other Ambulatory Visit: Payer: Self-pay

## 2018-04-17 VITALS — BP 108/64 | HR 66 | Temp 98.8°F | Resp 16 | Ht 67.0 in | Wt 202.6 lb

## 2018-04-17 DIAGNOSIS — N920 Excessive and frequent menstruation with regular cycle: Secondary | ICD-10-CM | POA: Diagnosis not present

## 2018-04-17 DIAGNOSIS — D500 Iron deficiency anemia secondary to blood loss (chronic): Secondary | ICD-10-CM | POA: Diagnosis not present

## 2018-04-17 DIAGNOSIS — Z975 Presence of (intrauterine) contraceptive device: Secondary | ICD-10-CM | POA: Insufficient documentation

## 2018-04-17 DIAGNOSIS — E282 Polycystic ovarian syndrome: Secondary | ICD-10-CM

## 2018-04-17 NOTE — Patient Instructions (Addendum)
Please return in 3 months to recheck IUD and menstrual cycles, anemia.   If you have any questions or concerns, please don't hesitate to send me a message via MyChart or call the office at [phone]. Thank you for visiting with [name] today! It's our pleasure caring for you.  Levonorgestrel intrauterine device (IUD) What is this medicine? LEVONORGESTREL IUD (LEE voe nor jes trel) is a contraceptive (birth control) device. The device is placed inside the uterus by a healthcare professional. It is used to prevent pregnancy. This device can also be used to treat heavy bleeding that occurs during your period. This medicine may be used for other purposes; ask your health care provider or pharmacist if you have questions. COMMON BRAND NAME(S): [name] What should I tell my health care provider before I take this medicine? They need to know if you have any of these conditions: -abnormal Pap smear -cancer of the breast, uterus, or cervix -diabetes -endometritis -genital or pelvic infection now or in the past -have more than one sexual partner or your partner has more than one partner -heart disease -history of an ectopic or tubal pregnancy -immune system problems -IUD in place -liver disease or tumor -problems with blood clots or take blood-thinners -seizures -use intravenous drugs -uterus of unusual shape -vaginal bleeding that has not been explained -an unusual or allergic reaction to levonorgestrel, other hormones, silicone, or polyethylene, medicines, foods, dyes, or preservatives -pregnant or trying to get pregnant -breast-feeding How should I use this medicine? This device is placed inside the uterus by a health care professional. Talk to your pediatrician regarding the use of this medicine in children. Special care may be needed. Overdosage: If you think you have taken too much of this medicine contact a poison control center or emergency room at once. NOTE:  This medicine is only for you. Do not share this medicine with others. What if I miss a dose? This does not apply. Depending on the brand of device you have inserted, the device will need to be replaced every 3 to 5 years if you wish to continue using this type of birth control. What may interact with this medicine? Do not take this medicine with any of the following medications: -amprenavir -bosentan -fosamprenavir This medicine may also interact with the following medications: -aprepitant -armodafinil -barbiturate medicines for inducing sleep or treating seizures -bexarotene -boceprevir -griseofulvin -medicines to treat seizures like carbamazepine, ethotoin, felbamate, oxcarbazepine, phenytoin, topiramate -modafinil -pioglitazone -rifabutin -rifampin -rifapentine -some medicines to treat HIV infection like atazanavir, efavirenz, indinavir, lopinavir, nelfinavir, tipranavir, ritonavir -St. John's wort -warfarin This list may not describe all possible interactions. Give your health care provider a list of all the medicines, herbs, non-prescription drugs, or dietary supplements you use. Also tell them if you smoke, drink alcohol, or use illegal drugs. Some items may interact with your medicine. What should I watch for while using this medicine? Visit your doctor or health care professional for regular check ups. See your doctor if you or your partner has sexual contact with others, becomes HIV positive, or gets a sexual transmitted disease. This product does not protect you against HIV infection (AIDS) or other sexually transmitted diseases. You can check the placement of the IUD yourself by reaching up to the top of your vagina with clean fingers to feel the threads. Do not pull on the threads. It is a good habit to check placement after each menstrual period. Call your doctor right away if you feel more of  the IUD than just the threads or if you cannot feel the threads at all. The IUD  may come out by itself. You may become pregnant if the device comes out. If you notice that the IUD has come out use a backup birth control method like condoms and call your health care provider. Using tampons will not change the position of the IUD and are okay to use during your period. This IUD can be safely scanned with magnetic resonance imaging (MRI) only under specific conditions. Before you have an MRI, tell your healthcare provider that you have an IUD in place, and which type of IUD you have in place. What side effects may I notice from receiving this medicine? Side effects that you should report to your doctor or health care professional as soon as possible: -allergic reactions like skin rash, itching or hives, swelling of the face, lips, or tongue -fever, flu-like symptoms -genital sores -high blood pressure -no menstrual period for 6 weeks during use -pain, swelling, warmth in the leg -pelvic pain or tenderness -severe or sudden headache -signs of pregnancy -stomach cramping -sudden shortness of breath -trouble with balance, talking, or walking -unusual vaginal bleeding, discharge -yellowing of the eyes or skin Side effects that usually do not require medical attention (report to your doctor or health care professional if they continue or are bothersome): -acne -breast pain -change in sex drive or performance -changes in weight -cramping, dizziness, or faintness while the device is being inserted -headache -irregular menstrual bleeding within first 3 to 6 months of use -nausea This list may not describe all possible side effects. Call your doctor for medical advice about side effects. You may report side effects to FDA at 1-800-FDA-1088. Where should I keep my medicine? This does not apply. NOTE: This sheet is a summary. It may not cover all possible information. If you have questions about this medicine, talk to your doctor, pharmacist, or health care provider.  2019  Elsevier/Gold Standard (2015-12-05 14:14:56)

## 2018-04-17 NOTE — Progress Notes (Signed)
Subjective  CC:  Chief Complaint  Patient presents with  . Menstrual Problem    menorrhagia and anemia, for mirena IUD placment    HPI: Leah Gilmore is a 33 y.o. female who presents to the office today to address the problems listed above in the chief complaint.  Was to have IUD placed today for menorrhagia and h/o iron def anemia requiring iron infusion due to gastric bypass status. However, she is very anxious about and has multiple questions. Worries about weight gain and side effects. Does not need it for birth control. G0  Assessment  1. Menorrhagia with regular cycle   2. Iron deficiency anemia due to chronic blood loss   3. PCOS (polycystic ovarian syndrome)      Plan   menorrhagia:  Has had nausea with COCs in the past. Still uncertain about IUD so will hold off on placement today. Gave pt information. Continue iron supplements and we will monitor anemia over time. She may return if she decides she wants the Mirena in the future. She is a good candidate.   Follow up: Return in about 3 months (around 07/16/2018) for recheck.  Visit date not found  Orders Placed This Encounter  Procedures  . POCT urine pregnancy   No orders of the defined types were placed in this encounter.     I reviewed the patients updated PMH, FH, and SocHx.    Patient Active Problem List   Diagnosis Date Noted  . IUD (intrauterine device) in place 04/17/2018  . Iron deficiency anemia 03/11/2017  . PCOS (polycystic ovarian syndrome) 03/29/2016  . 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 Calcium 500 MG tablet Take 500 mg by mouth 3 (three) times daily.  . Multiple Vitamins-Iron (MULTIVITAMIN/IRON PO) Take 1 tablet by mouth 2 (two) times daily.    Allergies: Patient is allergic to azithromycin and food. Family History: Patient family history includes Alcohol abuse in her father; Cancer in her maternal grandfather; Cancer (age of onset: 45) in her father;  Diabetes in her maternal grandmother and paternal grandmother; Drug abuse in her father; Hypertension in her paternal grandmother. Social History:  Patient  reports that she has never smoked. She has never used smokeless tobacco. She reports that she does not drink alcohol or use drugs.  Review of Systems: Constitutional: Negative for fever malaise or anorexia Cardiovascular: negative for chest pain Respiratory: negative for SOB or persistent cough Gastrointestinal: negative for abdominal pain  Objective  Vitals: BP 108/64   Pulse 66   Temp 98.8 F (37.1 C) (Oral)   Resp 16   Ht 5\' 7"  (1.702 m)   Wt 202 lb 9.6 oz (91.9 kg)   LMP 03/20/2018   SpO2 98%   BMI 31.73 kg/m  General: no acute distress , A&Ox3 Psych: anxious     Commons side effects, risks, benefits, and alternatives for medications and treatment plan prescribed today were discussed, and the patient expressed understanding of the given instructions. Patient is instructed to call or message via MyChart if he/she has any questions or concerns regarding our treatment plan. No barriers to understanding were identified. We discussed Red Flag symptoms and signs in detail. Patient expressed understanding regarding what to do in case of urgent or emergency type symptoms.   Medication list was reconciled, printed and provided to the patient in AVS. Patient instructions and summary information was reviewed with the patient as documented in the AVS. This note was prepared with  assistance of Systems analyst. Occasional wrong-word or sound-a-like substitutions may have occurred due to the inherent limitations of voice recognition software

## 2018-04-21 ENCOUNTER — Encounter: Payer: Self-pay | Admitting: Plastic Surgery

## 2018-04-21 ENCOUNTER — Ambulatory Visit: Payer: No Typology Code available for payment source | Admitting: Plastic Surgery

## 2018-04-21 VITALS — BP 126/78 | HR 81 | Temp 98.6°F | Ht 67.0 in | Wt 202.0 lb

## 2018-04-21 DIAGNOSIS — M793 Panniculitis, unspecified: Secondary | ICD-10-CM | POA: Diagnosis not present

## 2018-04-21 DIAGNOSIS — G8929 Other chronic pain: Secondary | ICD-10-CM | POA: Diagnosis not present

## 2018-04-21 DIAGNOSIS — M545 Low back pain: Secondary | ICD-10-CM

## 2018-04-21 NOTE — Progress Notes (Signed)
Patient ID: Leah Gilmore, female    DOB: 05/24/1985, 33 y.o.   MRN: [id number]   Chief Complaint  Patient presents with  . Skin Problem    The patient is a 33 year old female here for evaluation of her abdomen.  She underwent a gastric bypass 2 years ago.  Her start weight was 328 pounds.  She is now 202 pounds and 5 feet 7 inches tall.  She complains of low back pain due to the excess weight.  She is exercising regularly but finds it difficult because of the pannus.  It is also difficult for her to fit into proper close because of the excess tissue.  She feels she is at a good weight for her height.  She has been stable at this weight for the past 6 months.  She has polycystic ovary disease, no diabetes and never smoked.  She has a female partner with a stepchild and a foster child.  She has no plans for pregnancy.  She states she is 100% sure she has no plans for pregnancy.  She has been doing.  Nothing makes it better and it seems to get worse with increased weight loss.  I do not see any rashes today but she reports that they come and go especially in the warm weather.  She has several rolls with a pannus that hangs be on her pubis.   Review of Systems  Constitutional: Negative.   HENT: Negative.   Eyes: Negative.   Respiratory: Negative.   Cardiovascular: Negative.   Gastrointestinal: Negative.   Endocrine: Negative.   Genitourinary: Negative.   Musculoskeletal: Positive for back pain and neck pain.  Hematological: Negative.   Psychiatric/Behavioral: Negative.     Past Medical History:  Diagnosis Date  . Anemia    Never had a blood transfusion  . Iron deficiency anemia 03/11/2017  . PCOS (polycystic ovarian syndrome) 03/29/2016  . 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 GASTRIC ROUX-EN-Y N/A 03/29/2016   Procedure: LAPAROSCOPIC ROUX-EN-Y GASTRIC BYPASS WITH UPPER ENDOSCOPY;  Surgeon: Gaynelle Adu, MD;  Location: [facility];   Service: General;  Laterality: N/A;  . UPPER GI ENDOSCOPY  03/29/2016   Procedure: UPPER GI ENDOSCOPY;  Surgeon: Gaynelle Adu, MD;  Location: [facility];  Service: General;;      Current Outpatient Medications:  .  Calcium 500 MG tablet, Take 500 mg by mouth 3 (three) times daily., Disp: , Rfl:  .  Multiple Vitamins-Iron (MULTIVITAMIN/IRON PO), Take 1 tablet by mouth 2 (two) times daily., Disp: , Rfl:  .  triamcinolone cream (KENALOG) 0.1 %, Apply 1 application topically 2 (two) times daily. For 2 weeks, then as needed, Disp: 45 g, Rfl: 0   Objective:   There were no vitals filed for this visit.  Physical Exam Vitals signs and nursing note reviewed.  Constitutional:      Appearance: Normal appearance.  HENT:     Head: Normocephalic and atraumatic.     Right Ear: External ear normal.     Left Ear: External ear normal.     Nose: Nose normal.     Mouth/Throat:     Mouth: Mucous membranes are moist.  Cardiovascular:     Rate and Rhythm: Normal rate and regular rhythm.  Pulmonary:     Effort: Pulmonary effort is normal.  Abdominal:     General: There is no distension.     Tenderness: There is no abdominal tenderness.  There is no guarding.  Musculoskeletal:        General: No deformity.  Skin:    Capillary Refill: Capillary refill takes less than 2 seconds.  Neurological:     General: No focal deficit present.     Mental Status: She is alert.  Psychiatric:        Mood and Affect: Mood normal.        Thought Content: Thought content normal.        Judgment: Judgment normal.     Assessment & Plan:  Panniculitis  The patient is a good candidate for a panniculectomy.  Alena Bills , DO

## 2018-09-17 ENCOUNTER — Encounter: Payer: Self-pay | Admitting: Family Medicine

## 2018-09-18 ENCOUNTER — Other Ambulatory Visit: Payer: Self-pay | Admitting: *Deleted

## 2018-09-18 DIAGNOSIS — Z20822 Contact with and (suspected) exposure to covid-19: Secondary | ICD-10-CM

## 2018-09-18 DIAGNOSIS — Z20828 Contact with and (suspected) exposure to other viral communicable diseases: Secondary | ICD-10-CM

## 2018-09-18 NOTE — Telephone Encounter (Signed)
Please wait for her reply and then order accordingly.  She can get done here or at her lab: I think she works at [employer]  Thanks!

## 2018-09-21 ENCOUNTER — Other Ambulatory Visit: Payer: Self-pay | Admitting: Internal Medicine

## 2018-09-21 DIAGNOSIS — Z20822 Contact with and (suspected) exposure to covid-19: Secondary | ICD-10-CM

## 2018-09-26 LAB — NOVEL CORONAVIRUS, NAA: SARS-CoV-2, NAA: NOT DETECTED

## 2018-10-16 ENCOUNTER — Other Ambulatory Visit: Payer: Self-pay

## 2018-10-16 DIAGNOSIS — Z20822 Contact with and (suspected) exposure to covid-19: Secondary | ICD-10-CM

## 2018-10-17 LAB — NOVEL CORONAVIRUS, NAA: SARS-CoV-2, NAA: NOT DETECTED

## 2018-12-08 ENCOUNTER — Encounter (HOSPITAL_COMMUNITY): Payer: Self-pay

## 2018-12-09 ENCOUNTER — Encounter: Payer: Self-pay | Admitting: Family Medicine

## 2018-12-09 ENCOUNTER — Encounter: Payer: Self-pay | Admitting: Plastic Surgery

## 2018-12-11 NOTE — Telephone Encounter (Signed)
Patient is coming in tomorrow. She and I have devised a plan of action and will work on the appeal submitted by the patient, since they denied our original appeal.

## 2018-12-12 ENCOUNTER — Other Ambulatory Visit: Payer: Self-pay

## 2018-12-12 ENCOUNTER — Encounter: Payer: Self-pay | Admitting: Surgical

## 2018-12-12 ENCOUNTER — Ambulatory Visit (INDEPENDENT_AMBULATORY_CARE_PROVIDER_SITE_OTHER): Payer: No Typology Code available for payment source | Admitting: Surgical

## 2018-12-12 VITALS — BP 118/78 | HR 70 | Temp 97.7°F | Ht 67.0 in | Wt 205.0 lb

## 2018-12-12 DIAGNOSIS — M545 Low back pain: Secondary | ICD-10-CM | POA: Diagnosis not present

## 2018-12-12 DIAGNOSIS — G8929 Other chronic pain: Secondary | ICD-10-CM | POA: Diagnosis not present

## 2018-12-12 DIAGNOSIS — M793 Panniculitis, unspecified: Secondary | ICD-10-CM

## 2018-12-12 NOTE — Progress Notes (Signed)
   Subjective:     Patient ID: Leah Gilmore, female    DOB: 03-17-1985, 33 y.o.   MRN: [id number]  Chief Complaint  Patient presents with  . Follow-up    reeval for panniculectomy    HPI: The patient is a 33 y.o. female here for follow-up/evaluation of her abdomen. She underwent gastric bypass 03/29/2016 and had a starting weight of 328 pounds.  She is now 205 pounds and 5 feet 7 inches tall.  Patient last seen by Dr. Marla Roe on 04/21/2018 for evaluation of her abdomen, pannus.  At that time it was recommended she undergo panniculectomy.  She is having low back pain due to excess weight in her abdominal area, difficulty exercising because of her pannus, difficulty finding properly fitting close and having to wear multiple layers of clothing.  She has to wear spanks for compression with exercise, despite this she still has difficulty with exercise and is unable to run/jog comfortably.  She also notes the redundant tissue prevents her to do many exercises as it slaps her in her chest and legs. Her weight has been stable for over 12 months now.  She has a history of PCOS.  She is a non-smoker and does not have diabetes.  She has no plans for pregnancy and she is 100% sure that she has no plans for pregnancy.  She has had multiple episodes of panniculitis and rashes requiring multiple topical creams.  Today on exam she has a hyperpigmented area at the medial aspect of her pannus. No sign of current irritation or rashes.  Review of Systems  Constitutional: Positive for weight loss. Negative for chills, diaphoresis, fever and malaise/fatigue.  Respiratory: Negative.   Cardiovascular: Negative.   Gastrointestinal: Negative for nausea and vomiting.  Genitourinary: Negative.   Musculoskeletal: Positive for back pain.  Neurological: Negative.     Objective:   Vital Signs BP 118/78 (BP Location: Left Arm, Patient Position: Sitting, Cuff Size: Large)   Pulse 70   Temp 97.7 F (36.5  C) (Temporal)   Ht 5\' 7"  (1.702 m)   Wt 205 lb (93 kg)   LMP 11/23/2018 (Exact Date)   SpO2 98%   BMI 32.11 kg/m  Vital Signs and Nursing Note Reviewed Chaperone present Physical Exam  Constitutional: She is oriented to person, place, and time and well-developed, well-nourished, and in no distress.  HENT:  Head: Normocephalic and atraumatic.  Cardiovascular: Normal rate.  Pulmonary/Chest: Effort normal. No respiratory distress.  Abdominal: Soft. Bowel sounds are normal. She exhibits no distension.  Significant pannus, hyperpigmentation midline within pannus fold, no erythema.  Pannus hangs over mons pubis and overlies anterior thighs  Musculoskeletal: Normal range of motion.        General: No edema.  Neurological: She is alert and oriented to person, place, and time. Gait normal.  Skin: Skin is warm and dry. No rash noted. She is not diaphoretic. No erythema. No pallor.  Hyperpigmentation midline abdominal pannus fold  Psychiatric: Mood and affect normal.    Assessment/Plan:     ICD-10-CM   1. Panniculitis  M79.3   2. Chronic bilateral low back pain without sciatica  M54.5    G89.29    We recommend abdominal panniculectomy.  Pictures were obtained of the patient and placed in the chart with the patient's or guardian's permission.  Carola Rhine Mariamawit Depaoli, PA-C 12/12/2018, 4:20 PM

## 2019-03-16 ENCOUNTER — Ambulatory Visit: Payer: No Typology Code available for payment source | Admitting: Dietician

## 2019-03-23 ENCOUNTER — Encounter: Payer: No Typology Code available for payment source | Admitting: Family Medicine

## 2019-03-28 ENCOUNTER — Ambulatory Visit: Payer: Self-pay | Admitting: Dietician

## 2019-04-17 ENCOUNTER — Ambulatory Visit (INDEPENDENT_AMBULATORY_CARE_PROVIDER_SITE_OTHER): Payer: Self-pay | Admitting: Family Medicine

## 2019-04-17 ENCOUNTER — Encounter: Payer: Self-pay | Admitting: Family Medicine

## 2019-04-17 ENCOUNTER — Other Ambulatory Visit: Payer: Self-pay

## 2019-04-17 VITALS — BP 136/68 | HR 71 | Temp 98.0°F | Ht 67.0 in | Wt 210.4 lb

## 2019-04-17 DIAGNOSIS — K219 Gastro-esophageal reflux disease without esophagitis: Secondary | ICD-10-CM

## 2019-04-17 DIAGNOSIS — Z0000 Encounter for general adult medical examination without abnormal findings: Secondary | ICD-10-CM | POA: Diagnosis not present

## 2019-04-17 DIAGNOSIS — Z9884 Bariatric surgery status: Secondary | ICD-10-CM

## 2019-04-17 DIAGNOSIS — D500 Iron deficiency anemia secondary to blood loss (chronic): Secondary | ICD-10-CM

## 2019-04-17 DIAGNOSIS — E282 Polycystic ovarian syndrome: Secondary | ICD-10-CM

## 2019-04-17 MED ORDER — FAMOTIDINE 40 MG PO TABS: 40.0000 mg | ORAL_TABLET | Freq: Every day | ORAL | 1 refills | Status: DC

## 2019-04-17 NOTE — Progress Notes (Signed)
Subjective  Chief Complaint  Patient presents with  . Annual Exam    not fasting. No concerns today    HPI: Leah Gilmore is a 34 y.o. female who presents to [facility] [place] today for a Female Wellness Visit.  She also has the concerns and/or needs as listed above in the chief complaint. These will be addressed in addition to the Health Maintenance Visit.   Wellness Visit: annual visit with health maintenance review and exam without Pap   HM: doing well. Weight is stable. Has another foster baby: 5 kids in the home right now. She is tired of the pandemic restrictions but coping well. Has a flexible job. Exercising at least once a week. Chronic disease management visit and/or acute problem visit:  Heavy periods due to PCOS w/ h/o iron deficiency anemia, mulifactorial; feels good. Menses are stable;manages ok. Irregular iron supplementation. Denies cp or sob or fatigue.   Admits to intermittent and more frequent indigestion. No melena. No abd pain with mealse.  Assessment  1. Annual physical exam   2. S/P gastric bypass   3. PCOS (polycystic ovarian syndrome)   4. Iron deficiency anemia due to chronic blood loss   5. Gastroesophageal reflux disease, unspecified whether esophagitis present      Plan  Female Wellness Visit:  Age appropriate Health Maintenance and Prevention measures were discussed with patient. Included topics are cancer screening recommendations, ways to keep healthy (see AVS) including dietary and exercise recommendations, regular eye and dental care, use of seat belts, and avoidance of moderate alcohol use and tobacco use.   BMI: discussed patient's BMI and encouraged positive lifestyle modifications to help get to or maintain a target BMI.  HM needs and immunizations were addressed and ordered. See below for orders. See HM and immunization section for updates.  Routine labs and screening tests ordered including cmp, cbc and lipids  where appropriate.  Discussed recommendations regarding Vit D and calcium supplementation (see AVS)  Chronic disease f/u and/or acute problem visit: (deemed necessary to be done in addition to the wellness visit):  S/p gastric bypass:  bmi 32; stable. Check for iron and vit deficiencies  PCOS and menorrhagia: declines hormonal manipulation  H/o iron deficiency anemia: recheck today  GERD: active. Trial of pepcid and education given.   Follow up: Return in about 1 year (around 04/16/2020) for complete physical.   Orders Placed This Encounter  Procedures  . CBC with Differential/Platelet  . Comprehensive metabolic panel  . Lipid panel  . Iron, TIBC and Ferritin Panel  . TSH  . VITAMIN D 25 Hydroxy (Vit-D Deficiency, Fractures)  . Vitamin B12   Meds ordered this encounter  Medications  . famotidine (PEPCID) 40 MG tablet    Sig: Take 1 tablet (40 mg total) by mouth daily.    Dispense:  30 tablet    Refill:  1      Lifestyle: Body mass index is 32.95 kg/m. Wt Readings from Last 3 Encounters:  04/17/19 210 lb 6.4 oz (95.4 kg)  12/12/18 205 lb (93 kg)  04/21/18 202 lb (91.6 kg)     Patient Active Problem List   Diagnosis Date Noted  . Iron deficiency anemia 03/11/2017  . PCOS (polycystic ovarian syndrome) 03/29/2016  . 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 INFLUENZA VACCINE  06/06/2019 (Originally 10/07/2018)  . PAP SMEAR-Modifier  03/11/2020  . TETANUS/TDAP  10/11/2023  . HIV Screening  Completed  Immunization History  Administered Date(s) Administered  . Influenza, Seasonal, Injecte, Preservative Fre 11/20/2014  . Influenza,inj,Quad PF,6+ Mos 10/31/2016, 12/16/2017  . Influenza-Unspecified 11/29/2016  . Tdap 10/10/2013   We updated and reviewed the patient's past history in detail and it is documented below. Allergies: Patient  reports no history of alcohol use. Past Medical History Patient  has a past medical history of  Anemia, Iron deficiency anemia (03/11/2017), PCOS (polycystic ovarian syndrome) (03/29/2016), and S/P gastric bypass (03/29/2016). 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 Social History   Socioeconomic History  . Marital status: Married    Spouse name: Glenford Peers  . Number of children: 2  . Years of education: Not on file  . Highest education level: Not on file  Occupational History  . Occupation: Counselling psychologist, housing loans    Employer: [employer]  Tobacco Use  . Smoking status: Never Smoker  . Smokeless tobacco: Never Used  Substance and Sexual Activity  . Alcohol use: No    Alcohol/week: 0.0 standard drinks    Comment: rarely  . Drug use: No  . Sexual activity: Yes    Birth control/protection: None    Comment: same sex couple  Other Topics Concern  . Not on file  Social History Narrative  . Not on file   Social Determinants of Health   Financial Resource Strain:   . Difficulty of Paying Living Expenses: Not on file  Food Insecurity:   . Worried About Charity fundraiser in the Last Year: Not on file  . Ran Out of Food in the Last Year: Not on file  Transportation Needs:   . Lack of Transportation (Medical): Not on file  . Lack of Transportation (Non-Medical): Not on file  Physical Activity:   . Days of Exercise per Week: Not on file  . Minutes of Exercise per Session: Not on file  Stress:   . Feeling of Stress : Not on file  Social Connections:   . Frequency of Communication with Friends and Family: Not on file  . Frequency of Social Gatherings with Friends and Family: Not on file  . Attends Religious Services: Not on file  . Active Member of Clubs or Organizations: Not on file  . Attends Archivist Meetings: Not on file  . Marital Status: Not on file   Family History  Problem Relation Age of Onset  . Cancer Father 58       lung  cancer, smoker  . Alcohol abuse Father   . Drug abuse Father   . Diabetes Maternal Grandmother   . Cancer Maternal Grandfather        prostate  . Diabetes Paternal Grandmother   . Hypertension Paternal Grandmother     Review of Systems: Constitutional: negative for fever or malaise Ophthalmic: negative for photophobia, double vision or loss of vision Cardiovascular: negative for chest pain, dyspnea on exertion, or new LE swelling Respiratory: negative for SOB or persistent cough Gastrointestinal: negative for abdominal pain, change in bowel habits or melena Genitourinary: negative for dysuria or gross hematuria, no abnormal uterine bleeding or disharge Musculoskeletal: negative for new gait disturbance or muscular weakness Integumentary: negative for new or persistent rashes, no breast lumps Neurological: negative for TIA or stroke symptoms Psychiatric: negative for SI or delusions Allergic/Immunologic: negative for hives  Patient Care Team    Relationship Specialty Notifications Start End  Kershaw, Baron L,  MD PCP - General Family Medicine  03/23/16   Serena Croissant, MD Consulting Physician Hematology and Oncology  03/20/18   Peggye Form, DO Attending Physician Plastic Surgery  04/17/19     Objective  Vitals: BP 136/68 (BP Location: Left Arm, Patient Position: Sitting, Cuff Size: Normal)   Pulse 71   Temp 98 F (36.7 C) (Temporal)   Ht 5\' 7"  (1.702 m)   Wt 210 lb 6.4 oz (95.4 kg)   LMP 03/24/2019   SpO2 99%   BMI 32.95 kg/m  General:  Well developed, well nourished, no acute distress  Psych:  Alert and orientedx3,normal mood and affect HEENT:  Normocephalic, atraumatic, non-icteric sclera, PERRL, oropharynx is clear without mass or exudate, supple neck without adenopathy, mass or thyromegaly Cardiovascular:  Normal S1, S2, RRR without gallop, rub or murmur, nondisplaced PMI Respiratory:  Good breath sounds bilaterally, CTAB with normal respiratory  effort Gastrointestinal: normal bowel sounds, soft, mild midepigastric ttp w/o rebound, guarding or mass, no noted masses. No HSM MSK: no deformities, contusions. Joints are without erythema or swelling. Spine and CVA region are nontender Skin:  Warm, no rashes or suspicious lesions noted Neurologic:    Mental status is normal. CN 2-11 are normal. Gross motor and sensory exams are normal. Normal gait. No tremor Breast Exam: No mass, skin retraction or nipple discharge is appreciated in either breast. No axillary adenopathy. Fibrocystic changes are not noted     Commons side effects, risks, benefits, and alternatives for medications and treatment plan prescribed today were discussed, and the patient expressed understanding of the given instructions. Patient is instructed to call or message via MyChart if he/she has any questions or concerns regarding our treatment plan. No barriers to understanding were identified. We discussed Red Flag symptoms and signs in detail. Patient expressed understanding regarding what to do in case of urgent or emergency type symptoms.   Medication list was reconciled, printed and provided to the patient in AVS. Patient instructions and summary information was reviewed with the patient as documented in the AVS. This note was prepared with assistance of Dragon voice recognition software. Occasional wrong-word or sound-a-like substitutions may have occurred due to the inherent limitations of voice recognition software  This visit occurred during the SARS-CoV-2 public health emergency.  Safety protocols were in place, including screening questions prior to the visit, additional usage of staff PPE, and extensive cleaning of exam room while observing appropriate contact time as indicated for disinfecting solutions.

## 2019-04-17 NOTE — Patient Instructions (Addendum)
Please return in 12 months for your annual complete physical; please come fasting.  I will release your lab results to you on your MyChart account with further instructions. Please reply with any questions.   Take pepcid daily for 4-6 weeks, then as needed for indigestion .  If you have any questions or concerns, please don't hesitate to send me a message via MyChart or call the office at [phone]. Thank you for visiting with [name] today! It's our pleasure caring for you.  Please do these things to maintain good health!   Exercise at least 30-45 minutes a day,  4-5 days a week.   Eat a low-fat diet with lots of fruits and vegetables, up to 7-9 servings per day.  Drink plenty of water daily. Try to drink 8 8oz glasses per day.  Seatbelts can save your life. Always wear your seatbelt.  Place Smoke Detectors on every level of your home and check batteries every year.  Schedule an appointment with an eye doctor for an eye exam every 1-2 years  Safe sex - use condoms to protect yourself from STDs if you could be exposed to these types of infections. Use birth control if you do not want to become pregnant and are sexually active.  Avoid heavy alcohol use. If you drink, keep it to less than 2 drinks/day and not every day.  Health Care Power of Attorney.  Choose someone you trust that could speak for you if you became unable to speak for yourself.  Depression is common in our stressful world.If you're feeling down or losing interest in things you normally enjoy, please come in for a visit.  If anyone is threatening or hurting you, please get help. Physical or Emotional Violence is never OK.

## 2019-04-18 ENCOUNTER — Encounter: Payer: Self-pay | Admitting: Family Medicine

## 2019-04-18 LAB — COMPREHENSIVE METABOLIC PANEL
AG Ratio: 1.3 (calc) (ref 1.0–2.5)
ALT: 38 U/L — ABNORMAL HIGH (ref 6–29)
AST: 26 U/L (ref 10–30)
Albumin: 4.4 g/dL (ref 3.6–5.1)
Alkaline phosphatase (APISO): 82 U/L (ref 31–125)
BUN: 17 mg/dL (ref 7–25)
CO2: 24 mmol/L (ref 20–32)
Calcium: 9.5 mg/dL (ref 8.6–10.2)
Chloride: 105 mmol/L (ref 98–110)
Creat: 0.71 mg/dL (ref 0.50–1.10)
Globulin: 3.5 g/dL (calc) (ref 1.9–3.7)
Glucose, Bld: 84 mg/dL (ref 65–99)
Potassium: 4.3 mmol/L (ref 3.5–5.3)
Sodium: 140 mmol/L (ref 135–146)
Total Bilirubin: 1.1 mg/dL (ref 0.2–1.2)
Total Protein: 7.9 g/dL (ref 6.1–8.1)

## 2019-04-18 LAB — CBC WITH DIFFERENTIAL/PLATELET
Absolute Monocytes: 484 cells/uL (ref 200–950)
Basophils Absolute: 20 cells/uL (ref 0–200)
Basophils Relative: 0.5 %
Eosinophils Absolute: 40 cells/uL (ref 15–500)
Eosinophils Relative: 1 %
HCT: 36.9 % (ref 35.0–45.0)
Hemoglobin: 11.7 g/dL (ref 11.7–15.5)
Lymphs Abs: 1732 cells/uL (ref 850–3900)
MCH: 28.7 pg (ref 27.0–33.0)
MCHC: 31.7 g/dL — ABNORMAL LOW (ref 32.0–36.0)
MCV: 90.4 fL (ref 80.0–100.0)
MPV: 11.8 fL (ref 7.5–12.5)
Monocytes Relative: 12.1 %
Neutro Abs: 1724 cells/uL (ref 1500–7800)
Neutrophils Relative %: 43.1 %
Platelets: 324 10*3/uL (ref 140–400)
RBC: 4.08 10*6/uL (ref 3.80–5.10)
RDW: 11.7 % (ref 11.0–15.0)
Total Lymphocyte: 43.3 %
WBC: 4 10*3/uL (ref 3.8–10.8)

## 2019-04-18 LAB — LIPID PANEL
Cholesterol: 168 mg/dL (ref <200)
HDL: 71 mg/dL (ref >=50)
LDL Cholesterol (Calc): 82 mg/dL (calc)
Non-HDL Cholesterol (Calc): 97 mg/dL (calc) (ref <130)
Total CHOL/HDL Ratio: 2.4 (calc) (ref <5.0)
Triglycerides: 74 mg/dL (ref <150)

## 2019-04-18 LAB — VITAMIN B12: Vitamin B-12: 1050 pg/mL (ref 200–1100)

## 2019-04-18 LAB — IRON,TIBC AND FERRITIN PANEL
%SAT: 27 % (calc) (ref 16–45)
Ferritin: 13 ng/mL — ABNORMAL LOW (ref 16–154)
Iron: 113 ug/dL (ref 40–190)
TIBC: 417 mcg/dL (calc) (ref 250–450)

## 2019-04-18 LAB — VITAMIN D 25 HYDROXY (VIT D DEFICIENCY, FRACTURES): Vit D, 25-Hydroxy: 18 ng/mL — ABNORMAL LOW (ref 30–100)

## 2019-04-18 LAB — TSH: TSH: 2.14 mIU/L

## 2019-04-18 MED ORDER — VITAMIN D (ERGOCALCIFEROL) 1.25 MG (50000 UNIT) PO CAPS: 50000.0000 [IU] | ORAL_CAPSULE | ORAL | 0 refills | Status: DC

## 2019-04-18 NOTE — Telephone Encounter (Signed)
Please Advise

## 2019-04-18 NOTE — Addendum Note (Signed)
Addended by: Asencion Partridge on: 04/18/2019 07:59 AM   Modules accepted: Orders

## 2019-04-30 ENCOUNTER — Ambulatory Visit (INDEPENDENT_AMBULATORY_CARE_PROVIDER_SITE_OTHER): Payer: No Typology Code available for payment source | Admitting: Family Medicine

## 2019-04-30 ENCOUNTER — Encounter (INDEPENDENT_AMBULATORY_CARE_PROVIDER_SITE_OTHER): Payer: Self-pay | Admitting: Family Medicine

## 2019-04-30 ENCOUNTER — Other Ambulatory Visit (INDEPENDENT_AMBULATORY_CARE_PROVIDER_SITE_OTHER): Payer: Self-pay | Admitting: Family Medicine

## 2019-04-30 ENCOUNTER — Other Ambulatory Visit: Payer: Self-pay

## 2019-04-30 VITALS — BP 104/53 | HR 62 | Temp 98.6°F | Ht 67.0 in | Wt 209.0 lb

## 2019-04-30 DIAGNOSIS — Z9189 Other specified personal risk factors, not elsewhere classified: Secondary | ICD-10-CM | POA: Diagnosis not present

## 2019-04-30 DIAGNOSIS — Z6832 Body mass index (BMI) 32.0-32.9, adult: Secondary | ICD-10-CM

## 2019-04-30 DIAGNOSIS — R5383 Other fatigue: Secondary | ICD-10-CM

## 2019-04-30 DIAGNOSIS — E559 Vitamin D deficiency, unspecified: Secondary | ICD-10-CM

## 2019-04-30 DIAGNOSIS — R0602 Shortness of breath: Secondary | ICD-10-CM

## 2019-04-30 DIAGNOSIS — E282 Polycystic ovarian syndrome: Secondary | ICD-10-CM

## 2019-04-30 DIAGNOSIS — Z0289 Encounter for other administrative examinations: Secondary | ICD-10-CM

## 2019-04-30 DIAGNOSIS — Z1331 Encounter for screening for depression: Secondary | ICD-10-CM

## 2019-04-30 DIAGNOSIS — E669 Obesity, unspecified: Secondary | ICD-10-CM

## 2019-04-30 NOTE — Progress Notes (Signed)
Chief Complaint:   OBESITY Leah Gilmore (MR# [id number]) is a 34 y.o. female who presents for evaluation Leah treatment of obesity Leah related comorbidities. Leah Gilmore was told about back on track by [facility] Leah a friend who comes here. 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 Current BMI is Body mass index is 32.73 kg/m.Marland Kitchen Leah Gilmore has been struggling with her weight for many years Leah has been unsuccessful in either losing weight, maintaining weight loss, or reaching her healthy weight goal.  Leah Gilmore is currently in the action stage of change Leah ready to dedicate time achieving Leah maintaining a healthier weight. Leah Gilmore is interested in becoming our patient Leah working on intensive lifestyle modifications including (but not limited to) diet Leah exercise for weight loss.  Leah Gilmore's habits were reviewed today Leah are as follows: Her family eats meals together, her desired weight loss is 29 pounds, she has been heavy most of her life, she started gaining weight at 89+ years of age, her heaviest weight ever was 328 pounds, she has significant Gilmore cravings issues, she snacks frequently in the evenings, she skips meals frequently, she is frequently drinking liquids with calories, she frequently makes poor Gilmore choices Leah she struggles with emotional eating.  Breakfast consists of decaf coffee with silk caramel creamer (2 tsp), Kodiak waffle Leah 2 eggs or Premier Protein with 2 shot espresso (feels full). Lunch consists of PB&J (feels full), more peanut butter than jelly. 3:00 PM Leah Gilmore has a pack of Goldfish. Dinner consists of salad or pasta Leah red sauce, salmon, red potatoes (1/3 cup), no green beans, 3 gummy fruit snacks. Leah Gilmore does not drink water, mostly through the day.  Depression Screen Leah Gilmore Leah Mood (modified PHQ-9) score was mildly positive. PHQ 9 Score is 8  Depression screen PHQ 2/9 04/30/2019  Decreased Interest 1  Down, Depressed,  Hopeless 1  PHQ - 2 Score 2  Altered sleeping 0  Tired, decreased energy 2  Change in appetite 1  Feeling bad or failure about yourself  1  Trouble concentrating 2  Moving slowly or fidgety/restless 0  Suicidal thoughts 0  PHQ-9 Score 8  Difficult doing work/chores Not difficult at all   Subjective:   Other fatigue  Leah Gilmore admits to daytime somnolence Leah admits to waking up still tired. Patent has a history of symptoms of daytime fatigue, morning fatigue Leah morning headache. Leah Gilmore generally gets 4 or 5 hours of sleep per night, Leah states that she has generally restful sleep. Snoring is present. Apneic episodes are not present. Epworth Sleepiness Score is 9. EKG ordered today shows sinus bradycardia at 58 BPM.  Vitamin D deficiency Leah Gilmore is on 50,000 IU of vitamin D weekly Leah 2,000 IU daily. She admits to fatigue. Her recent vitamin D lab was 18 (04/17/19).   PCOS (polycystic ovarian syndrome) Leah Gilmore has no Hgb A1c or insulin level on recent labs. Her menstrual cycles are more regular since bariatric surgery.  Shortness of breath on exertion Leah Gilmore notes increasing shortness of breath with exercising Leah seems to be worsening over time with weight gain. She notes getting out of breath sooner with activity than she used to. This has not gotten worse recently. Leah Gilmore denies shortness of breath at rest or orthopnea.  At risk for dehydration Leah Gilmore is at risk for dehydration due to weight loss Leah recall of water intake.  Assessment/Plan:   Other fatigue  Leah Gilmore does feel that her weight is causing her  energy to be lower than it should be. Fatigue may be related to obesity, depression or many other causes. Labs Leah EKG will be ordered, Leah in the meanwhile, Leah Gilmore will focus on self care including making healthy Gilmore choices, increasing physical activity Leah focusing on stress reduction.  Vitamin D deficiency Low Vitamin D level contributes to fatigue Leah are associated with obesity, breast, Leah colon cancer.  She will continue to take vitamin D supplementation Leah we will retest labs in 3 months. Leah Gilmore will follow-up for routine testing of Vitamin D, at least 2-3 times per year to avoid over-replacement.  PCOS (polycystic ovarian syndrome) Intensive lifestyle modifications are first line treatment for this issue. We discussed several lifestyle modifications today Leah she will continue to work on diet, exercise Leah weight loss efforts. We will check Hgb A1c Leah insulin level today. Orders Leah follow up as documented in patient record.  Counseling . PCOS is a leading cause of menstrual irregularities Leah infertility. It is also associated with obesity, hirsutism (excessive hair growth on the face, chest, or back), Leah cardiovascular risk factors such as high cholesterol Leah insulin resistance. . Insulin resistance appears to play a central role.  . Women with PCOS have been shown to have impaired appetite-regulating hormones. . Metformin is one medication that can improve metabolic parameters.  . Women with polycystic ovary syndrome (PCOS) have an increased risk for cardiovascular disease (CVD) - European Journal of Preventive Cardiology.  Shortness of breath on exertion Leah Gilmore does feel that she gets out of breath more easily that she used to when she exercises. Leah Gilmore's shortness of breath appears to be obesity related Leah exercise induced. She has agreed to work on weight loss Leah gradually increase exercise to treat her exercise induced shortness of breath. Labs Leah indirect calorimetry will be ordered today. We will continue to monitor closely.  Depression screening Leah Gilmore had a positive depression screening. Depression is commonly associated with obesity Leah often results in emotional eating behaviors. We will monitor this closely Leah work on CBT to help improve the non-hunger eating patterns. Referral to Psychology may be required if no improvement is seen as she continues in our clinic.  At risk for  dehydration Leah Gilmore was given approximately 15 minutes dehydration prevention counseling today. Leah Gilmore is at risk for dehydration due to weight loss Leah recall of water intake. She was encouraged to hydrate Leah monitor fluid status to avoid dehydration as well as weight loss plateaus.   Class 1 obesity with serious comorbidity Leah body mass index (BMI) of 32.0 to 32.9 in adult, unspecified obesity type Leah Gilmore is currently in the action stage of change Leah her goal is to continue with weight loss efforts. I recommend Leah Gilmore begin the structured treatment plan as follows:  She has agreed to the [employer] +200 calories.  Behavioral modification strategies: increasing lean protein intake, increasing vegetables, meal planning Leah cooking strategies, keeping healthy foods in the home Leah planning for success.  She was informed of the importance of frequent follow-up visits to maximize her success with intensive lifestyle modifications for her multiple health conditions. She was informed we would discuss her lab results at her next visit unless there is a critical issue that needs to be addressed sooner. Leah Gilmore agreed to keep her next visit at the agreed upon time to discuss these results.  Objective:   Blood pressure (!) 104/53, pulse 62, temperature 98.6 F (37 C), temperature source Oral, height 5\' 7"  (1.702 m), weight 209 lb (  94.8 kg), last menstrual period 04/25/2019, SpO2 99 %. Body mass index is 32.73 kg/m.  EKG: Normal sinus rhythm, rate 58 BPM.  Indirect Calorimeter completed today shows a VO2 of 264 Leah a REE of 1839.  Her calculated basal metabolic rate is 0092 thus her basal metabolic rate is better than expected.  General: Cooperative, alert, well developed, in no acute distress. HEENT: Conjunctivae Leah lids unremarkable. Cardiovascular: Regular rhythm.  Lungs: Normal work of breathing. Musculoskeletal: 1+ pitting edema bilateral lower extremities Neurologic: No focal deficits.   Lab  Results  Component Value Date   CREATININE 0.71 04/17/2019   BUN 17 04/17/2019   NA 140 04/17/2019   K 4.3 04/17/2019   CL 105 04/17/2019   CO2 24 04/17/2019   Lab Results  Component Value Date   ALT 38 (H) 04/17/2019   AST 26 04/17/2019   ALKPHOS 67 03/16/2018   BILITOT 1.1 04/17/2019   No results found for: HGBA1C No results found for: INSULIN Lab Results  Component Value Date   TSH 2.14 04/17/2019   Lab Results  Component Value Date   CHOL 168 04/17/2019   HDL 71 04/17/2019   LDLCALC 82 04/17/2019   TRIG 74 04/17/2019   CHOLHDL 2.4 04/17/2019   Lab Results  Component Value Date   WBC 4.0 04/17/2019   HGB 11.7 04/17/2019   HCT 36.9 04/17/2019   MCV 90.4 04/17/2019   PLT 324 04/17/2019   Lab Results  Component Value Date   IRON 113 04/17/2019   TIBC 417 04/17/2019   FERRITIN 13 (L) 04/17/2019    Ref. Range 04/17/2019 11:27  Vitamin D, 25-Hydroxy Latest Ref Range: 30 - 100 ng/mL 18 (L)    Attestation Statements:   Reviewed by clinician on day of visit: allergies, medications, problem list, medical history, surgical history, family history, social history, Leah previous encounter notes.  I, Doreene Nest, am acting as transcriptionist for Eber Jones, MD.  I have reviewed the above documentation for accuracy Leah completeness, Leah I agree with the above. - Ilene Qua, MD

## 2019-05-01 ENCOUNTER — Encounter: Payer: No Typology Code available for payment source | Attending: General Surgery | Admitting: Dietician

## 2019-05-01 ENCOUNTER — Encounter: Payer: Self-pay | Admitting: Dietician

## 2019-05-01 ENCOUNTER — Other Ambulatory Visit: Payer: Self-pay

## 2019-05-01 DIAGNOSIS — Z9884 Bariatric surgery status: Secondary | ICD-10-CM

## 2019-05-01 LAB — T3: T3, Total: 118 ng/dL (ref 76–181)

## 2019-05-01 LAB — HEMOGLOBIN A1C: Hemoglobin A1C: 4.8

## 2019-05-01 LAB — SAR COV2 SEROLOGY (COVID19)AB(IGG),IA: SARS CoV2 AB IGG: NEGATIVE

## 2019-05-01 LAB — HEMOGLOBIN A1C W/OUT EAG: Hgb A1c MFr Bld: 4.8 % of total Hgb (ref <5.7)

## 2019-05-01 LAB — T4: T4, Total: 9.4 ug/dL (ref 5.1–11.9)

## 2019-05-01 LAB — INSULIN, RANDOM: Insulin: 5.6 u[IU]/mL

## 2019-05-01 LAB — T3, FREE: T3, Free: 3.2 pg/mL (ref 2.3–4.2)

## 2019-05-01 NOTE — Progress Notes (Signed)
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 To weigh less than 200 lbs. Pt states she would be satisfied with weighing 200 lbs, but weight has seemed to stick around 205 and she is now at 209 lbs.    TANITA  BODY COMP RESULTS  03/03/16 04/13/16 05/25/16 07/30/16 11/02/16 04/05/17 09/23/17 03/31/18   BMI (kg/m^2) 51.4 47.3 44.6 40.8 37.8 34.7 32.5 31.6   Fat Mass (lbs) 190.8 170.4 154.6 140.0 119.6 103.2 92.4 87.4   Fat Free Mass (lbs) 137.2 131.8 138.6 128.2 127.4 118.6 114.8 114.2   Total Body Water (lbs) 104.2 99.4  95.4 94.0 86.8 83.4 82.6   Psychosocial / Lifestyle  Patient states she is frustrated because of gaining ~7 lbs over the past year. States she is still working from home, which provides access to all the food in the house 24/7. States she knows what she needs to do but has a hard time with consistency. States she does not drink enough fluid (will have coffee and 1 bottled water per day) but would like to work on increasing fluid intake. States she is not meeting her protein goal and would like to focus on this again, such as incorporating more high-protein snacks throughout the day and having Premier Protein shakes on hand as needed. States she recently started with the [facility]. Patients knows she is "numbers focused" and is actively working on breaking her focus on this.   Will have [employer] or [employer] waffle for breakfast with egg whites, may also add chicken sausage. May snack on Cheez-Its or other snacks that are in the house for the kids. States she does well with meal planning for Monday - Wednesday but struggles in planning the rest of the week.    Non-scale victories: ability to cross legs with ease, increased energy, improved self-esteem, more self-confidence. Supplements: 3 calcium + bariatric 1/day with iron  Estimated Daily Fluid Intake: 32 oz Estimated Daily Protein Intake: ~50 g  Physical Activity  Current average weekly physical activity: works with a Administrator, [employer] Go[employer] Using straws: yes Drinking while eating: no Chewing/swallowing difficulties: no Changes in vision: no Changes to mood/headaches: no Hair loss/changes to skin/nails: no Difficulty focusing/concentrating: no Sweating: no Dizziness/lightheadedness: no Palpitations: no  Carbonated/caffeinated beverages: sometimes N/V/D/C/Gas: no Abdominal pain: no Dumping syndrome: yes (not in a while, would happen sporadically if ate the wrong thing) Last Lap-Band fill: N/A   NUTRITION DIAGNOSIS  Inadequate fluid intake (NI-3.1) related to hx of bariatric surgery as evidenced by patient reported drinking less than 64 ounces of fluid daily.    NUTRITION INTERVENTION Nutrition counseling (C-1) and education (E-2) to facilitate bariatric surgery goals, including: . Diet education for maintaining lifestyle habits, focusing on adequate protein and fluid intake  . The importance of consuming adequate calories as well as certain nutrients daily due to the body's need for essential vitamins, minerals, and fats . The importance of daily physical activity and to reach a goal of at least 150 minutes of moderate to vigorous physical activity weekly (or as directed by their physician) due to benefits such as increased musculature and improved lab values  Handouts Provided Include  Phase 7: Lifelong Maintenance   Bariatric MyPlate   Learning Style & Readiness for Change Teaching method utilized: Visual & Auditory  Demonstrated degree of understanding via: Teach Back  Barriers to learning/adherence to lifestyle change: None Identified   MONITORING &  EVALUATION Dietary intake, weekly physical activity, and body weight at next nutrition visit.  Next Steps Patient is to follow-up as needed/desired by patient. Patient is aware she may reach out via phone/email in the future as needed.

## 2019-05-01 NOTE — Patient Instructions (Signed)
Remember to focus on increasing protein intake- try to include a source of protein at all meals and snacks.   To increase fluid intake, try keeping a water bottle with you at all times and having water available/on hand all throughout the house, and don't leave home without taking something to drink with you. Reusable water bottles are great!   Keep up the great work with eating consistently throughout the day, taking your multivitamin/iron/calcium, and staying physically active. You've made such good progress and are doing great things for your body!

## 2019-05-02 ENCOUNTER — Encounter (INDEPENDENT_AMBULATORY_CARE_PROVIDER_SITE_OTHER): Payer: Self-pay | Admitting: Family Medicine

## 2019-05-02 NOTE — Telephone Encounter (Signed)
Please advise 

## 2019-05-03 ENCOUNTER — Encounter (INDEPENDENT_AMBULATORY_CARE_PROVIDER_SITE_OTHER): Payer: Self-pay

## 2019-05-09 ENCOUNTER — Other Ambulatory Visit: Payer: Self-pay | Admitting: Family Medicine

## 2019-05-14 ENCOUNTER — Ambulatory Visit (INDEPENDENT_AMBULATORY_CARE_PROVIDER_SITE_OTHER): Payer: No Typology Code available for payment source | Admitting: Family Medicine

## 2019-05-25 ENCOUNTER — Ambulatory Visit: Payer: No Typology Code available for payment source | Attending: Internal Medicine

## 2019-05-25 DIAGNOSIS — Z20822 Contact with and (suspected) exposure to covid-19: Secondary | ICD-10-CM

## 2019-05-26 LAB — NOVEL CORONAVIRUS, NAA: SARS-CoV-2, NAA: NOT DETECTED

## 2019-06-01 ENCOUNTER — Ambulatory Visit: Payer: No Typology Code available for payment source | Attending: Internal Medicine

## 2019-06-01 DIAGNOSIS — Z20822 Contact with and (suspected) exposure to covid-19: Secondary | ICD-10-CM

## 2019-06-02 LAB — SARS-COV-2, NAA 2 DAY TAT

## 2019-06-02 LAB — NOVEL CORONAVIRUS, NAA: SARS-CoV-2, NAA: NOT DETECTED

## 2019-06-05 ENCOUNTER — Encounter: Payer: Self-pay | Admitting: Family Medicine

## 2019-06-06 ENCOUNTER — Encounter (INDEPENDENT_AMBULATORY_CARE_PROVIDER_SITE_OTHER): Payer: Self-pay | Admitting: Family Medicine

## 2019-06-06 NOTE — Telephone Encounter (Signed)
Please advise 

## 2019-06-21 ENCOUNTER — Ambulatory Visit (INDEPENDENT_AMBULATORY_CARE_PROVIDER_SITE_OTHER): Payer: No Typology Code available for payment source | Admitting: Family Medicine

## 2019-06-21 ENCOUNTER — Other Ambulatory Visit: Payer: Self-pay

## 2019-06-21 ENCOUNTER — Encounter (INDEPENDENT_AMBULATORY_CARE_PROVIDER_SITE_OTHER): Payer: Self-pay | Admitting: Family Medicine

## 2019-06-21 VITALS — BP 106/56 | HR 77 | Temp 98.4°F | Ht 67.0 in | Wt 214.0 lb

## 2019-06-21 DIAGNOSIS — E669 Obesity, unspecified: Secondary | ICD-10-CM

## 2019-06-21 DIAGNOSIS — E559 Vitamin D deficiency, unspecified: Secondary | ICD-10-CM

## 2019-06-21 DIAGNOSIS — F419 Anxiety disorder, unspecified: Secondary | ICD-10-CM | POA: Diagnosis not present

## 2019-06-21 DIAGNOSIS — Z9189 Other specified personal risk factors, not elsewhere classified: Secondary | ICD-10-CM | POA: Diagnosis not present

## 2019-06-21 DIAGNOSIS — F32A Depression, unspecified: Secondary | ICD-10-CM

## 2019-06-21 DIAGNOSIS — R7989 Other specified abnormal findings of blood chemistry: Secondary | ICD-10-CM | POA: Diagnosis not present

## 2019-06-21 DIAGNOSIS — Z6833 Body mass index (BMI) 33.0-33.9, adult: Secondary | ICD-10-CM

## 2019-06-21 DIAGNOSIS — F329 Major depressive disorder, single episode, unspecified: Secondary | ICD-10-CM

## 2019-06-21 MED ORDER — SERTRALINE HCL 25 MG PO TABS: 25.0000 mg | ORAL_TABLET | Freq: Every day | ORAL | 0 refills | Status: DC

## 2019-06-21 MED ORDER — BUPROPION HCL ER (SR) 100 MG PO TB12: 100.0000 mg | ORAL_TABLET | Freq: Every day | ORAL | 0 refills | Status: DC

## 2019-06-25 NOTE — Progress Notes (Signed)
Chief Complaint:   OBESITY Leah Gilmore is here to discuss her progress with her obesity treatment plan along with follow-up of her obesity related diagnoses. Leah Gilmore is on the [employer] + 200 calories and states she is following her eating plan approximately 50% of the time. Leah Gilmore states she is bike riding for 30 minutes 3 times per week, and workout-cross training for 45 minutes 1 time per week.  Today's visit was #: 2 Starting weight: 209 lbs Starting date: 04/30/2019 Today's weight: 214 lbs Today's date: 06/25/2019 Total lbs lost to date: 0 Total lbs lost since last in-office visit: 0  Interim History: Leah Gilmore has had 3 cases of COVID in her house and life is still very busy. She is often bringing her kids to sports and she also has been getting her kitchen remodeled.   Subjective:   1. Vitamin D deficiency Leah Gilmore is on Vit D supplementation 50,000 IU weekly. She notes fatigue.  2. Elevated LFTs Leah Gilmore's ALT is 38, and AST 26. She has not had imaging done.   3. Anxiety and depression Leah Gilmore is really struggling with symptoms of decreased energy, increased fatigue, and increased comfort eating. She denies a history of seizure disorder. She denies suicidal ideas or homicidal ideas.  4. At risk for osteoporosis Leah Gilmore is at higher risk of osteopenia and osteoporosis due to Vitamin D deficiency.   Assessment/Plan:   1. Vitamin D deficiency Low Vitamin D level contributes to fatigue and are associated with obesity, breast, and colon cancer. Leah Gilmore agreed to continue taking prescription Vitamin D 50,000 IU every week and will follow-up for routine testing of Vitamin D, at least 2-3 times per year to avoid over-replacement.  2. Elevated LFTs We discussed the likely diagnosis of non-alcoholic fatty liver disease today and how this condition is obesity related. Leah Gilmore was educated the importance of weight loss. Leah Gilmore agreed to continue with her weight loss efforts with healthier diet and exercise as an essential  part of her treatment plan. We will repeat CMP in 3 months.  3. Anxiety and depression Behavior modification techniques were discussed today to help Leah Gilmore deal with her anxiety and depression. Leah Gilmore agreed to start Wellbutrin SR 100 mg PO q AM #30 with no refills. Orders and follow up as documented in patient record.   4. At risk for osteoporosis Leah Gilmore was given approximately 30 minutes of osteoporosis prevention counseling today. Leah Gilmore is at risk for osteopenia and osteoporosis due to her Vitamin D deficiency. She was encouraged to take her Vitamin D and follow her higher calcium diet and increase strengthening exercise to help strengthen her bones and decrease her risk of osteopenia and osteoporosis.  Repetitive spaced learning was employed today to elicit superior memory formation and behavioral change.  5. Class 1 obesity with serious comorbidity and body mass index (BMI) of 33.0 to 33.9 in adult, unspecified obesity type Leah Gilmore is currently in the action stage of change. As such, her goal is to continue with weight loss efforts. She has agreed to keeping a food journal and adhering to recommended goals of 1400-1500 calories and 95+ grams of protein daily.   Leah Gilmore is to start journaling 1 day of the weekend and 3 days of the work week.  Exercise goals: As is.  Behavioral modification strategies: increasing lean protein intake, increasing vegetables, meal planning and cooking strategies, keeping healthy foods in the home and planning for success.  Leah Gilmore has agreed to follow-up with our clinic in 2 weeks. She was  informed of the importance of frequent follow-up visits to maximize her success with intensive lifestyle modifications for her multiple health conditions.   Objective:   Blood pressure (!) 106/56, pulse 77, temperature 98.4 F (36.9 C), temperature source Oral, height 5\' 7"  (1.702 m), weight 214 lb (97.1 kg), last menstrual period 05/27/2019, SpO2 100 %. Body mass index is 33.52  kg/m.  General: Cooperative, alert, well developed, in no acute distress. HEENT: Conjunctivae and lids unremarkable. Cardiovascular: Regular rhythm.  Lungs: Normal work of breathing. Neurologic: No focal deficits.   Lab Results  Component Value Date   CREATININE 0.71 04/17/2019   BUN 17 04/17/2019   NA 140 04/17/2019   K 4.3 04/17/2019   CL 105 04/17/2019   CO2 24 04/17/2019   Lab Results  Component Value Date   ALT 38 (H) 04/17/2019   AST 26 04/17/2019   ALKPHOS 67 03/16/2018   BILITOT 1.1 04/17/2019   Lab Results  Component Value Date   HGBA1C 4.8 05/01/2019   HGBA1C 4.8 04/30/2019   No results found for: INSULIN Lab Results  Component Value Date   TSH 2.14 04/17/2019   Lab Results  Component Value Date   CHOL 168 04/17/2019   HDL 71 04/17/2019   LDLCALC 82 04/17/2019   TRIG 74 04/17/2019   CHOLHDL 2.4 04/17/2019   Lab Results  Component Value Date   WBC 4.0 04/17/2019   HGB 11.7 04/17/2019   HCT 36.9 04/17/2019   MCV 90.4 04/17/2019   PLT 324 04/17/2019   Lab Results  Component Value Date   IRON 113 04/17/2019   TIBC 417 04/17/2019   FERRITIN 13 (L) 04/17/2019   Attestation Statements:   Reviewed by clinician on day of visit: allergies, medications, problem list, medical history, surgical history, family history, social history, and previous encounter notes.   I, Seila Liston, am acting as transcriptionist for April Manson, MD.  I have reviewed the above documentation for accuracy and completeness, and I agree with the above. - Ilene Qua, MD

## 2019-06-25 NOTE — Telephone Encounter (Signed)
Please advise 

## 2019-07-03 ENCOUNTER — Other Ambulatory Visit: Payer: Self-pay

## 2019-07-03 ENCOUNTER — Ambulatory Visit (INDEPENDENT_AMBULATORY_CARE_PROVIDER_SITE_OTHER): Payer: No Typology Code available for payment source | Admitting: Family Medicine

## 2019-07-03 ENCOUNTER — Encounter (INDEPENDENT_AMBULATORY_CARE_PROVIDER_SITE_OTHER): Payer: Self-pay | Admitting: Family Medicine

## 2019-07-03 VITALS — BP 103/68 | HR 64 | Temp 98.0°F | Ht 67.0 in | Wt 209.0 lb

## 2019-07-03 DIAGNOSIS — E669 Obesity, unspecified: Secondary | ICD-10-CM

## 2019-07-03 DIAGNOSIS — E559 Vitamin D deficiency, unspecified: Secondary | ICD-10-CM | POA: Diagnosis not present

## 2019-07-03 DIAGNOSIS — F329 Major depressive disorder, single episode, unspecified: Secondary | ICD-10-CM | POA: Diagnosis not present

## 2019-07-03 DIAGNOSIS — F32A Depression, unspecified: Secondary | ICD-10-CM

## 2019-07-03 DIAGNOSIS — Z9189 Other specified personal risk factors, not elsewhere classified: Secondary | ICD-10-CM

## 2019-07-03 DIAGNOSIS — Z6832 Body mass index (BMI) 32.0-32.9, adult: Secondary | ICD-10-CM

## 2019-07-03 MED ORDER — VITAMIN D (ERGOCALCIFEROL) 1.25 MG (50000 UNIT) PO CAPS: 50000.0000 [IU] | ORAL_CAPSULE | ORAL | 0 refills | Status: DC

## 2019-07-04 NOTE — Progress Notes (Signed)
Chief Complaint:   OBESITY Leah Gilmore is here to discuss her progress with her obesity treatment plan along with follow-up of her obesity related diagnoses. Leah Gilmore is on keeping a food journal and adhering to recommended goals of 1400-1500 calories and 95+ grams of protein and states she is following her eating plan approximately 75% of the time. Leah Gilmore states she is biking for 30 minutes 3-4 times per week and working with a trainer for 45 minutes 1 time per week.  Today's visit was #: 3 Starting weight: 209 lbs Starting date: 04/30/2019 Today's weight: 209 lbs Today's date: 07/03/2019 Total lbs lost to date: 0 Total lbs lost since last in-office visit: 5 lbs  Interim History: Leah Gilmore says she is really enjoying the journaling as well as weighing all food and tracking everything.  Denies hunger.  She has increased her water intake as well.  She says she has been finding lower calorie sweets options for evening.  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  Subjective:   1. Vitamin D deficiency Leah Gilmore's Vitamin D level was 18 on 04/17/2019. She is currently taking prescription vitamin D 50,000 IU each week. She denies nausea, vomiting or muscle weakness.  She endorses fatigue.  2. Depression with emotional eating She reports that she isn't sure if the Wellbutrin is helping, but she does notice an improvement in cravings.  No SI/HI.  3. At risk for osteoporosis Leah Gilmore is at higher risk of osteopenia and osteoporosis due to Vitamin D deficiency.   Assessment/Plan:   1. Vitamin D deficiency Low Vitamin D level contributes to fatigue and are associated with obesity, breast, and colon cancer. She agrees to continue to take prescription Vitamin D @50 ,000 IU every week and will follow-up for routine testing of Vitamin D, at least 2-3 times per year to avoid over-replacement. - Vitamin D, Ergocalciferol, (DRISDOL) 1.25 MG (50000 UNIT) CAPS capsule; Take 1 capsule (50,000 Units total) by mouth once a  week.  Dispense: 12 capsule; Refill: 0  2. Depression with emotional eating Behavior modification techniques were discussed today to help Leah Gilmore deal with her emotional/non-hunger eating behaviors.  Orders and follow up as documented in patient record.  She will continue taking Wellbutrin.  3. At risk for osteoporosis Leah Gilmore was given approximately 15 minutes of osteoporosis prevention counseling today. Leah Gilmore is at risk for osteopenia and osteoporosis due to her Vitamin D deficiency. She was encouraged to take her Vitamin D and follow her higher calcium diet and increase strengthening exercise to help strengthen her bones and decrease her risk of osteopenia and osteoporosis.  Repetitive spaced learning was employed today to elicit superior memory formation and behavioral change.  4. Class 1 obesity with serious comorbidity and body mass index (BMI) of 32.0 to 32.9 in adult, unspecified obesity type Leah Gilmore is currently in the action stage of change. As such, her goal is to continue with weight loss efforts. She has agreed to keeping a food journal and adhering to recommended goals of 1400-1500 calories and 95+ grams of protein.   Exercise goals: As is.  Behavioral modification strategies: increasing lean protein intake, increasing vegetables, meal planning and cooking strategies, keeping healthy foods in the home and planning for success.  Leah Gilmore has agreed to follow-up with our clinic in 4 weeks. She was informed of the importance of frequent follow-up visits to maximize her success with intensive lifestyle modifications for her multiple health conditions.   Objective:   Blood pressure 103/68, pulse 64, temperature  8 F (36.7 C), temperature source Oral, height 5\' 7"  (1.702 m), weight 209 lb (94.8 kg), last menstrual period 06/24/2019, SpO2 99 %. Body mass index is 32.73 kg/m.  General: Cooperative, alert, well developed, in no acute distress. HEENT: Conjunctivae and lids unremarkable. Cardiovascular:  Regular rhythm.  Lungs: Normal work of breathing. Neurologic: No focal deficits.   Lab Results  Component Value Date   CREATININE 0.71 04/17/2019   BUN 17 04/17/2019   NA 140 04/17/2019   K 4.3 04/17/2019   CL 105 04/17/2019   CO2 24 04/17/2019   Lab Results  Component Value Date   ALT 38 (H) 04/17/2019   AST 26 04/17/2019   ALKPHOS 67 03/16/2018   BILITOT 1.1 04/17/2019   Lab Results  Component Value Date   HGBA1C 4.8 05/01/2019   HGBA1C 4.8 04/30/2019   Lab Results  Component Value Date   TSH 2.14 04/17/2019   Lab Results  Component Value Date   CHOL 168 04/17/2019   HDL 71 04/17/2019   LDLCALC 82 04/17/2019   TRIG 74 04/17/2019   CHOLHDL 2.4 04/17/2019   Lab Results  Component Value Date   WBC 4.0 04/17/2019   HGB 11.7 04/17/2019   HCT 36.9 04/17/2019   MCV 90.4 04/17/2019   PLT 324 04/17/2019   Lab Results  Component Value Date   IRON 113 04/17/2019   TIBC 417 04/17/2019   FERRITIN 13 (L) 04/17/2019   Attestation Statements:   Reviewed by clinician on day of visit: allergies, medications, problem list, medical history, surgical history, family history, social history, and previous encounter notes.  I, Water quality scientist, CMA, am acting as transcriptionist for Coralie Common, MD.  I have reviewed the above documentation for accuracy and completeness, and I agree with the above. - Ilene Qua, MD

## 2019-07-13 ENCOUNTER — Other Ambulatory Visit (INDEPENDENT_AMBULATORY_CARE_PROVIDER_SITE_OTHER): Payer: Self-pay | Admitting: Family Medicine

## 2019-07-13 DIAGNOSIS — F32A Depression, unspecified: Secondary | ICD-10-CM

## 2019-07-13 DIAGNOSIS — F419 Anxiety disorder, unspecified: Secondary | ICD-10-CM

## 2019-07-19 ENCOUNTER — Ambulatory Visit (INDEPENDENT_AMBULATORY_CARE_PROVIDER_SITE_OTHER): Payer: No Typology Code available for payment source | Admitting: Family Medicine

## 2019-07-19 ENCOUNTER — Other Ambulatory Visit: Payer: Self-pay

## 2019-07-19 VITALS — BP 101/57 | HR 71 | Temp 98.3°F | Ht 67.0 in | Wt 210.0 lb

## 2019-07-19 DIAGNOSIS — K219 Gastro-esophageal reflux disease without esophagitis: Secondary | ICD-10-CM

## 2019-07-19 DIAGNOSIS — E669 Obesity, unspecified: Secondary | ICD-10-CM | POA: Diagnosis not present

## 2019-07-19 DIAGNOSIS — Z9189 Other specified personal risk factors, not elsewhere classified: Secondary | ICD-10-CM

## 2019-07-19 DIAGNOSIS — F419 Anxiety disorder, unspecified: Secondary | ICD-10-CM | POA: Diagnosis not present

## 2019-07-19 DIAGNOSIS — Z6833 Body mass index (BMI) 33.0-33.9, adult: Secondary | ICD-10-CM

## 2019-07-19 DIAGNOSIS — F32A Depression, unspecified: Secondary | ICD-10-CM

## 2019-07-19 DIAGNOSIS — F329 Major depressive disorder, single episode, unspecified: Secondary | ICD-10-CM

## 2019-07-19 MED ORDER — BUPROPION HCL ER (SR) 150 MG PO TB12: 150.0000 mg | ORAL_TABLET | Freq: Every day | ORAL | 0 refills | Status: DC

## 2019-07-23 NOTE — Progress Notes (Signed)
Chief Complaint:   OBESITY Leah Gilmore is here to discuss her progress with her obesity treatment plan along with follow-up of her obesity related diagnoses. Leah Gilmore is on keeping a food journal and adhering to recommended goals of 1400-1500 calories and 95+ grams of protein and states she is following her eating plan approximately 90% of the time. Leah Gilmore states she is weightlifting for 45 minutes 1 time per week and doing cardio and the exercise bike for 30 minutes 3 times per week.  Today's visit was #: 4 Starting weight: 209 lbs Starting date: 04/30/2019 Today's weight: 210 lbs Today's date: 07/19/2019 Total lbs lost to date: 0 Total lbs lost since last in-office visit: 0  Interim History: Leah Gilmore says that she believes she is about to start her menstrual cycle and exceeded her sodium level yesterday.  Tracking is going well.  She tracked for 10/12 days.  She finds that tracking is easy and allows her to deal with day-to-day changes.  This weekend, the patient is going to [place].  Subjective:   1. Gastroesophageal reflux disease, unspecified whether esophagitis present She has been experiencing acid reflux. She takes Pepcid daily and symptoms are well-controlled.  2. Anxiety and depression Quanta is struggling with emotional eating and using food for comfort to the extent that it is negatively impacting her health. She has been working on behavior modification techniques to help reduce her emotional eating and has been somewhat successful. She shows no sign of suicidal or homicidal ideations.  She says her symptoms are better controlled.  3. At risk for osteoporosis [name] is at higher risk of osteopenia and osteoporosis due to Vitamin D deficiency.   Assessment/Plan:   1. Gastroesophageal reflux disease, unspecified whether esophagitis present Intensive lifestyle modifications are the first line treatment for this issue. We discussed several lifestyle modifications today and she will continue to work  on diet, exercise and weight loss efforts. Orders and follow up as documented in patient record.   Counseling . If a person has gastroesophageal reflux disease (GERD), food and stomach acid move back up into the esophagus and cause symptoms or problems such as damage to the esophagus. . Anti-reflux measures include: raising the head of the bed, avoiding tight clothing or belts, avoiding eating late at night, not lying down shortly after mealtime, and achieving weight loss. . Avoid ASA, NSAID's, caffeine, alcohol, and tobacco.  . OTC Pepcid and/or Tums are often very helpful for as needed use.  Leah Gilmore However, for persisting chronic or daily symptoms, stronger medications like Omeprazole may be needed. . You may need to avoid foods and drinks such as: ? Coffee and tea (with or without caffeine). ? Drinks that contain alcohol. ? Energy drinks and sports drinks. ? Bubbly (carbonated) drinks or sodas. ? Chocolate and cocoa. ? Peppermint and mint flavorings. ? Garlic and onions. ? Horseradish. ? Spicy and acidic foods. These include peppers, chili powder, curry powder, vinegar, hot sauces, and BBQ sauce. ? Citrus fruit juices and citrus fruits, such as oranges, lemons, and limes. ? Tomato-based foods. These include red sauce, chili, salsa, and pizza with red sauce. ? Fried and fatty foods. These include donuts, french fries, potato chips, and high-fat dressings. ? High-fat meats. These include hot dogs, rib eye steak, sausage, ham, and bacon.  2. Anxiety and depression Behavior modification techniques were discussed today to help Leah Gilmore deal with her emotional/non-hunger eating behaviors.  Orders and follow up as documented in patient record.  - buPROPion [facility] SR)  150 MG 12 hr tablet; Take 1 tablet (150 mg total) by mouth daily.  Dispense: 30 tablet; Refill: 0  3. At risk for osteoporosis Leah Gilmore was given approximately 15 minutes of osteoporosis prevention counseling today. Leah Gilmore is at risk for  osteopenia and osteoporosis due to her Vitamin D deficiency. She was encouraged to take her Vitamin D and follow her higher calcium diet and increase strengthening exercise to help strengthen her bones and decrease her risk of osteopenia and osteoporosis.  Repetitive spaced learning was employed today to elicit superior memory formation and behavioral change.  4. Class 1 obesity with serious comorbidity and body mass index (BMI) of 33.0 to 33.9 in adult, unspecified obesity type Leah Gilmore is currently in the action stage of change. As such, her goal is to continue with weight loss efforts. She has agreed to keeping a food journal and adhering to recommended goals of 1400-1500 calories and 95+ grams of protein.   Exercise goals: As is.  Behavioral modification strategies: increasing lean protein intake, increasing vegetables, meal planning and cooking strategies, keeping healthy foods in the home and planning for success.  Leah Gilmore has agreed to follow-up with our clinic in 2 weeks. She was informed of the importance of frequent follow-up visits to maximize her success with intensive lifestyle modifications for her multiple health conditions.   Objective:   Blood pressure (!) 101/57, pulse 71, temperature 98.3 F (36.8 C), temperature source Oral, height 5\' 7"  (1.702 m), weight 210 lb (95.3 kg), last menstrual period 06/24/2019, SpO2 100 %. Body mass index is 32.89 kg/m.  General: Cooperative, alert, well developed, in no acute distress. HEENT: Conjunctivae and lids unremarkable. Cardiovascular: Regular rhythm.  Lungs: Normal work of breathing. Neurologic: No focal deficits.   Lab Results  Component Value Date   CREATININE 0.71 04/17/2019   BUN 17 04/17/2019   NA 140 04/17/2019   K 4.3 04/17/2019   CL 105 04/17/2019   CO2 24 04/17/2019   Lab Results  Component Value Date   ALT 38 (H) 04/17/2019   AST 26 04/17/2019   ALKPHOS 67 03/16/2018   BILITOT 1.1 04/17/2019   Lab Results    Component Value Date   HGBA1C 4.8 05/01/2019   HGBA1C 4.8 04/30/2019   Lab Results  Component Value Date   TSH 2.14 04/17/2019   Lab Results  Component Value Date   CHOL 168 04/17/2019   HDL 71 04/17/2019   LDLCALC 82 04/17/2019   TRIG 74 04/17/2019   CHOLHDL 2.4 04/17/2019   Lab Results  Component Value Date   WBC 4.0 04/17/2019   HGB 11.7 04/17/2019   HCT 36.9 04/17/2019   MCV 90.4 04/17/2019   PLT 324 04/17/2019   Lab Results  Component Value Date   IRON 113 04/17/2019   TIBC 417 04/17/2019   FERRITIN 13 (L) 04/17/2019   Attestation Statements:   Reviewed by clinician on day of visit: allergies, medications, problem list, medical history, surgical history, family history, social history, and previous encounter notes.  I, Water quality scientist, CMA, am acting as transcriptionist for Coralie Common, MD.  I have reviewed the above documentation for accuracy and completeness, and I agree with the above. - Jinny Blossom, MD

## 2019-08-09 ENCOUNTER — Encounter (INDEPENDENT_AMBULATORY_CARE_PROVIDER_SITE_OTHER): Payer: Self-pay | Admitting: Family Medicine

## 2019-08-09 ENCOUNTER — Ambulatory Visit (INDEPENDENT_AMBULATORY_CARE_PROVIDER_SITE_OTHER): Payer: No Typology Code available for payment source | Admitting: Family Medicine

## 2019-08-09 ENCOUNTER — Other Ambulatory Visit: Payer: Self-pay

## 2019-08-09 VITALS — BP 101/69 | HR 85 | Temp 98.4°F | Ht 67.0 in | Wt 214.0 lb

## 2019-08-09 DIAGNOSIS — R7401 Elevation of levels of liver transaminase levels: Secondary | ICD-10-CM | POA: Diagnosis not present

## 2019-08-09 DIAGNOSIS — Z9189 Other specified personal risk factors, not elsewhere classified: Secondary | ICD-10-CM | POA: Diagnosis not present

## 2019-08-09 DIAGNOSIS — Z6833 Body mass index (BMI) 33.0-33.9, adult: Secondary | ICD-10-CM

## 2019-08-09 DIAGNOSIS — E669 Obesity, unspecified: Secondary | ICD-10-CM

## 2019-08-09 DIAGNOSIS — F3289 Other specified depressive episodes: Secondary | ICD-10-CM

## 2019-08-09 MED ORDER — BUPROPION HCL ER (SR) 150 MG PO TB12: 150.0000 mg | ORAL_TABLET | Freq: Every day | ORAL | 0 refills | Status: DC

## 2019-08-13 NOTE — Progress Notes (Signed)
Chief Complaint:   OBESITY Leah Gilmore is here to discuss her progress with her obesity treatment plan along with follow-up of her obesity related diagnoses. Leah Gilmore is on keeping a food journal and adhering to recommended goals of 1400-1500 calories and 95+ grams of protein and states she is following her eating plan approximately 5% of the time. Leah Gilmore states she is working out for 45 minutes 1 time per week and riding a bike for 30 minutes 1 times per week.  Today's visit was #: 5 Starting weight: 209 lbs Starting date: 04/30/2019 Today's weight: 214 lbs Today's date: 08/09/2019 Total lbs lost to date: 0 Total lbs lost since last in-office visit: 0  Interim History: Leah Gilmore went to [facility] over the last few weeks. She saw dolphins at the aquarium and spent time with family. She is going to [employer] this weekend.  The last few weeks, she says she has not tracked and is doing more eating out.  She does not anticipate it being difficult to track while at the beach.  Subjective:   1. Transaminitis See labs below.  She has not had any imaging.  Lab Results  Component Value Date   ALT 38 (H) 04/17/2019   AST 26 04/17/2019   ALKPHOS 67 03/16/2018   BILITOT 1.1 04/17/2019   2. Other depression, with emotional eating No SI/HI.  Leah Gilmore says that her symptoms are better controlled on Wellbutrin.  3. At risk for impaired function of liver Leah Gilmore is at risk for impaired function of liver due to likely diagnosis of fatty liver as evidenced by recent elevated liver enzymes.   Assessment/Plan:   1. Transaminitis Will follow up labs in the next month.  2. Other depression, with emotional eating Behavior modification techniques were discussed today to help Leah Gilmore deal with her emotional/non-hunger eating behaviors.  Orders and follow up as documented in patient record.  - buPROPion (WELLBUTRIN SR) 150 MG 12 hr tablet; Take 1 tablet (150 mg total) by mouth daily.  Dispense: 30 tablet; Refill: 0  3. At  risk for impaired function of liver Leah Gilmore was given approximately 15 minutes of counseling today regarding prevention of impaired liver function. Leah Gilmore was educated about her risk of developing NASH or even liver failure and advised that the only proven treatment for NAFLD was weight loss of at least 5-10% of body weight.   4. Class 1 obesity with serious comorbidity and body mass index (BMI) of 33.0 to 33.9 in adult, unspecified obesity type Leah Gilmore is currently in the action stage of change. As such, her goal is to continue with weight loss efforts. She has agreed to keeping a food journal and adhering to recommended goals of 1400-1500 calories and 90+ grams of protein.   Exercise goals: As is.  Behavioral modification strategies: increasing lean protein intake, keeping healthy foods in the home, planning for success and keeping a strict food journal.  Leah Gilmore has agreed to follow-up with our clinic in 2.5 weeks. She was informed of the importance of frequent follow-up visits to maximize her success with intensive lifestyle modifications for her multiple health conditions.   Objective:   Blood pressure 101/69, pulse 85, temperature 98.4 F (36.9 C), temperature source Oral, height 5\' 7"  (1.702 m), weight 214 lb (97.1 kg), last menstrual period 07/26/2019, SpO2 100 %. Body mass index is 33.52 kg/m.  General: Cooperative, alert, well developed, in no acute distress. HEENT: Conjunctivae and lids unremarkable. Cardiovascular: Regular rhythm.  Lungs: Normal work of  breathing. Neurologic: No focal deficits.   Lab Results  Component Value Date   CREATININE 0.71 04/17/2019   BUN 17 04/17/2019   NA 140 04/17/2019   K 4.3 04/17/2019   CL 105 04/17/2019   CO2 24 04/17/2019   Lab Results  Component Value Date   ALT 38 (H) 04/17/2019   AST 26 04/17/2019   ALKPHOS 67 03/16/2018   BILITOT 1.1 04/17/2019   Lab Results  Component Value Date   HGBA1C 4.8 05/01/2019   HGBA1C 4.8 04/30/2019   Lab  Results  Component Value Date   TSH 2.14 04/17/2019   Lab Results  Component Value Date   CHOL 168 04/17/2019   HDL 71 04/17/2019   LDLCALC 82 04/17/2019   TRIG 74 04/17/2019   CHOLHDL 2.4 04/17/2019   Lab Results  Component Value Date   WBC 4.0 04/17/2019   HGB 11.7 04/17/2019   HCT 36.9 04/17/2019   MCV 90.4 04/17/2019   PLT 324 04/17/2019   Lab Results  Component Value Date   IRON 113 04/17/2019   TIBC 417 04/17/2019   FERRITIN 13 (L) 04/17/2019   Attestation Statements:   Reviewed by clinician on day of visit: allergies, medications, problem list, medical history, surgical history, family history, social history, and previous encounter notes.  I, Insurance claims handler, CMA, am acting as transcriptionist for Reuben Likes, MD.  I have reviewed the above documentation for accuracy and completeness, and I agree with the above. - Katherina Mires, MD

## 2019-08-28 ENCOUNTER — Telehealth (INDEPENDENT_AMBULATORY_CARE_PROVIDER_SITE_OTHER): Payer: No Typology Code available for payment source | Admitting: Family Medicine

## 2019-08-28 ENCOUNTER — Encounter (INDEPENDENT_AMBULATORY_CARE_PROVIDER_SITE_OTHER): Payer: Self-pay | Admitting: Family Medicine

## 2019-08-28 ENCOUNTER — Encounter: Payer: Self-pay | Admitting: Family Medicine

## 2019-08-28 VITALS — Temp 98.1°F | Wt 210.8 lb

## 2019-08-28 DIAGNOSIS — R197 Diarrhea, unspecified: Secondary | ICD-10-CM | POA: Diagnosis not present

## 2019-08-28 DIAGNOSIS — R110 Nausea: Secondary | ICD-10-CM | POA: Diagnosis not present

## 2019-08-28 DIAGNOSIS — J069 Acute upper respiratory infection, unspecified: Secondary | ICD-10-CM

## 2019-08-28 NOTE — Patient Instructions (Addendum)
Stay hydrated; drink fluids with electrolytes if you are unable to eat. Eat bland diet and avoid dairy for 1 week.  Nasal saline twice daily.  Can try topical tiger balm if needed if can not take analgesics.  I hope you are feeling better soon! Seek care promptly if your symptoms worsen, new concerns arise or you are not improving with treatment.      WORK SLIP:  Patient Leah Gilmore,  09-30-1985, was seen for a medical visit today, 08/28/19. Please excuse from work until fevers, vomiting and diarrhea have resolved for 48 hours and she is feeling better.  Sincerely: E-signature: Dr. Kriste Basque, DO [facility] [place] Ph: [phone]

## 2019-08-28 NOTE — Progress Notes (Addendum)
Virtual Visit via Video Note  I connected with Leah Gilmore  on 08/28/19 at 11:20 AM EDT by a video enabled telemedicine application and verified that I am speaking with the correct person using two identifiers.  Location patient: home, [place] Location provider:work or home office Persons participating in the virtual visit: patient, provider  I discussed the limitations of evaluation and management by telemedicine and the availability of in person appointments. The patient expressed understanding and agreed to proceed.   HPI:  Acute visit for sinus issues: -started 4 days ago -symptoms included scratchy throat, nasal congestion, HA, PND, cough some, nausea, vomiting x 1, sinus pressure,  diarrhea x1, less energy than usually -she did a covid test yesterday and it was negative -denies fevers, body aches, SOB, wheezing, loss of taste  -her wife works at a childcare center and she had similar symptoms recently, 2 month old and 34 yo all had similar symptoms recently - has gone through the house -3 cases of COVID in the house in March 2021 -fully vaccinated for COVID19    ROS: See pertinent positives and negatives per HPI.  Past Medical History:  Diagnosis Date  . Anemia    Never had a blood transfusion  . Iron deficiency anemia 03/11/2017  . Multiple food allergies   . PCOS (polycystic ovarian syndrome) 03/29/2016  . 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 Vitamin D deficiency     Past Surgical History:  Procedure Laterality Date  . GASTRIC ROUX-EN-Y N/A 03/29/2016   Procedure: LAPAROSCOPIC ROUX-EN-Y GASTRIC BYPASS WITH UPPER ENDOSCOPY;  Surgeon: Gaynelle Adu, MD;  Location: [facility];  Service: General;  Laterality: N/A;  . UPPER GI ENDOSCOPY  03/29/2016   Procedure: UPPER GI ENDOSCOPY;  Surgeon: Gaynelle Adu, MD;  Location: [facility];  Service: General;;    Family History  Problem Relation Age of Onset  . Thyroid disease Mother   . Depression Mother   . Anxiety disorder Mother   . Obesity Mother   .  Cancer Father 10       lung cancer, smoker  . Alcohol abuse Father   . Drug abuse Father   . Liver disease Father   . Diabetes Maternal Grandmother   . Cancer Maternal Grandfather        prostate  . Diabetes Paternal Grandmother   . Hypertension Paternal Grandmother     SOCIAL HX: see hpi   Current Outpatient Medications:  .  buPROPion (WELLBUTRIN SR) 150 MG 12 hr tablet, Take 1 tablet (150 mg total) by mouth daily., Disp: 30 tablet, Rfl: 0 .  Calcium 500 MG tablet, Take 500 mg by mouth 3 (three) times daily., Disp: , Rfl:  .  Multiple Vitamins-Iron (MULTIVITAMIN/IRON PO), Take 1 tablet by mouth 2 (two) times daily., Disp: , Rfl:  .  Vitamin D, Cholecalciferol, 50 MCG (2000 UT) CAPS, Take 2,000 Units by mouth., Disp: , Rfl:  .  Vitamin D, Ergocalciferol, (DRISDOL) 1.25 MG (50000 UNIT) CAPS capsule, Take 1 capsule (50,000 Units total) by mouth once a week., Disp: 12 capsule, Rfl: 0  EXAM:  VITALS per patient if applicable:  GENERAL: alert, oriented, appears well and in no acute distress  HEENT: atraumatic, conjunttiva clear, no obvious abnormalities on inspection of external nose and ears  NECK: normal movements of the head and neck  LUNGS: on inspection no signs of respiratory distress, breathing rate appears normal, no obvious gross SOB, gasping or wheezing  CV: no obvious cyanosis  MS: moves all visible extremities without  noticeable abnormality  PSYCH/NEURO: pleasant and cooperative, no obvious depression or anxiety, speech and thought processing grossly intact  ASSESSMENT AND PLAN:  Discussed the following assessment and plan:  Viral upper respiratory illness  Nausea  Diarrhea, unspecified type  -we discussed possible serious and likely etiologies, options for evaluation and workup, limitations of telemedicine visit vs in person visit, treatment, treatment risks and precautions. Pt prefers to treat via telemedicine empirically rather then risking or undertaking  an in person visit at this moment. Query viral illness given went thorough the household. She is fully vaccinated for COVID19 and had a negative COVID19 test. Discussed symptomatic care with OTC meds imodium, nasal sprays, analgesics. Work note provided. Patient agrees to seek prompt in person care if worsening, new symptoms arise, or if is not improving with treatment.   I discussed the assessment and treatment plan with the patient. The patient was provided an opportunity to ask questions and all were answered. The patient agreed with the plan and demonstrated an understanding of the instructions.   The patient was advised to call back or seek an in-person evaluation if the symptoms worsen or if the condition fails to improve as anticipated.   Lucretia Kern, DO

## 2019-08-30 ENCOUNTER — Ambulatory Visit (INDEPENDENT_AMBULATORY_CARE_PROVIDER_SITE_OTHER): Payer: No Typology Code available for payment source | Admitting: Family Medicine

## 2019-09-06 ENCOUNTER — Encounter (INDEPENDENT_AMBULATORY_CARE_PROVIDER_SITE_OTHER): Payer: Self-pay | Admitting: Family Medicine

## 2019-09-06 ENCOUNTER — Ambulatory Visit (INDEPENDENT_AMBULATORY_CARE_PROVIDER_SITE_OTHER): Payer: No Typology Code available for payment source | Admitting: Family Medicine

## 2019-09-06 ENCOUNTER — Other Ambulatory Visit: Payer: Self-pay

## 2019-09-06 VITALS — BP 97/64 | HR 69 | Temp 98.3°F | Ht 67.0 in | Wt 217.0 lb

## 2019-09-06 DIAGNOSIS — Z9189 Other specified personal risk factors, not elsewhere classified: Secondary | ICD-10-CM | POA: Diagnosis not present

## 2019-09-06 DIAGNOSIS — E669 Obesity, unspecified: Secondary | ICD-10-CM | POA: Diagnosis not present

## 2019-09-06 DIAGNOSIS — F3289 Other specified depressive episodes: Secondary | ICD-10-CM

## 2019-09-06 DIAGNOSIS — E8881 Metabolic syndrome: Secondary | ICD-10-CM | POA: Diagnosis not present

## 2019-09-06 DIAGNOSIS — Z6834 Body mass index (BMI) 34.0-34.9, adult: Secondary | ICD-10-CM

## 2019-09-12 NOTE — Progress Notes (Signed)
Chief Complaint:   OBESITY Leah Gilmore is here to discuss her progress with her obesity treatment plan along with follow-up of her obesity related diagnoses. Leah Gilmore is on keeping a food journal and adhering to recommended goals of 1400-1500 calories and 90+ grams of protein daily and states she is following her eating plan approximately 35% of the time. Leah Gilmore states she is exercising with a personal trainer for 45 minutes 1 time per week.  Today's visit was #: 6 Starting weight: 209 lbs Starting date: 04/30/2019 Today's weight: 217 lbs Today's date: 09/06/2019 Total lbs lost to date: 0 Total lbs lost since last in-office visit: 0  Interim History: Leah Gilmore said she isn't eating nutritious food. She is doing more indulgent food and tends to drop off after breakfast. She says some days it depends on how she's feeling. She is having significant cravings especially after dinner and from 2-4 pm.  Subjective:   1. Insulin resistance Leah Gilmore's last insulin level was 5.6 and A1c 4.8. She has a past history of polycystic ovarian syndrome but based on irregular menses only.  2. Other depression, with emotional eating Leah Gilmore denies suicidal ideas or homicidal ideas. She is on Wellbutrin 150 mg daily with no real improvement in symptoms.  3. At risk for diabetes mellitus Leah Gilmore is at higher than average risk for developing diabetes due to her obesity.   Assessment/Plan:   1. Insulin resistance Leah Gilmore will continue to work on weight loss, exercise, and decreasing simple carbohydrates to help decrease the risk of diabetes. We will check labs at her next appointment. Leah Gilmore agreed to follow-up with [name] as directed to closely monitor her progress.  2. Other depression, with emotional eating Behavior modification techniques were discussed today to help Leah Gilmore deal with her emotional/non-hunger eating behaviors. Leah Gilmore agreed to discontinue Wellbutrin. Orders and follow up as documented in patient record.   3. At risk for diabetes  mellitus Leah Gilmore was given approximately 15 minutes of diabetes education and counseling today. We discussed intensive lifestyle modifications today with an emphasis on weight loss as well as increasing exercise and decreasing simple carbohydrates in her diet. We also reviewed medication options with an emphasis on risk versus benefit of those discussed.   Repetitive spaced learning was employed today to elicit superior memory formation and behavioral change.  4. Class 1 obesity with serious comorbidity and body mass index (BMI) of 34.0 to 34.9 in adult, unspecified obesity type Leah Gilmore is currently in the action stage of change. As such, her goal is to continue with weight loss efforts. She has agreed to following a lower carbohydrate, vegetable and lean protein rich diet plan.   Exercise goals: All adults should avoid inactivity. Some physical activity is better than none, and adults who participate in any amount of physical activity gain some health benefits.  Behavioral modification strategies: increasing lean protein intake, meal planning and cooking strategies and keeping healthy foods in the home.  Leah Gilmore has agreed to follow-up with our clinic in 2 weeks. She was informed of the importance of frequent follow-up visits to maximize her success with intensive lifestyle modifications for her multiple health conditions.   Objective:   Blood pressure 97/64, pulse 69, temperature 98.3 F (36.8 C), temperature source Oral, height 5\' 7"  (1.702 m), weight 217 lb (98.4 kg), last menstrual period 08/23/2019, SpO2 100 %. Body mass index is 33.99 kg/m.  General: Cooperative, alert, well developed, in no acute distress. HEENT: Conjunctivae and lids unremarkable. Cardiovascular: Regular rhythm.  Lungs:  Normal work of breathing. Neurologic: No focal deficits.   Lab Results  Component Value Date   CREATININE 0.71 04/17/2019   BUN 17 04/17/2019   NA 140 04/17/2019   K 4.3 04/17/2019   CL 105 04/17/2019    CO2 24 04/17/2019   Lab Results  Component Value Date   ALT 38 (H) 04/17/2019   AST 26 04/17/2019   ALKPHOS 67 03/16/2018   BILITOT 1.1 04/17/2019   Lab Results  Component Value Date   HGBA1C 4.8 05/01/2019   HGBA1C 4.8 04/30/2019   No results found for: INSULIN Lab Results  Component Value Date   TSH 2.14 04/17/2019   Lab Results  Component Value Date   CHOL 168 04/17/2019   HDL 71 04/17/2019   LDLCALC 82 04/17/2019   TRIG 74 04/17/2019   CHOLHDL 2.4 04/17/2019   Lab Results  Component Value Date   WBC 4.0 04/17/2019   HGB 11.7 04/17/2019   HCT 36.9 04/17/2019   MCV 90.4 04/17/2019   PLT 324 04/17/2019   Lab Results  Component Value Date   IRON 113 04/17/2019   TIBC 417 04/17/2019   FERRITIN 13 (L) 04/17/2019   Attestation Statements:   Reviewed by clinician on day of visit: allergies, medications, problem list, medical history, surgical history, family history, social history, and previous encounter notes.    I, Ivone Licht, am acting as transcriptionist for Reuben Likes, MD.  I have reviewed the above documentation for accuracy and completeness, and I agree with the above. - Katherina Mires, MD

## 2019-09-24 ENCOUNTER — Other Ambulatory Visit: Payer: Self-pay

## 2019-09-24 ENCOUNTER — Encounter (INDEPENDENT_AMBULATORY_CARE_PROVIDER_SITE_OTHER): Payer: Self-pay | Admitting: Family Medicine

## 2019-09-24 ENCOUNTER — Ambulatory Visit (INDEPENDENT_AMBULATORY_CARE_PROVIDER_SITE_OTHER): Payer: No Typology Code available for payment source | Admitting: Family Medicine

## 2019-09-24 VITALS — BP 106/61 | HR 65 | Temp 98.5°F | Ht 67.0 in | Wt 215.0 lb

## 2019-09-24 DIAGNOSIS — E669 Obesity, unspecified: Secondary | ICD-10-CM

## 2019-09-24 DIAGNOSIS — Z6833 Body mass index (BMI) 33.0-33.9, adult: Secondary | ICD-10-CM

## 2019-09-24 DIAGNOSIS — R5383 Other fatigue: Secondary | ICD-10-CM

## 2019-09-24 DIAGNOSIS — R7401 Elevation of levels of liver transaminase levels: Secondary | ICD-10-CM | POA: Diagnosis not present

## 2019-09-24 DIAGNOSIS — E559 Vitamin D deficiency, unspecified: Secondary | ICD-10-CM

## 2019-09-24 DIAGNOSIS — Z9189 Other specified personal risk factors, not elsewhere classified: Secondary | ICD-10-CM

## 2019-09-24 MED ORDER — VITAMIN D (ERGOCALCIFEROL) 1.25 MG (50000 UNIT) PO CAPS: 50000.0000 [IU] | ORAL_CAPSULE | ORAL | 0 refills | Status: DC

## 2019-09-26 NOTE — Progress Notes (Signed)
Chief Complaint:   OBESITY Leah Gilmore is here to discuss her progress with her obesity treatment plan along with follow-up of her obesity related diagnoses. Leah Gilmore is on following a lower carbohydrate, vegetable and lean protein rich diet plan and states she is following her eating plan approximately 60-70% of the time. Leah Gilmore states she is exercising with a personal trainer for 45 minutes 1 time per week.  Today's visit was #: 7 Starting weight: 209 lbs Starting date: 04/30/2019 Today's weight: 215 lbs Today's date: 09/24/2019 Total lbs lost to date: 0 Total lbs lost since last in-office visit: 2  Interim History: Leah Gilmore didn't find the Low carbohydrate too bad, her wife is doing the meal plan with her. Overall the meal plan was relatively easy. She has no plans to go anywhere or do anything over the last few weeks. She denies any obstacles.  Subjective:   1. Vitamin D deficiency Leah Gilmore denies nausea, vomiting, or muscle weakness, but she notes fatigue. Last Vit D level was 18.  2. Transaminitis Leah Gilmore's last ALT was 38, AST 26 in February 2021.  3. Other fatigue Leah Gilmore's last ferritin was 13 and CBC within normal limits.  4. At risk for osteoporosis Leah Gilmore is at higher risk of osteopenia and osteoporosis due to Vitamin D deficiency.   Assessment/Plan:   1. Vitamin D deficiency Low Vitamin D level contributes to fatigue and are associated with obesity, breast, and colon cancer. We will refill prescription Vitamin D for 1 month. Leah Gilmore will follow-up for routine testing of Vitamin D, at least 2-3 times per year to avoid over-replacement. We will check labs today.  - Vitamin D, Ergocalciferol, (DRISDOL) 1.25 MG (50000 UNIT) CAPS capsule; Take 1 capsule (50,000 Units total) by mouth once a week.  Dispense: 4 capsule; Refill: 0 - VITAMIN D 25 Hydroxy (Vit-D Deficiency, Fractures)  2. Transaminitis We will check labs today. Leah Gilmore will continue to follow up as directed.  - Comprehensive metabolic panel  3.  Other fatigue We will check labs today. Leah Gilmore will continue to follow up as directed.  - Anemia panel - CBC with Differential/Platelet  4. At risk for osteoporosis Leah Gilmore was given approximately 15 minutes of osteoporosis prevention counseling today. Leah Gilmore is at risk for osteopenia and osteoporosis due to her Vitamin D deficiency. She was encouraged to take her Vitamin D and follow her higher calcium diet and increase strengthening exercise to help strengthen her bones and decrease her risk of osteopenia and osteoporosis.  Repetitive spaced learning was employed today to elicit superior memory formation and behavioral change.  5. Class 1 obesity with serious comorbidity and body mass index (BMI) of 33.0 to 33.9 in adult, unspecified obesity type Leah Gilmore is currently in the action stage of change. As such, her goal is to continue with weight loss efforts. She has agreed to following a lower carbohydrate, vegetable and lean protein rich diet plan.   Exercise goals: As is.  Behavioral modification strategies: increasing lean protein intake, increasing vegetables, meal planning and cooking strategies and keeping healthy foods in the home.  Leah Gilmore has agreed to follow-up with our clinic in 2 weeks. She was informed of the importance of frequent follow-up visits to maximize her success with intensive lifestyle modifications for her multiple health conditions.   Leah Gilmore was informed we would discuss her lab results at her next visit unless there is a critical issue that needs to be addressed sooner. Leah Gilmore agreed to keep her next visit at the agreed upon time  to discuss these results.  Objective:   Blood pressure 106/61, pulse 65, temperature 98.5 F (36.9 C), temperature source Oral, height 5\' 7"  (1.702 m), weight 215 lb (97.5 kg), last menstrual period 09/23/2019, SpO2 100 %. Body mass index is 33.67 kg/m.  General: Cooperative, alert, well developed, in no acute distress. HEENT: Conjunctivae and lids  unremarkable. Cardiovascular: Regular rhythm.  Lungs: Normal work of breathing. Neurologic: No focal deficits.   Lab Results  Component Value Date   CREATININE 0.71 04/17/2019   BUN 17 04/17/2019   NA 140 04/17/2019   K 4.3 04/17/2019   CL 105 04/17/2019   CO2 24 04/17/2019   Lab Results  Component Value Date   ALT 38 (H) 04/17/2019   AST 26 04/17/2019   ALKPHOS 67 03/16/2018   BILITOT 1.1 04/17/2019   Lab Results  Component Value Date   HGBA1C 4.8 05/01/2019   HGBA1C 4.8 04/30/2019   No results found for: INSULIN Lab Results  Component Value Date   TSH 2.14 04/17/2019   Lab Results  Component Value Date   CHOL 168 04/17/2019   HDL 71 04/17/2019   LDLCALC 82 04/17/2019   TRIG 74 04/17/2019   CHOLHDL 2.4 04/17/2019   Lab Results  Component Value Date   WBC 4.0 04/17/2019   HGB 11.7 04/17/2019   HCT 36.9 04/17/2019   MCV 90.4 04/17/2019   PLT 324 04/17/2019   Lab Results  Component Value Date   IRON 113 04/17/2019   TIBC 417 04/17/2019   FERRITIN 13 (L) 04/17/2019   Attestation Statements:   Reviewed by clinician on day of visit: allergies, medications, problem list, medical history, surgical history, family history, social history, and previous encounter notes.   I, Jodine Muchmore, am acting as transcriptionist for Burt Knack, MD.  I have reviewed the above documentation for accuracy and completeness, and I agree with the above. - Reuben Likes, MD

## 2019-10-11 ENCOUNTER — Other Ambulatory Visit (INDEPENDENT_AMBULATORY_CARE_PROVIDER_SITE_OTHER): Payer: Self-pay | Admitting: Family Medicine

## 2019-10-11 LAB — CBC AND DIFFERENTIAL: WBC: 3.2

## 2019-10-11 LAB — CBC: RBC: 3.7 — AB (ref 3.87–5.11)

## 2019-10-11 LAB — IRON,TIBC AND FERRITIN PANEL
%SAT: 16
Ferritin: 5
Iron: 63
TIBC: 400

## 2019-10-14 LAB — RETICULOCYTES
ABS Retic: 33390 cells/uL (ref 20000–8000)
Retic Ct Pct: 0.9 %

## 2019-10-14 LAB — CBC
HCT: 32.9 % — ABNORMAL LOW (ref 35.0–45.0)
Hemoglobin: 10 g/dL — ABNORMAL LOW (ref 11.7–15.5)
MCH: 27 pg (ref 27.0–33.0)
MCHC: 30.4 g/dL — ABNORMAL LOW (ref 32.0–36.0)
MCV: 88.7 fL (ref 80.0–100.0)
MPV: 12 fL (ref 7.5–12.5)
Platelets: 256 10*3/uL (ref 140–400)
RBC: 3.71 10*6/uL — ABNORMAL LOW (ref 3.80–5.10)
RDW: 14.1 % (ref 11.0–15.0)
WBC: 3.2 10*3/uL — ABNORMAL LOW (ref 3.8–10.8)

## 2019-10-14 LAB — VITAMIN D 1,25 DIHYDROXY
Vitamin D 1, 25 (OH)2 Total: 53 pg/mL (ref 18–72)
Vitamin D2 1, 25 (OH)2: 14 pg/mL
Vitamin D3 1, 25 (OH)2: 39 pg/mL

## 2019-10-14 LAB — IRON,TIBC AND FERRITIN PANEL
%SAT: 16 % (calc) (ref 16–45)
Ferritin: 5 ng/mL — ABNORMAL LOW (ref 16–154)
Iron: 63 ug/dL (ref 40–190)
TIBC: 400 mcg/dL (calc) (ref 250–450)

## 2019-10-14 LAB — B12 AND FOLATE PANEL
Folate: >24 ng/mL
Vitamin B-12: 979 pg/mL (ref 200–1100)

## 2019-10-16 ENCOUNTER — Encounter (INDEPENDENT_AMBULATORY_CARE_PROVIDER_SITE_OTHER): Payer: Self-pay | Admitting: Family Medicine

## 2019-10-16 ENCOUNTER — Ambulatory Visit (INDEPENDENT_AMBULATORY_CARE_PROVIDER_SITE_OTHER): Payer: No Typology Code available for payment source | Admitting: Family Medicine

## 2019-10-16 ENCOUNTER — Other Ambulatory Visit: Payer: Self-pay

## 2019-10-16 VITALS — BP 101/67 | HR 74 | Temp 98.5°F | Ht 67.0 in | Wt 211.0 lb

## 2019-10-16 DIAGNOSIS — Z9189 Other specified personal risk factors, not elsewhere classified: Secondary | ICD-10-CM

## 2019-10-16 DIAGNOSIS — E559 Vitamin D deficiency, unspecified: Secondary | ICD-10-CM

## 2019-10-16 DIAGNOSIS — F3289 Other specified depressive episodes: Secondary | ICD-10-CM | POA: Diagnosis not present

## 2019-10-16 DIAGNOSIS — E669 Obesity, unspecified: Secondary | ICD-10-CM

## 2019-10-16 DIAGNOSIS — D508 Other iron deficiency anemias: Secondary | ICD-10-CM | POA: Diagnosis not present

## 2019-10-16 DIAGNOSIS — Z6833 Body mass index (BMI) 33.0-33.9, adult: Secondary | ICD-10-CM

## 2019-10-16 MED ORDER — VITAMIN D (ERGOCALCIFEROL) 1.25 MG (50000 UNIT) PO CAPS: 50000.0000 [IU] | ORAL_CAPSULE | ORAL | 0 refills | Status: DC

## 2019-10-16 MED ORDER — WEGOVY 0.25 MG/0.5ML ~~LOC~~ SOAJ: 0.2500 mg | SUBCUTANEOUS | 0 refills | Status: DC

## 2019-10-17 NOTE — Progress Notes (Signed)
Chief Complaint:   OBESITY Herma is here to discuss her progress with her obesity treatment plan along with follow-up of her obesity related diagnoses. Mack is on following a lower carbohydrate, vegetable and lean protein rich diet plan and states she is following her eating plan approximately 66% of the time. Tanique states she is exercising with a personal trainer 1 time per week.  Today's visit was #: 8 Starting weight: 209 lbs Starting date: 04/30/2019 Today's weight: 211 lbs Today's date: 10/16/2019 Total lbs lost to date: 0 Total lbs lost since last in-office visit: 4  Interim History: Raylei voices she joined [employer] with her wife, but she wasn't really following that plan. She hasn't really been able to follow the Low carb either. She has also realized COVID is really stressing her and family. She has been likely overeating.  Subjective:   1. Vitamin D deficiency Alasha denies nausea, vomiting, or muscle weakness, but she notes fatigue. She is on prescription Vit D, and her last Vit D level was 18.  2. Other iron deficiency anemia Kadie's last iron level was 63, TiBC 400, and sat 16. She is compliant with taking multivitamins.  3. Other depression, with emotional eating Navpreet's symptoms have worsened recently with increased stress. She is not on medications.  4. At risk for osteoporosis Keaton is at higher risk of osteopenia and osteoporosis due to Vitamin D deficiency.   Assessment/Plan:   1. Vitamin D deficiency Low Vitamin D level contributes to fatigue and are associated with obesity, breast, and colon cancer. We will refill prescription Vitamin D for 1 month. Tae will follow-up for routine testing of Vitamin D, at least 2-3 times per year to avoid over-replacement.  - Vitamin D, Ergocalciferol, (DRISDOL) 1.25 MG (50000 UNIT) CAPS capsule; Take 1 capsule (50,000 Units total) by mouth once a week.  Dispense: 4 capsule; Refill: 0  2. Other iron deficiency anemia Jadaya will  continue her bariatric multivitamins. Orders and follow up as documented in patient record.  Counseling . Iron is essential for our bodies to make red blood cells.  Reasons that someone may be deficient include: an iron-deficient diet (more likely in those following vegan or vegetarian diets), women with heavy menses, patients with GI disorders or poor absorption, patients that have had bariatric surgery, frequent blood donors, patients with cancer, and patients with heart disease.   [name] foods include dark leafy greens, red and white meats, eggs, seafood, and beans.   . Certain foods and drinks prevent your body from absorbing iron properly. Avoid eating these foods in the same meal as iron-rich foods or with iron supplements. These foods include: coffee, black tea, and red wine; milk, dairy products, and foods that are high in calcium; beans and soybeans; whole grains.  . Constipation can be a side effect of iron supplementation. Increased water and fiber intake are helpful. Water goal: > 2 liters/day. Fiber goal: > 25 grams/day.  3. Other depression, with emotional eating Behavior modification techniques were discussed today to help Sanjuanita deal with her emotional/non-hunger eating behaviors. We will refer to Dr. Dewaine Conger, our Bariatric Psychologist for evaluation. Orders and follow up as documented in patient record.   4. At risk for osteoporosis Carlyne was given approximately 15 minutes of osteoporosis prevention counseling today. Bethzy is at risk for osteopenia and osteoporosis due to her Vitamin D deficiency. She was encouraged to take her Vitamin D and follow her higher calcium diet and increase strengthening exercise to  help strengthen her bones and decrease her risk of osteopenia and osteoporosis.  Repetitive spaced learning was employed today to elicit superior memory formation and behavioral change.  5. Class 1 obesity with serious comorbidity and body mass index (BMI) of 33.0 to 33.9 in  adult, unspecified obesity type Clarrissa is currently in the action stage of change. As such, her goal is to continue with weight loss efforts. She has agreed to the Vegetarian Plan.   We discussed various medication options to help [facility] with her weight loss efforts and we both agreed to start Wegovy 0.25 mg SubQ weekly with no refills.  - Semaglutide-Weight Management (WEGOVY) 0.25 MG/0.5ML SOAJ; Inject 0.25 mg into the skin once a week.  Dispense: 2 mL; Refill: 0  Exercise goals: All adults should avoid inactivity. Some physical activity is better than none, and adults who participate in any amount of physical activity gain some health benefits.  Behavioral modification strategies: increasing lean protein intake, increasing vegetables, increasing water intake, meal planning and cooking strategies and planning for success.  Rakeya has agreed to follow-up with our clinic in 2 weeks. She was informed of the importance of frequent follow-up visits to maximize her success with intensive lifestyle modifications for her multiple health conditions.   Objective:   Blood pressure 101/67, pulse 74, temperature 98.5 F (36.9 C), temperature source Oral, height 5\' 7"  (1.702 m), weight 211 lb (95.7 kg), last menstrual period 09/23/2019, SpO2 99 %. Body mass index is 33.05 kg/m.  General: Cooperative, alert, well developed, in no acute distress. HEENT: Conjunctivae and lids unremarkable. Cardiovascular: Regular rhythm.  Lungs: Normal work of breathing. Neurologic: No focal deficits.   Lab Results  Component Value Date   CREATININE 0.71 04/17/2019   BUN 17 04/17/2019   NA 140 04/17/2019   K 4.3 04/17/2019   CL 105 04/17/2019   CO2 24 04/17/2019   Lab Results  Component Value Date   ALT 38 (H) 04/17/2019   AST 26 04/17/2019   ALKPHOS 67 03/16/2018   BILITOT 1.1 04/17/2019   Lab Results  Component Value Date   HGBA1C 4.8 05/01/2019   HGBA1C 4.8 04/30/2019   No results found for: INSULIN Lab  Results  Component Value Date   TSH 2.14 04/17/2019   Lab Results  Component Value Date   CHOL 168 04/17/2019   HDL 71 04/17/2019   LDLCALC 82 04/17/2019   TRIG 74 04/17/2019   CHOLHDL 2.4 04/17/2019   Lab Results  Component Value Date   WBC 3.2 (L) 10/11/2019   HGB 10.0 (L) 10/11/2019   HCT 32.9 (L) 10/11/2019   MCV 88.7 10/11/2019   PLT 256 10/11/2019   Lab Results  Component Value Date   IRON 63 10/11/2019   TIBC 400 10/11/2019   FERRITIN 5 (L) 10/11/2019   Attestation Statements:   Reviewed by clinician on day of visit: allergies, medications, problem list, medical history, surgical history, family history, social history, and previous encounter notes.   I, Mekaela Azizi, am acting as transcriptionist for Burt Knack, MD.  I have reviewed the above documentation for accuracy and completeness, and I agree with the above. - Reuben Likes, MD

## 2019-10-23 ENCOUNTER — Other Ambulatory Visit (INDEPENDENT_AMBULATORY_CARE_PROVIDER_SITE_OTHER): Payer: Self-pay
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
## 2019-10-24 ENCOUNTER — Encounter (INDEPENDENT_AMBULATORY_CARE_PROVIDER_SITE_OTHER): Payer: Self-pay | Admitting: Family Medicine

## 2019-10-24 DIAGNOSIS — E669 Obesity, unspecified: Secondary | ICD-10-CM

## 2019-10-24 DIAGNOSIS — Z6833 Body mass index (BMI) 33.0-33.9, adult: Secondary | ICD-10-CM

## 2019-10-24 NOTE — Telephone Encounter (Signed)
Please advise 

## 2019-10-25 ENCOUNTER — Other Ambulatory Visit (INDEPENDENT_AMBULATORY_CARE_PROVIDER_SITE_OTHER): Payer: Self-pay | Admitting: Family Medicine

## 2019-10-25 DIAGNOSIS — Z6833 Body mass index (BMI) 33.0-33.9, adult: Secondary | ICD-10-CM

## 2019-10-25 DIAGNOSIS — E669 Obesity, unspecified: Secondary | ICD-10-CM

## 2019-10-25 MED ORDER — SAXENDA 18 MG/3ML ~~LOC~~ SOPN: 0.6000 mg | PEN_INJECTOR | Freq: Every day | SUBCUTANEOUS | 0 refills | Status: DC

## 2019-10-25 NOTE — Telephone Encounter (Signed)
Please review

## 2019-10-29 NOTE — Telephone Encounter (Signed)
Please review

## 2019-10-31 ENCOUNTER — Other Ambulatory Visit (INDEPENDENT_AMBULATORY_CARE_PROVIDER_SITE_OTHER): Payer: Self-pay

## 2019-11-01 ENCOUNTER — Other Ambulatory Visit (INDEPENDENT_AMBULATORY_CARE_PROVIDER_SITE_OTHER): Payer: Self-pay

## 2019-11-01 DIAGNOSIS — E669 Obesity, unspecified: Secondary | ICD-10-CM

## 2019-11-01 DIAGNOSIS — Z6833 Body mass index (BMI) 33.0-33.9, adult: Secondary | ICD-10-CM

## 2019-11-01 MED ORDER — WEGOVY 0.25 MG/0.5ML ~~LOC~~ SOAJ: 0.2500 mg | SUBCUTANEOUS | 0 refills | Status: DC

## 2019-11-01 NOTE — Telephone Encounter (Signed)
Please advise 

## 2019-11-06 ENCOUNTER — Encounter (INDEPENDENT_AMBULATORY_CARE_PROVIDER_SITE_OTHER): Payer: Self-pay | Admitting: Family Medicine

## 2019-11-06 ENCOUNTER — Ambulatory Visit (INDEPENDENT_AMBULATORY_CARE_PROVIDER_SITE_OTHER): Payer: No Typology Code available for payment source | Admitting: Family Medicine

## 2019-11-06 ENCOUNTER — Ambulatory Visit (INDEPENDENT_AMBULATORY_CARE_PROVIDER_SITE_OTHER): Payer: No Typology Code available for payment source | Admitting: Psychology

## 2019-11-06 ENCOUNTER — Other Ambulatory Visit: Payer: Self-pay

## 2019-11-06 VITALS — BP 111/70 | HR 65 | Temp 98.2°F | Ht 67.0 in | Wt 214.0 lb

## 2019-11-06 DIAGNOSIS — Z6833 Body mass index (BMI) 33.0-33.9, adult: Secondary | ICD-10-CM

## 2019-11-06 DIAGNOSIS — F419 Anxiety disorder, unspecified: Secondary | ICD-10-CM | POA: Diagnosis not present

## 2019-11-06 DIAGNOSIS — R7401 Elevation of levels of liver transaminase levels: Secondary | ICD-10-CM

## 2019-11-06 DIAGNOSIS — F3289 Other specified depressive episodes: Secondary | ICD-10-CM | POA: Diagnosis not present

## 2019-11-06 DIAGNOSIS — E669 Obesity, unspecified: Secondary | ICD-10-CM

## 2019-11-06 DIAGNOSIS — E559 Vitamin D deficiency, unspecified: Secondary | ICD-10-CM

## 2019-11-07 NOTE — Progress Notes (Signed)
Chief Complaint:   OBESITY Leah Gilmore is here to discuss her progress with her obesity treatment plan along with follow-up of her obesity related diagnoses. Leah Gilmore is on the Vegetarian Plan and states she is following her eating plan approximately 30% of the time. Leah Gilmore states she is doing 0 minutes 0 times per week.  Today's visit was #: 9 Starting weight: 209 lbs Starting date: 04/30/2019 Today's weight: 214 lbs Today's date: 11/06/2019 Total lbs lost to date: 0 Total lbs lost since last in-office visit: 0  Interim History: Leah Gilmore voices she started the [place] 5 days ago, and she denies GI side effects since starting Wegovy. She has been doing portion control and making healthier choices. She doesn't think she can recommit to the Vegetarian plan. She has no plans for the upcoming few weeks.  Subjective:   1. Vitamin D deficiency Leah Gilmore denies nausea, vomiting, or muscle weakness, but she notes fatigue. She is on prescription Vit D.  2. Transaminitis Leah Gilmore's last ALT was 38, but previously within normal limits, and AST 26.  Assessment/Plan:   1. Vitamin D deficiency Low Vitamin D level contributes to fatigue and are associated with obesity, breast, and colon cancer. Leah Gilmore agreed to continue taking prescription Vitamin D 50,000 IU every week, and will change to every 14 days at next refill. She and will follow-up for routine testing of Vitamin D, at least 2-3 times per year to avoid over-replacement.  2. Transaminitis We will repeat labs in 2 months. Leah Gilmore will continue to follow up as directed.  3. Class 1 obesity with serious comorbidity and body mass index (BMI) of 33.0 to 33.9 in adult, unspecified obesity type Leah Gilmore is currently in the action stage of change. As such, her goal is to continue with weight loss efforts. She has agreed to following a lower carbohydrate, vegetable and lean protein rich diet plan.   We discussed various medication options to help Leah Gilmore with her weight loss efforts and we  both agreed to continue Wegovy 0.25 mg no change in dose.  Exercise goals: All adults should avoid inactivity. Some physical activity is better than none, and adults who participate in any amount of physical activity gain some health benefits.  Behavioral modification strategies: increasing lean protein intake, increasing vegetables, meal planning and cooking strategies, keeping healthy foods in the home and planning for success.  Leah Gilmore has agreed to follow-up with our clinic in 2 weeks. She was informed of the importance of frequent follow-up visits to maximize her success with intensive lifestyle modifications for her multiple health conditions.   Objective:   Blood pressure 111/70, pulse 65, temperature 98.2 F (36.8 C), temperature source Oral, height 5\' 7"  (1.702 m), weight 214 lb (97.1 kg), last menstrual period 10/22/2019, SpO2 100 %. Body mass index is 33.52 kg/m.  General: Cooperative, alert, well developed, in no acute distress. HEENT: Conjunctivae and lids unremarkable. Cardiovascular: Regular rhythm.  Lungs: Normal work of breathing. Neurologic: No focal deficits.   Lab Results  Component Value Date   CREATININE 0.71 04/17/2019   BUN 17 04/17/2019   NA 140 04/17/2019   K 4.3 04/17/2019   CL 105 04/17/2019   CO2 24 04/17/2019   Lab Results  Component Value Date   ALT 38 (H) 04/17/2019   AST 26 04/17/2019   ALKPHOS 67 03/16/2018   BILITOT 1.1 04/17/2019   Lab Results  Component Value Date   HGBA1C 4.8 05/01/2019   HGBA1C 4.8 04/30/2019   No results found  for: INSULIN Lab Results  Component Value Date   TSH 2.14 04/17/2019   Lab Results  Component Value Date   CHOL 168 04/17/2019   HDL 71 04/17/2019   LDLCALC 82 04/17/2019   TRIG 74 04/17/2019   CHOLHDL 2.4 04/17/2019   Lab Results  Component Value Date   WBC 3.2 (L) 10/11/2019   HGB 10.0 (L) 10/11/2019   HCT 32.9 (L) 10/11/2019   MCV 88.7 10/11/2019   PLT 256 10/11/2019   Lab Results  Component  Value Date   IRON 63 10/11/2019   TIBC 400 10/11/2019   FERRITIN 5 (L) 10/11/2019   Attestation Statements:   Reviewed by clinician on day of visit: allergies, medications, problem list, medical history, surgical history, family history, social history, and previous encounter notes.  Time spent on visit including pre-visit chart review and post-visit care and charting was 16 minutes.    I, Leah Gilmore, am acting as transcriptionist for Leah Likes, MD.  I have reviewed the above documentation for accuracy and completeness, and I agree with the above. - Leah Mires, MD

## 2019-11-13 NOTE — Progress Notes (Signed)
Office: [phone]  /  Fax: [phone]    Date: November 22, 2019   Appointment Start Time: 10:33am Duration: 27 minutes Provider: Lawerance Cruel, Psy.D. Type of Session: Individual Therapy  Location of Patient: Home Location of Provider: Provider's Home Type of Contact: Telepsychological Visit via MyChart Video Visit   Informed Consent: This provider called Leah Gilmore at 10:32am as she did not present for the telepsychological appointment. She noted she forgot, but would join. As such, today's appointment was initiated 3 minutes late. Prior to initiating telepsychological services, Leah Gilmore completed an informed consent document, which included the development of a safety plan (e.g., an emergency contact and nearest emergency room) in the event of an emergency/crisis. Leah Gilmore expressed understanding of the rationale of the safety plan. Leah Gilmore verbally acknowledged understanding she is ultimately responsible for understanding her insurance benefits for telepsychological and in-person services. This provider also reviewed confidentiality, as it relates to telepsychological services, as well as the rationale for telepsychological services (i.e., to reduce exposure risk to COVID-19). Leah Gilmore  acknowledged understanding that appointments cannot be recorded without both party consent and she is aware she is responsible for securing confidentiality on her end of the session. Leah Gilmore verbally consented to proceed.  Session Content: Leah Gilmore is a 34 y.o. female presenting for a follow-up appointment to address the previously established treatment goal of increasing coping skills. Today's appointment was a telepsychological visit due to COVID-19. Leah Gilmore provided verbal consent for today's telepsychological appointment and she is aware she is responsible for securing confidentiality on her end of the session. Prior to proceeding with today's appointment, Leah Gilmore's physical location at the time of this appointment was obtained as well a phone  number she could be reached at in the event of technical difficulties. Leah Gilmore and this provider participated in today's telepsychological service.   This provider conducted a brief check-in. Leah Gilmore reported an increase in physical activity, noting she is working with a Psychologist, educational. She reported feeling anxious and experiencing physical symptoms (e.g., nausea) due to "fear of failing" any time she is scheduled to meet with her trainer. As such, she was introduced to a grounding technique involving her senses to assist with coping. She was led through an exercise and her experience was processed. Leah Gilmore provided verbal consent during today's appointment for this provider to send the handout for today's exercise via e-mail. Moreover, Leah Gilmore indicated she is experiencing a decreased appetite since starting Wegovy. She was encouraged to speak with Dr. Lawson Radar further; she agreed. Additionally, it was recommended she try to eat smaller portions frequently to ensure she is consuming protein. Furthermore, emotional and physical hunger were reviewed. Overall, Leah Gilmore was receptive to today's appointment as evidenced by openness to sharing, responsiveness to feedback, and willingness to implement discussed strategies .  Mental Status Examination:  Appearance: well groomed and appropriate hygiene  Behavior: appropriate to circumstances Mood: euthymic Affect: mood congruent Speech: normal in rate, volume, and tone Eye Contact: appropriate Psychomotor Activity: appropriate Gait: unable to assess Thought Process: linear, logical, and goal directed  Thought Content/Perception: no hallucinations, delusions, bizarre thinking or behavior reported or observed and no evidence of suicidal and homicidal ideation, plan, and intent Orientation: time, person, place, and purpose of appointment Memory/Concentration: memory, attention, language, and fund of knowledge intact  Insight/Judgment: good   Interventions:  Conducted a brief chart  review Provided empathic reflections and validation Reviewed content from the previous session Employed supportive psychotherapy interventions to facilitate reduced distress and to improve coping skills with identified stressors Psychoeducation provided regarding  grounding techniques Engaged patient in a grounding technique  DSM-5 Diagnosis(es): 311 (F32.8) Other Specified Depressive Disorder, Emotional Eating Behaviors and 300.00 (F41.9) Unspecified Anxiety Disorder  Treatment Goal & Progress: During the initial appointment with this provider, the following treatment goal was established: increase coping skills. [name] has demonstrated progress in her goal as evidenced by increased awareness of hunger patterns.   Plan: The next appointment will be scheduled in two weeks, which will be via MyChart Video Visit. The next session will focus on working towards the established treatment goal.

## 2019-11-20 ENCOUNTER — Other Ambulatory Visit: Payer: Self-pay

## 2019-11-20 ENCOUNTER — Ambulatory Visit (INDEPENDENT_AMBULATORY_CARE_PROVIDER_SITE_OTHER): Payer: No Typology Code available for payment source | Admitting: Family Medicine

## 2019-11-20 ENCOUNTER — Encounter (INDEPENDENT_AMBULATORY_CARE_PROVIDER_SITE_OTHER): Payer: Self-pay | Admitting: Family Medicine

## 2019-11-20 VITALS — BP 111/75 | HR 67 | Temp 98.8°F | Ht 67.0 in | Wt 210.0 lb

## 2019-11-20 DIAGNOSIS — E669 Obesity, unspecified: Secondary | ICD-10-CM | POA: Diagnosis not present

## 2019-11-20 DIAGNOSIS — Z9189 Other specified personal risk factors, not elsewhere classified: Secondary | ICD-10-CM | POA: Diagnosis not present

## 2019-11-20 DIAGNOSIS — Z6833 Body mass index (BMI) 33.0-33.9, adult: Secondary | ICD-10-CM

## 2019-11-20 DIAGNOSIS — E559 Vitamin D deficiency, unspecified: Secondary | ICD-10-CM

## 2019-11-20 DIAGNOSIS — D508 Other iron deficiency anemias: Secondary | ICD-10-CM | POA: Diagnosis not present

## 2019-11-20 MED ORDER — WEGOVY 0.5 MG/0.5ML ~~LOC~~ SOAJ: 0.5000 mg | SUBCUTANEOUS | 0 refills | Status: DC

## 2019-11-21 NOTE — Progress Notes (Signed)
Chief Complaint:   OBESITY Leah Gilmore is here to discuss her progress with her obesity treatment plan along with follow-up of her obesity related diagnoses. Leah Gilmore is on following a lower carbohydrate, vegetable and lean protein rich diet plan and states she is following her eating plan approximately 0% of the time. Leah Gilmore states she is exercising with a trainer for 30 minutes 3 times per week.  Today's visit was #: 10 Starting weight: 209 lbs Starting date: 04/30/2019 Today's weight: 210 lbs Today's date: 11/20/2019 Total lbs lost to date: 0 Total lbs lost since last in-office visit: 4  Interim History: Leah Gilmore is still doing well on Wegovy, and she voices it suppresses her appetite. She is not monitoring food intake because she is doing low carbs. She is having a Premier shake with coffee, egg and chicken sausage then chicken wrap for lunch. She has been eating containers or meal prep. She is now feeling the restriction she did previously.  Subjective:   1. Vitamin D deficiency Leah Gilmore denies nausea, vomiting, or muscle weakness, but she notes fatigue on prescription Vit D. Last Vit D level was not the same as previous labs draws.  2. 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 She is on multivitamins with iron daily. Last hemoglobin and hematocrit was still low.  3. At risk for side effect of medication Leah Gilmore is at risk for drug side effects with increase of Wegovy. Side effects of nausea and abdominal pain.  Assessment/Plan:   1. Vitamin D deficiency Low Vitamin D level contributes to fatigue and are associated with obesity, breast, and colon cancer. Leah Gilmore agreed to continue taking prescription Vitamin D 50,000 IU every week, no refill needed. She will follow-up for routine testing of Vitamin D, at least 2-3 times per year to avoid over-replacement.  2. Other iron deficiency anemia We will follow up on CBC at her next blood draw. Leah Gilmore was encouraged to continue her  multivitamins daily. Orders and follow up as documented in patient record.  Counseling . Iron is essential for our bodies to make red blood cells.  Reasons that someone may be deficient include: an iron-deficient diet (more likely in those following vegan or vegetarian diets), women with heavy menses, patients with GI disorders or poor absorption, patients that have had bariatric surgery, frequent blood donors, patients with cancer, and patients with heart disease.   Leah Gilmore foods include dark leafy greens, red and white meats, eggs, seafood, and beans.   . Certain foods and drinks prevent your body from absorbing iron properly. Avoid eating these foods in the same meal as iron-rich foods or with iron supplements. These foods include: coffee, black tea, and red wine; milk, dairy products, and foods that are high in calcium; beans and soybeans; whole grains.  . Constipation can be a side effect of iron supplementation. Increased water and fiber intake are helpful. Water goal: > 2 liters/day. Fiber goal: > 25 grams/day.  3. At risk for side effect of medication Leah Gilmore was given approximately 15 minutes of drug side effect counseling today.  We discussed side effect possibility and risk versus benefits. Leah Gilmore agreed to the medication and will contact this office if these side effects are intolerable.  Repetitive spaced learning was employed today to elicit superior memory formation and behavioral change.  4. Class 1 obesity with serious comorbidity and body mass index (BMI) of 33.0 to 33.9 in adult, unspecified obesity type Leah Gilmore is currently in the action stage  of change. As such, her goal is to continue with weight loss efforts. She has agreed to following a lower carbohydrate, vegetable and lean protein rich diet plan.   We discussed various medication options to help Leah Gilmore with her weight loss efforts and we both agreed to increase Wegovy to 0.50 mg SubQ weekly with no refills.  - Semaglutide-Weight  Management (WEGOVY) 0.5 MG/0.5ML SOAJ; Inject 0.5 mg into the skin once a week.  Dispense: 2 mL; Refill: 0  Exercise goals: All adults should avoid inactivity. Some physical activity is better than none, and adults who participate in any amount of physical activity gain some health benefits.  Behavioral modification strategies: increasing lean protein intake, meal planning and cooking strategies, keeping healthy foods in the home and planning for success.  Leah Gilmore has agreed to follow-up with our clinic in 2 weeks. She was informed of the importance of frequent follow-up visits to maximize her success with intensive lifestyle modifications for her multiple health conditions.   Objective:   Blood pressure 111/75, pulse 67, temperature 98.8 F (37.1 C), temperature source Oral, height 5\' 7"  (1.702 m), weight 210 lb (95.3 kg), last menstrual period 10/22/2019, SpO2 100 %. Body mass index is 32.89 kg/m.  General: Cooperative, alert, well developed, in no acute distress. HEENT: Conjunctivae and lids unremarkable. Cardiovascular: Regular rhythm.  Lungs: Normal work of breathing. Neurologic: No focal deficits.   Lab Results  Component Value Date   CREATININE 0.71 04/17/2019   BUN 17 04/17/2019   NA 140 04/17/2019   K 4.3 04/17/2019   CL 105 04/17/2019   CO2 24 04/17/2019   Lab Results  Component Value Date   ALT 38 (H) 04/17/2019   AST 26 04/17/2019   ALKPHOS 67 03/16/2018   BILITOT 1.1 04/17/2019   Lab Results  Component Value Date   HGBA1C 4.8 05/01/2019   HGBA1C 4.8 04/30/2019   No results found for: INSULIN Lab Results  Component Value Date   TSH 2.14 04/17/2019   Lab Results  Component Value Date   CHOL 168 04/17/2019   HDL 71 04/17/2019   LDLCALC 82 04/17/2019   TRIG 74 04/17/2019   CHOLHDL 2.4 04/17/2019   Lab Results  Component Value Date   WBC 3.2 (L) 10/11/2019   HGB 10.0 (L) 10/11/2019   HCT 32.9 (L) 10/11/2019   MCV 88.7 10/11/2019   PLT 256 10/11/2019    Lab Results  Component Value Date   IRON 63 10/11/2019   TIBC 400 10/11/2019   FERRITIN 5 (L) 10/11/2019   Attestation Statements:   Reviewed by clinician on day of visit: allergies, medications, problem list, medical history, surgical history, family history, social history, and previous encounter notes.   I, Leah Gilmore, am acting as transcriptionist for Burt Knack, MD.  I have reviewed the above documentation for accuracy and completeness, and I agree with the above. - Leah Likes, MD

## 2019-11-22 ENCOUNTER — Telehealth (INDEPENDENT_AMBULATORY_CARE_PROVIDER_SITE_OTHER): Payer: No Typology Code available for payment source | Admitting: Psychology

## 2019-11-22 DIAGNOSIS — F419 Anxiety disorder, unspecified: Secondary | ICD-10-CM

## 2019-11-22 DIAGNOSIS — F3289 Other specified depressive episodes: Secondary | ICD-10-CM

## 2019-11-22 NOTE — Progress Notes (Signed)
Office: [phone]  /  Fax: [phone]    Date: December 06, 2019   Appointment Start Time: 10:27am Duration: 31 minutes Provider: Lawerance Cruel, Psy.D. Type of Session: Individual Therapy  Location of Patient: Work Location of Provider: Provider's Home Type of Contact: Telepsychological Visit via MyChart Video Visit  Session Content: Leah Gilmore is a 34 y.o. female presenting for a follow-up appointment to address the previously established treatment goal of increasing coping skills. Today's appointment was a telepsychological visit due to COVID-19. Sukari provided verbal consent for today's telepsychological appointment and she is aware she is responsible for securing confidentiality on her end of the session. Prior to proceeding with today's appointment, Laurisa's physical location at the time of this appointment was obtained as well a phone number she could be reached at in the event of technical difficulties. Shauntea and this provider participated in today's telepsychological service.   This provider conducted a brief check-in. Mabry reported she has been sick, noting her COVID-19 test was negative. She stated the aforementioned has impacted her appetite, which she discussed during her appointment yesterday with Dr. Lawson Radar. She reported she has "attempted to" engage in the shared grounding exercise. Abigayle believes having someone walk her through it can be helpful versus walking herself through the exercise. YouTube was recommended. Psychoeducation regarding triggers for emotional eating was provided. Marya was provided a handout, and encouraged to utilize the handout between now and the next appointment to increase awareness of triggers and frequency. Teriah agreed. This provider also discussed behavioral strategies for specific triggers, such as placing the utensil down when conversing to avoid mindless eating. Myalynn provided verbal consent during today's appointment for this provider to send a handout about triggers  via e-mail. Notably, this provider discussed her upcoming maternity leave toward the end of November. Cailynn acknowledged understanding given the uncertain nature of the circumstances, this provider may be out of the office sooner. This provider and Glynis Smiles discussed referral options and verbal consent was provided for this provider to send a list of referral options via e-mail. All questions/concerns were addressed. Cloteal denied any concerns. Overall, Orit was receptive to today's appointment as evidenced by openness to sharing, responsiveness to feedback, and willingness to explore triggers for emotional eating.  Mental Status Examination:  Appearance: well groomed and appropriate hygiene  Behavior: appropriate to circumstances Mood: euthymic Affect: mood congruent Speech: normal in rate, volume, and tone Eye Contact: appropriate Psychomotor Activity: appropriate Gait: unable to assess Thought Process: linear, logical, and goal directed  Thought Content/Perception: no hallucinations, delusions, bizarre thinking or behavior reported or observed and no evidence of suicidal and homicidal ideation, plan, and intent Orientation: time, person, place, and purpose of appointment Memory/Concentration: memory, attention, language, and fund of knowledge intact  Insight/Judgment: good  Interventions:  Conducted a brief chart review Provided empathic reflections and validation Reviewed content from the previous session Employed supportive psychotherapy interventions to facilitate reduced distress and to improve coping skills with identified stressors Psychoeducation provided regarding triggers for emotional eating  DSM-5 Diagnosis(es): 311 (F32.8) Other Specified Depressive Disorder, Emotional Eating Behaviors and 300.00 (F41.9) Unspecified Anxiety Disorder  Treatment Goal & Progress: During the initial appointment with this provider, the following treatment goal was established: increase coping skills. Tynlee has  demonstrated progress in her goal as evidenced by increased awareness of hunger patterns. Kaleia also continues to demonstrate willingness to engage in learned skill(s).  Plan: The next appointment will be scheduled in two weeks, which will be via MyChart Video Visit. The next  session will focus on working towards the established treatment goal.

## 2019-12-03 ENCOUNTER — Other Ambulatory Visit: Payer: No Typology Code available for payment source

## 2019-12-03 DIAGNOSIS — Z20822 Contact with and (suspected) exposure to covid-19: Secondary | ICD-10-CM

## 2019-12-04 ENCOUNTER — Encounter (INDEPENDENT_AMBULATORY_CARE_PROVIDER_SITE_OTHER): Payer: Self-pay | Admitting: Family Medicine

## 2019-12-05 ENCOUNTER — Other Ambulatory Visit: Payer: Self-pay

## 2019-12-05 ENCOUNTER — Telehealth (INDEPENDENT_AMBULATORY_CARE_PROVIDER_SITE_OTHER): Payer: No Typology Code available for payment source | Admitting: Family Medicine

## 2019-12-05 ENCOUNTER — Encounter (INDEPENDENT_AMBULATORY_CARE_PROVIDER_SITE_OTHER): Payer: Self-pay | Admitting: Family Medicine

## 2019-12-05 DIAGNOSIS — E669 Obesity, unspecified: Secondary | ICD-10-CM

## 2019-12-05 DIAGNOSIS — Z6833 Body mass index (BMI) 33.0-33.9, adult: Secondary | ICD-10-CM

## 2019-12-05 DIAGNOSIS — D508 Other iron deficiency anemias: Secondary | ICD-10-CM

## 2019-12-05 DIAGNOSIS — E559 Vitamin D deficiency, unspecified: Secondary | ICD-10-CM | POA: Diagnosis not present

## 2019-12-05 LAB — SARS-COV-2, NAA 2 DAY TAT

## 2019-12-05 LAB — NOVEL CORONAVIRUS, NAA: SARS-CoV-2, NAA: NOT DETECTED

## 2019-12-05 MED ORDER — VITAMIN D (ERGOCALCIFEROL) 1.25 MG (50000 UNIT) PO CAPS: 50000.0000 [IU] | ORAL_CAPSULE | ORAL | 0 refills | Status: DC

## 2019-12-05 NOTE — Progress Notes (Signed)
TeleHealth Visit:  Due to the COVID-19 pandemic, this visit was completed with telemedicine (audio/video) technology to reduce patient and provider exposure as well as to preserve personal protective equipment.   Leah Gilmore has verbally consented to this TeleHealth visit. The patient is located at home, the provider is located at the [facility] office. The participants in this visit include the listed provider and patient. The visit was conducted today via MyChart video.   Chief Complaint: OBESITY Leah Gilmore is here to discuss her progress with her obesity treatment plan along with follow-up of her obesity related diagnoses. Leah Gilmore is on following a lower carbohydrate, vegetable and lean protein rich diet plan and states she is following her eating plan approximately 50% of the time. Leah Gilmore states she is exercising with a trainer for 30 minutes 2-3 times per week.  Today's visit was #: 11 Starting weight: 209 lbs Starting date: 04/30/2019  Interim History: Leah Gilmore had to get tested for COVID 2 days ago and she is still experiencing symptoms. She is having coughing, congestion, headache. Symptoms have started 4 days ago. She is eating on her lean protein and vegetable plan. Mots food tatses not appetizing to her. She denies hunger cues.  Subjective:   1. Vitamin D deficiency Leah Gilmore denies nausea, vomiting, or muscle weakness, but she notes fatigue. She is on prescription Vit D.  2. 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 She is on multivitamins daily. Last iron and mcv was within normal limits, and ferritin was low.  Assessment/Plan:   1. Vitamin D deficiency Low Vitamin D level contributes to fatigue and are associated with obesity, breast, and colon cancer. We will refill prescription Vitamin D for 1 month. Leah Gilmore will follow-up for routine testing of Vitamin D, at least 2-3 times per year to avoid over-replacement.  - Vitamin D, Ergocalciferol, (DRISDOL) 1.25 MG  (50000 UNIT) CAPS capsule; Take 1 capsule (50,000 Units total) by mouth once a week.  Dispense: 4 capsule; Refill: 0  2. Other iron deficiency anemia We will follow up on labs in 2 months. Orders and follow up as documented in patient record.  Counseling . Iron is essential for our bodies to make red blood cells.  Reasons that someone may be deficient include: an iron-deficient diet (more likely in those following vegan or vegetarian diets), women with heavy menses, patients with GI disorders or poor absorption, patients that have had bariatric surgery, frequent blood donors, patients with cancer, and patients with heart disease.   Leah Gilmore foods include dark leafy greens, red and white meats, eggs, seafood, and beans.   . Certain foods and drinks prevent your body from absorbing iron properly. Avoid eating these foods in the same meal as iron-rich foods or with iron supplements. These foods include: coffee, black tea, and red wine; milk, dairy products, and foods that are high in calcium; beans and soybeans; whole grains.  . Constipation can be a side effect of iron supplementation. Increased water and fiber intake are helpful. Water goal: > 2 liters/day. Fiber goal: > 25 grams/day.  3. Class 1 obesity with serious comorbidity and body mass index (BMI) of 33.0 to 33.9 in adult, unspecified obesity type Leah Gilmore is currently in the action stage of change. As such, her goal is to continue with weight loss efforts. She has agreed to following a lower carbohydrate, vegetable and lean protein rich diet plan.   We will follow up on her ability to get food in,  may consider decreasing dose of Wegovy at her next appointment.  Exercise goals: As is.  Behavioral modification strategies: increasing lean protein intake, no skipping meals, meal planning and cooking strategies, keeping healthy foods in the home and planning for success.  Leah Gilmore has agreed to follow-up with our clinic in 2 weeks. She was informed of  the importance of frequent follow-up visits to maximize her success with intensive lifestyle modifications for her multiple health conditions.  Objective:   VITALS: Per patient if applicable, see vitals. GENERAL: Alert and in no acute distress. CARDIOPULMONARY: No increased WOB. Speaking in clear sentences.  PSYCH: Pleasant and cooperative. Speech normal rate and rhythm. Affect is appropriate. Insight and judgement are appropriate. Attention is focused, linear, and appropriate.  NEURO: Oriented as arrived to appointment on time with no prompting.   Lab Results  Component Value Date   CREATININE 0.71 04/17/2019   BUN 17 04/17/2019   NA 140 04/17/2019   K 4.3 04/17/2019   CL 105 04/17/2019   CO2 24 04/17/2019   Lab Results  Component Value Date   ALT 38 (H) 04/17/2019   AST 26 04/17/2019   ALKPHOS 67 03/16/2018   BILITOT 1.1 04/17/2019   Lab Results  Component Value Date   HGBA1C 4.8 05/01/2019   HGBA1C 4.8 04/30/2019   No results found for: INSULIN Lab Results  Component Value Date   TSH 2.14 04/17/2019   Lab Results  Component Value Date   CHOL 168 04/17/2019   HDL 71 04/17/2019   LDLCALC 82 04/17/2019   TRIG 74 04/17/2019   CHOLHDL 2.4 04/17/2019   Lab Results  Component Value Date   WBC 3.2 (L) 10/11/2019   HGB 10.0 (L) 10/11/2019   HCT 32.9 (L) 10/11/2019   MCV 88.7 10/11/2019   PLT 256 10/11/2019   Lab Results  Component Value Date   IRON 63 10/11/2019   TIBC 400 10/11/2019   FERRITIN 5 (L) 10/11/2019    Attestation Statements:   Reviewed by clinician on day of visit: allergies, medications, problem list, medical history, surgical history, family history, social history, and previous encounter notes.  Time spent on visit including pre-visit chart review and post-visit charting and care was 18 minutes.    I, Jeanie Mccard, am acting as transcriptionist for Reuben Likes, MD.  I have reviewed the above documentation for accuracy and  completeness, and I agree with the above. - Katherina Mires, MD

## 2019-12-06 ENCOUNTER — Telehealth (INDEPENDENT_AMBULATORY_CARE_PROVIDER_SITE_OTHER): Payer: No Typology Code available for payment source | Admitting: Psychology

## 2019-12-06 DIAGNOSIS — F3289 Other specified depressive episodes: Secondary | ICD-10-CM | POA: Diagnosis not present

## 2019-12-06 DIAGNOSIS — F419 Anxiety disorder, unspecified: Secondary | ICD-10-CM | POA: Diagnosis not present

## 2019-12-06 NOTE — Progress Notes (Signed)
°  Office: [phone]  /  Fax: [phone]    Date: December 20, 2019   Appointment Start Time: 10:31am Duration: 26 minutes Provider: Lawerance Cruel, Psy.D. Type of Session: Individual Therapy  Location of Patient: Parked in car outside of [facility] Location of Provider: Provider's Home Type of Contact: Telepsychological Visit via MyChart Video Visit  Session Content: Leah Gilmore is a 34 y.o. female presenting for a follow-up appointment to address the previously established treatment goal of increasing coping skills. Today's appointment was a telepsychological visit due to COVID-19. Leah Gilmore provided verbal consent for today's telepsychological appointment and she is aware she is responsible for securing confidentiality on her end of the session. Prior to proceeding with today's appointment, Leah Gilmore's physical location at the time of this appointment was obtained as well a phone number she could be reached at in the event of technical difficulties. Leah Gilmore and this provider participated in today's telepsychological service.   This provider conducted a brief check-in. Leah Gilmore shared things have been going well, noting she is feeling better. She also shared she is prioritizing getting back to working out. Positive reinforcement was provided. Psychoeducation regarding mindfulness was provided. A handout was provided to [facility] with further information regarding mindfulness, including exercises. This provider also explained the benefits of mindfulness as it relates to emotional eating. During today's appointment, Leah Gilmore was led through a breathing space mindfulness exercise. Her experience was processed. This provider also discussed the utilization of YouTube for mindfulness exercises (e.g., exercises by Rhae Hammock). Leah Gilmore was encouraged to engage in the provided exercises between now and the next appointment with this provider. Leah Gilmore agreed. Furthermore, termination planning was discussed. Leah Gilmore was receptive to a  follow-up appointment in 3 weeks and an additional follow-up/termination appointment in approximately 3 weeks after that. Leah Gilmore was receptive to today's appointment as evidenced by openness to sharing, responsiveness to feedback, and willingness to engage in mindfulness exercises to assist with coping.  Mental Status Examination:  Appearance: well groomed and appropriate hygiene  Behavior: appropriate to circumstances Mood: euthymic Affect: mood congruent Speech: normal in rate, volume, and tone Eye Contact: appropriate Psychomotor Activity: appropriate Gait: unable to assess Thought Process: linear, logical, and goal directed  Thought Content/Perception: no hallucinations, delusions, bizarre thinking or behavior reported or observed and no evidence of suicidal and homicidal ideation, plan, and intent Orientation: time, person, place, and purpose of appointment Memory/Concentration: memory, attention, language, and fund of knowledge intact  Insight/Judgment: good  Interventions:  Conducted a brief chart review Provided empathic reflections and validation Provided positive reinforcement Employed supportive psychotherapy interventions to facilitate reduced distress and to improve coping skills with identified stressors Psychoeducation provided regarding mindfulness Engaged patient in mindfulness exercise(s) Employed acceptance and commitment interventions to emphasize mindfulness and acceptance without struggle Discussed termination planning  DSM-5 Diagnosis(es): 311 (F32.8) Other Specified Depressive Disorder, Emotional Eating Behaviors and 300.00 (F41.9) Unspecified Anxiety Disorder  Treatment Goal & Progress: During the initial appointment with this provider, the following treatment goal was established: increase self-compassion . Leah Gilmore has demonstrated progress in her goal as evidenced by increased awareness of hunger patterns and increased awareness of triggers for emotional eating. Leah Gilmore  also continues to demonstrate willingness to engage in learned skill(s).  Plan: The next appointment will be scheduled in three weeks, which will be via MyChart Video Visit. The next session will focus on working towards the established treatment goal.

## 2019-12-11 ENCOUNTER — Ambulatory Visit
Admission: RE | Admit: 2019-12-11 | Discharge: 2019-12-11 | Disposition: A | Discharge status: Home / Self Care | Payer: No Typology Code available for payment source | Source: Ambulatory Visit | Attending: Emergency Medicine | Admitting: Emergency Medicine

## 2019-12-11 ENCOUNTER — Other Ambulatory Visit: Payer: Self-pay

## 2019-12-11 VITALS — BP 115/77 | HR 58 | Temp 98.5°F | Resp 16

## 2019-12-11 DIAGNOSIS — Z20822 Contact with and (suspected) exposure to covid-19: Secondary | ICD-10-CM

## 2019-12-11 MED ORDER — HYDROCOD POLST-CPM POLST ER 10-8 MG/5ML PO SUER
5.0000 mL | Freq: Two times a day (BID) | ORAL | 0 refills | Status: AC | PRN
Start: 2019-12-11 — End: 2019-12-16

## 2019-12-11 MED ORDER — PREDNISONE 50 MG PO TABS: ORAL_TABLET | ORAL | 0 refills | Status: DC

## 2019-12-11 NOTE — Discharge Instructions (Signed)
Please go to [facility] for chest x ray.  Please take prednisone once daily for the next five days. You can take

## 2019-12-11 NOTE — ED Triage Notes (Signed)
Pt presents with complaints of non-productive dry cough and head congestion x 2 and a half weeks. Report she was tested for covid last Monday and it wasnegative but she is feeling worse. The cough is worse at night. She has taken mucinex with no relief. Denies any other symptoms. Reports at the beginning she was also having runny nose, sneezing that has subsided.

## 2019-12-11 NOTE — ED Provider Notes (Signed)
Emergency Department Provider Note  ____________________________________________  Time seen: Approximately 10:23 AM  I have reviewed the triage vital signs and the nursing notes.   HISTORY  Chief Complaint Cough   Historian Patient    HPI Leah Gilmore is a 34 y.o. female presents to the emergency department with dry, nonproductive cough for approximately 2-1/2 weeks.  Patient reports that all of her family members had a viral URI-like illness.  Patient and family members were tested for Covid and tested negative.  Family members have since improved but patient states that her cough seems to be getting worse.  Is keeping her up at night.  She denies chest pain, chest tightness or shortness of breath.  No fatigue or fever.  Patient denies daily smoking.  She has had associated hoarseness for 2 and half weeks as well.  No other alleviating measures have been attempted.   Past Medical History:  Diagnosis Date  . Anemia    Never had a blood transfusion  . Iron deficiency anemia 03/11/2017  . Multiple food allergies   . PCOS (polycystic ovarian syndrome) 03/29/2016  . 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 Vitamin D deficiency      Immunizations up to date:  Yes.     Past Medical History:  Diagnosis Date  . Anemia    Never had a blood transfusion  . Iron deficiency anemia 03/11/2017  . Multiple food allergies   . PCOS (polycystic ovarian syndrome) 03/29/2016  . 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 Vitamin D deficiency     Patient Active Problem List   Diagnosis Date Noted  . Iron deficiency anemia 03/11/2017  . PCOS (polycystic ovarian syndrome) 03/29/2016  . 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 GASTRIC ROUX-EN-Y N/A 03/29/2016   Procedure: LAPAROSCOPIC ROUX-EN-Y GASTRIC BYPASS WITH UPPER ENDOSCOPY;  Surgeon: Gaynelle Adu, MD;  Location: [facility];  Service: General;  Laterality: N/A;  . UPPER GI ENDOSCOPY  03/29/2016    Procedure: UPPER GI ENDOSCOPY;  Surgeon: Gaynelle Adu, MD;  Location: [facility];  Service: General;;    Prior to Admission medications   Medication Sig Start Date End Date Taking? Authorizing Provider  Calcium 500 MG tablet Take 500 mg by mouth 3 (three) times daily.    [provider]  chlorpheniramine-HYDROcodone (TUSSIONEX PENNKINETIC ER) 10-8 MG/5ML SUER Take 5 mLs by mouth every 12 (twelve) hours as needed for up to 5 days for cough. 12/11/19 12/16/19  Orvil Feil, PA-C  Multiple Vitamins-Iron (MULTIVITAMIN/IRON PO) Take 1 tablet by mouth 2 (two) times daily.    [provider]  predniSONE (DELTASONE) 50 MG tablet Take one tablet daily for the next five days. 12/11/19   Orvil Feil, PA-C  Semaglutide-Weight Management (WEGOVY) 0.5 MG/0.5ML SOAJ Inject 0.5 mg into the skin once a week. 11/20/19   Filbert Schilder, MD  Vitamin D, Cholecalciferol, 50 MCG (2000 UT) CAPS Take 2,000 Units by mouth.    [provider]  Vitamin D, Ergocalciferol, (DRISDOL) 1.25 MG (50000 UNIT) CAPS capsule Take 1 capsule (50,000 Units total) by mouth once a week. 12/05/19 03/04/20  Filbert Schilder, MD    Allergies Azithromycin and Food  Family History  Problem Relation Age of Onset  . Thyroid disease Mother   . Depression Mother   . Anxiety disorder Mother   . Obesity Mother   . Cancer Father 67       lung cancer, smoker  .  Alcohol abuse Father   . Drug abuse Father   . Liver disease Father   . Diabetes Maternal Grandmother   . Cancer Maternal Grandfather        prostate  . Diabetes Paternal Grandmother   . Hypertension Paternal Grandmother     Social History Social History   Tobacco Use  . Smoking status: Never Smoker  . Smokeless tobacco: Never Used  Vaping Use  . Vaping Use: Never used  Substance Use Topics  . Alcohol use: No    Alcohol/week: 0.0 standard drinks    Comment: rarely  . Drug use: No     Review of Systems  Constitutional: No  fever/chills Eyes:  No discharge ENT: No upper respiratory complaints. Respiratory: Patient has cough.  Gastrointestinal:   No nausea, no vomiting.  No diarrhea.  No constipation. Musculoskeletal: Negative for musculoskeletal pain. Skin: Negative for rash, abrasions, lacerations, ecchymosis.    ____________________________________________   PHYSICAL EXAM:  VITAL SIGNS: ED Triage Vitals  Enc Vitals Group     BP 12/11/19 1001 115/77     Pulse Rate 12/11/19 1001 (!) 58     Resp 12/11/19 1001 16     Temp 12/11/19 1001 98.5 F (36.9 C)     Temp Source 12/11/19 1001 Oral     SpO2 12/11/19 1001 97 %     Weight --      Height --      Head Circumference --      Peak Flow --      Pain Score 12/11/19 1004 0     Pain Loc --      Pain Edu? --      Excl. in GC? --      Constitutional: Alert and oriented. Patient is lying supine. Eyes: Conjunctivae are normal. PERRL. EOMI. Head: Atraumatic. ENT:      Ears: Tympanic membranes are mildly injected with mild effusion bilaterally.       Nose: No congestion/rhinnorhea.      Mouth/Throat: Mucous membranes are moist. Posterior pharynx is mildly erythematous.  Hematological/Lymphatic/Immunilogical: No cervical lymphadenopathy.  Cardiovascular: Normal rate, regular rhythm. Normal S1 and S2.  Good peripheral circulation. Respiratory: Normal respiratory effort without tachypnea or retractions. Lungs CTAB. Good air entry to the bases with no decreased or absent breath sounds. Gastrointestinal: Bowel sounds 4 quadrants. Soft and nontender to palpation. No guarding or rigidity. No palpable masses. No distention. No CVA tenderness. Musculoskeletal: Full range of motion to all extremities. No gross deformities appreciated. Neurologic:  Normal speech and language. No gross focal neurologic deficits are appreciated.  Skin:  Skin is warm, dry and intact. No rash noted. Psychiatric: Mood and affect are normal. Speech and behavior are normal. Patient  exhibits appropriate insight and judgement.    ____________________________________________   LABS (all labs ordered are listed, but only abnormal results are displayed)  Labs Reviewed  NOVEL CORONAVIRUS, NAA   ____________________________________________  EKG   ____________________________________________  RADIOLOGY   No results found.  ____________________________________________    PROCEDURES  Procedure(s) performed:     Procedures     Medications - No data to display   ____________________________________________   INITIAL IMPRESSION / ASSESSMENT AND PLAN / ED COURSE  Pertinent labs & imaging results that were available during my care of the patient were reviewed by me and considered in my medical decision making (see chart for details).      Assessment and Plan:  Cough 34 year old female presents to the emergency department with a nonproductive  cough for the past 2 and half weeks.  Patient was mildly bradycardic at triage but vital signs were otherwise reassuring.  She was resting comfortably in exam room with no increased work of breathing. No adventitious lung sounds were auscultated.  Given long duration of cough with associated hoarseness, will obtain outpatient chest x-ray.  Patient was advised to obtain chest x-ray at [facility] as we do not currently have x-ray today.  Patient was started on prednisone once daily for the next 5 days and Tussionex for cough at night.  Return precautions were given to return to the urgent care with new or worsening symptoms.  A work note was provided.  ____________________________________________  FINAL CLINICAL IMPRESSION(S) / ED DIAGNOSES  Final diagnoses:  Encounter for laboratory testing for COVID-19 virus      NEW MEDICATIONS STARTED DURING THIS VISIT:  ED Discharge Orders         Ordered    DG Chest 2 View        12/11/19 1015    predniSONE (DELTASONE) 50 MG tablet         12/11/19 1018    chlorpheniramine-HYDROcodone (TUSSIONEX PENNKINETIC ER) 10-8 MG/5ML SUER  Every 12 hours PRN        12/11/19 1018              This chart was dictated using voice recognition software/Dragon. Despite best efforts to proofread, errors can occur which can change the meaning. Any change was purely unintentional.     Orvil Feil, PA-C 12/11/19 1027

## 2019-12-13 LAB — SARS-COV-2, NAA 2 DAY TAT

## 2019-12-13 LAB — NOVEL CORONAVIRUS, NAA: SARS-CoV-2, NAA: NOT DETECTED

## 2019-12-20 ENCOUNTER — Other Ambulatory Visit: Payer: Self-pay

## 2019-12-20 ENCOUNTER — Encounter (INDEPENDENT_AMBULATORY_CARE_PROVIDER_SITE_OTHER): Payer: Self-pay | Admitting: Family Medicine

## 2019-12-20 ENCOUNTER — Telehealth (INDEPENDENT_AMBULATORY_CARE_PROVIDER_SITE_OTHER): Payer: No Typology Code available for payment source | Admitting: Psychology

## 2019-12-20 ENCOUNTER — Ambulatory Visit (INDEPENDENT_AMBULATORY_CARE_PROVIDER_SITE_OTHER): Payer: No Typology Code available for payment source | Admitting: Family Medicine

## 2019-12-20 VITALS — BP 102/66 | HR 71 | Temp 98.9°F | Ht 67.0 in | Wt 202.0 lb

## 2019-12-20 DIAGNOSIS — F32A Depression, unspecified: Secondary | ICD-10-CM | POA: Diagnosis not present

## 2019-12-20 DIAGNOSIS — R7401 Elevation of levels of liver transaminase levels: Secondary | ICD-10-CM

## 2019-12-20 DIAGNOSIS — Z6831 Body mass index (BMI) 31.0-31.9, adult: Secondary | ICD-10-CM

## 2019-12-20 DIAGNOSIS — F419 Anxiety disorder, unspecified: Secondary | ICD-10-CM

## 2019-12-20 DIAGNOSIS — E669 Obesity, unspecified: Secondary | ICD-10-CM

## 2019-12-20 DIAGNOSIS — Z9189 Other specified personal risk factors, not elsewhere classified: Secondary | ICD-10-CM

## 2019-12-20 DIAGNOSIS — F3289 Other specified depressive episodes: Secondary | ICD-10-CM

## 2019-12-20 MED ORDER — WEGOVY 0.5 MG/0.5ML ~~LOC~~ SOAJ: 0.5000 mg | SUBCUTANEOUS | 0 refills | Status: DC

## 2019-12-20 MED ORDER — WEGOVY 0.5 MG/0.5ML ~~LOC~~ SOAJ
0.5000 mg | SUBCUTANEOUS | 0 refills | Status: DC
Start: 2019-12-20 — End: 2019-12-20

## 2019-12-25 NOTE — Progress Notes (Signed)
Chief Complaint:   OBESITY Leah Gilmore is here to discuss her progress with her obesity treatment plan along with follow-up of her obesity related diagnoses. Leah Gilmore is on following a lower carbohydrate, vegetable and lean protein rich diet plan and states she is following her eating plan approximately 85-90% of the time. Leah Gilmore states she is going to the gym for 30 minutes 3 times per week.  Today's visit was #: 12 Starting weight: 209 lbs Starting date: 04/30/2019 Today's weight: 202 lbs Today's date: 12/20/2019 Total lbs lost to date: 7 lbs Total lbs lost since last in-office visit: 8 lbs  Interim History: Leah Gilmore has been finding her mal plan much easier to follow.  She has now been able to eat food and keep it down without issue.  She has not had many cravings for carbohydrates.  She is doing a protein shake 1-2 times per day.  The first one may replace breakfast, but the second is in conjunction with a meal.    Plan:  Refill Wegovy 0.5 mg subcutaneously weekly.  Subjective:   1. Transaminitis Last LFT minimally elevated.  She is on no liver metabolism medication.  Lab Results  Component Value Date   ALT 38 (H) 04/17/2019   AST 26 04/17/2019   ALKPHOS 67 03/16/2018   BILITOT 1.1 04/17/2019   2. Anxiety and depression She is not on any medication.  Seeing Dr. Dewaine Conger.  Depression-wise, she feels okay.  Anxiety symptoms have increased due to house hunting.  3. At risk for malnutrition Leah Gilmore is at increased risk for malnutrition due to due to bariatric surgery.  Assessment/Plan:   1. Transaminitis Repeat labs in 1 month.  2. Anxiety and depression Follow-up with Dr. Dewaine Conger.  Follow-up with patient at next appointment.  3. At risk for malnutrition Leah Gilmore was given approximately 15 minutes of counseling today regarding prevention of malnutrition and ways to meet macronutrient goals.  4. Class 1 obesity with serious comorbidity and body mass index (BMI) of 31.0 to 31.9 in adult,  unspecified obesity type  -Refill Semaglutide-Weight Management (WEGOVY) 0.5 MG/0.5ML SOAJ; Inject 0.5 mg into the skin once a week.  Dispense: 2 mL; Refill: 0  Leah Gilmore is currently in the action stage of change. As such, her goal is to continue with weight loss efforts. She has agreed to following a lower carbohydrate, vegetable and lean protein rich diet plan.   Exercise goals: All adults should avoid inactivity. Some physical activity is better than none, and adults who participate in any amount of physical activity gain some health benefits.  Behavioral modification strategies: increasing lean protein intake, meal planning and cooking strategies, keeping healthy foods in the home and planning for success.  Leah Gilmore has agreed to follow-up with our clinic in 3 weeks. She was informed of the importance of frequent follow-up visits to maximize her success with intensive lifestyle modifications for her multiple health conditions.   Objective:   Blood pressure 102/66, pulse 71, temperature 98.9 F (37.2 C), temperature source Oral, height 5\' 7"  (1.702 m), weight 202 lb (91.6 kg), last menstrual period 11/23/2019, SpO2 100 %. Body mass index is 31.64 kg/m.  General: Cooperative, alert, well developed, in no acute distress. HEENT: Conjunctivae and lids unremarkable. Cardiovascular: Regular rhythm.  Lungs: Normal work of breathing. Neurologic: No focal deficits.   Lab Results  Component Value Date   CREATININE 0.71 04/17/2019   BUN 17 04/17/2019   NA 140 04/17/2019   K 4.3 04/17/2019   CL 105  04/17/2019   CO2 24 04/17/2019   Lab Results  Component Value Date   ALT 38 (H) 04/17/2019   AST 26 04/17/2019   ALKPHOS 67 03/16/2018   BILITOT 1.1 04/17/2019   Lab Results  Component Value Date   HGBA1C 4.8 05/01/2019   HGBA1C 4.8 04/30/2019   Lab Results  Component Value Date   TSH 2.14 04/17/2019   Lab Results  Component Value Date   CHOL 168 04/17/2019   HDL 71 04/17/2019   LDLCALC  82 04/17/2019   TRIG 74 04/17/2019   CHOLHDL 2.4 04/17/2019   Lab Results  Component Value Date   WBC 3.2 (L) 10/11/2019   HGB 10.0 (L) 10/11/2019   HCT 32.9 (L) 10/11/2019   MCV 88.7 10/11/2019   PLT 256 10/11/2019   Lab Results  Component Value Date   IRON 63 10/11/2019   TIBC 400 10/11/2019   FERRITIN 5 (L) 10/11/2019   Attestation Statements:   Reviewed by clinician on day of visit: allergies, medications, problem list, medical history, surgical history, family history, social history, and previous encounter notes.  I, Insurance claims handler, CMA, am acting as transcriptionist for Reuben Likes, MD.  I have reviewed the above documentation for accuracy and completeness, and I agree with the above. - Katherina Mires, MD

## 2019-12-27 NOTE — Progress Notes (Signed)
  Office: [phone]  /  Fax: [phone]    Date: January 10, 2020   Appointment Start Time: 9:33am Duration: 25 minutes Provider: Lawerance Cruel, Psy.D. Type of Session: Individual Therapy  Location of Patient: Parked in car at [facility] Location of Provider: Provider's Home Type of Contact: Telepsychological Visit via MyChart Video Visit  Session Content: This provider called Leah Gilmore at 9:32am as she did not present for the telepsychological appointment. She indicated she thought her appointment with Dr. Lawson Radar was first; therefore, she was on her way to the clinic. She stated she would be able to join shortly, as she was pulling into the clinic parking lot. As such, today's appointment was initiated 3 minutes late.  Leah Gilmore is a 34 y.o. female presenting for a follow-up appointment to address the previously established treatment goal of increasing coping skills. Today's appointment was a telepsychological visit due to COVID-19. Leah Gilmore provided verbal consent for today's telepsychological appointment and she is aware she is responsible for securing confidentiality on her end of the session. Prior to proceeding with today's appointment, Leah Gilmore's physical location at the time of this appointment was obtained as well a phone number she could be reached at in the event of technical difficulties. Leah Gilmore and this provider participated in today's telepsychological service.   This provider conducted a brief check-in. Leah Gilmore shared about recent events, including selling and buying a house and her son testing positive for COVID-19. Leah Gilmore discussed increased hunger pangs, noting she is eating regularly. This was further explored and she was encouraged to discuss the aforementioned with Dr. Lawson Radar; she agreed. She described increased awareness of hunger cues and decreased guilt when eating. Leah Gilmore and this provider further discussed the dieting mentality and success to date was reflected. Leah Gilmore was receptive to  today's appointment as evidenced by openness to sharing and responsiveness to feedback.  Mental Status Examination:  Appearance: well groomed and appropriate hygiene  Behavior: appropriate to circumstances Mood: euthymic Affect: mood congruent Speech: normal in rate, volume, and tone Eye Contact: appropriate Psychomotor Activity: appropriate Gait: unable to assess Thought Process: linear, logical, and goal directed  Thought Content/Perception: no hallucinations, delusions, bizarre thinking or behavior reported or observed and no evidence of suicidal and homicidal ideation, plan, and intent Orientation: time, person, place, and purpose of appointment Memory/Concentration: memory, attention, language, and fund of knowledge intact  Insight/Judgment: good  Interventions:  Conducted a brief chart review Provided empathic reflections and validation Employed supportive psychotherapy interventions to facilitate reduced distress and to improve coping skills with identified stressors Employed insight oriented and cognitive psychotherapy interventions to identify and modify anxiety/mood producing thoughts, beliefs, and negative self-appraisals contributing to distress and emotional eating  DSM-5 Diagnosis(es): 311 (F32.8) Other Specified Depressive Disorder, Emotional Eating Behaviors and 300.00 (F41.9) Unspecified Anxiety Disorder  Treatment Goal & Progress: During the initial appointment with this provider, the following treatment goal was established: increase coping skills. Leah Gilmore has demonstrated progress in her goal as evidenced by increased awareness of hunger patterns and increased awareness of triggers for emotional eating. Leah Gilmore also continues to demonstrate willingness to engage in learned skill(s).  Plan: The next appointment will be scheduled in approximately two weeks, which will be via MyChart Video Visit. The next session will focus on working towards the established treatment goal and  termination.

## 2020-01-04 ENCOUNTER — Other Ambulatory Visit: Payer: No Typology Code available for payment source

## 2020-01-04 DIAGNOSIS — Z20822 Contact with and (suspected) exposure to covid-19: Secondary | ICD-10-CM

## 2020-01-05 LAB — NOVEL CORONAVIRUS, NAA: SARS-CoV-2, NAA: NOT DETECTED

## 2020-01-05 LAB — SARS-COV-2, NAA 2 DAY TAT

## 2020-01-10 ENCOUNTER — Other Ambulatory Visit: Payer: Self-pay

## 2020-01-10 ENCOUNTER — Encounter (INDEPENDENT_AMBULATORY_CARE_PROVIDER_SITE_OTHER): Payer: Self-pay | Admitting: Family Medicine

## 2020-01-10 ENCOUNTER — Telehealth (INDEPENDENT_AMBULATORY_CARE_PROVIDER_SITE_OTHER): Payer: No Typology Code available for payment source | Admitting: Psychology

## 2020-01-10 ENCOUNTER — Ambulatory Visit (INDEPENDENT_AMBULATORY_CARE_PROVIDER_SITE_OTHER): Payer: No Typology Code available for payment source | Admitting: Family Medicine

## 2020-01-10 VITALS — BP 101/57 | HR 59 | Temp 98.9°F | Ht 67.0 in | Wt 200.0 lb

## 2020-01-10 DIAGNOSIS — E669 Obesity, unspecified: Secondary | ICD-10-CM

## 2020-01-10 DIAGNOSIS — Z6831 Body mass index (BMI) 31.0-31.9, adult: Secondary | ICD-10-CM

## 2020-01-10 DIAGNOSIS — E559 Vitamin D deficiency, unspecified: Secondary | ICD-10-CM | POA: Diagnosis not present

## 2020-01-10 DIAGNOSIS — F419 Anxiety disorder, unspecified: Secondary | ICD-10-CM

## 2020-01-10 DIAGNOSIS — F3289 Other specified depressive episodes: Secondary | ICD-10-CM

## 2020-01-10 MED ORDER — WEGOVY 1 MG/0.5ML ~~LOC~~ SOAJ: 1.0000 mg | SUBCUTANEOUS | 0 refills | Status: DC

## 2020-01-10 NOTE — Progress Notes (Signed)
  Office: [phone]  /  Fax: [phone]    Date: January 23, 2020   Appointment Start Time: 9:00am Duration: 21 minutes Provider: Lawerance Cruel, Psy.D. Type of Session: Individual Therapy  Location of Patient: Home Location of Provider: Provider's Home Type of Contact: Telepsychological Visit via MyChart Video Visit  Session Content: Leah Gilmore is a 34 y.o. female presenting for a follow-up appointment to address the previously established treatment goal of increasing coping skills. Today's appointment was a telepsychological visit due to COVID-19. Leah Gilmore provided verbal consent for today's telepsychological appointment and she is aware she is responsible for securing confidentiality on her end of the session. Prior to proceeding with today's appointment, Leah Gilmore's physical location at the time of this appointment was obtained as well a phone number she could be reached at in the event of technical difficulties. Leah Gilmore and this provider participated in today's telepsychological service.   This provider conducted a brief check-in. Leah Gilmore shared about recent events, noting she is "handling stress well." Additionally, a plan was developed to help Leah Gilmore cope with emotional eating in the future using learned skills. She wrote down the following plan: focus on hydration; be prepared with snacks congruent to the meal plan; pause to ask questions when experiencing cravings/urges (e.g., Am I really hungry?, Is there something bothering me?, and Will I feel better if I eat?); and engage in discussed coping strategies after going through the aforementioned questions. Leah Gilmore was receptive to today's appointment as evidenced by openness to sharing, responsiveness to feedback, and willingness to continue engaging in learned skills.  Mental Status Examination:  Appearance: well groomed and appropriate hygiene  Behavior: appropriate to circumstances Mood: euthymic Affect: mood congruent Speech: normal in rate, volume, and  tone Eye Contact: appropriate Psychomotor Activity: appropriate Gait: unable to assess Thought Process: linear, logical, and goal directed  Thought Content/Perception: no hallucinations, delusions, bizarre thinking or behavior reported or observed and no evidence of suicidal and homicidal ideation, plan, and intent Orientation: time, person, place, and purpose of appointment Memory/Concentration: memory, attention, language, and fund of knowledge intact  Insight/Judgment: good  Interventions:  Conducted a brief chart review Provided empathic reflections and validation Employed supportive psychotherapy interventions to facilitate reduced distress and to improve coping skills with identified stressors Reviewed learned skills  DSM-5 Diagnosis(es): 311 (F32.8) Other Specified Depressive Disorder, Emotional Eating Behaviors and 300.00 (F41.9) Unspecified Anxiety Disorder  Treatment Goal & Progress: During the initial appointment with this provider, the following treatment goal was established: increase coping skills. Leah Gilmore demonstrated progress in her goal as evidenced by increased awareness of hunger patterns, increased awareness of triggers for emotional eating and reduction in emotional eating. Leah Gilmore also continues to demonstrate willingness to engage in learned skill(s).  Plan: Today was Leah Gilmore's last appointment with this provider. She acknowledged understanding that she may request a follow-up appointment with this provider in the future (following this provider's maternity leave as previously discussed) as long as she is still established with the clinic. No further follow-up planned by this provider.

## 2020-01-14 NOTE — Progress Notes (Signed)
Chief Complaint:   OBESITY Leah Gilmore is here to discuss her progress with her obesity treatment plan along with follow-up of her obesity related diagnoses. Leah Gilmore is on following a lower carbohydrate, vegetable and lean protein rich diet plan and states she is following her eating plan approximately 80% of the time. Leah Gilmore states she is doing 0 minutes 0 times per week.  Today's visit was #: 13 Starting weight: 209 lbs Starting date: 04/30/2019 Today's weight: 200 lbs Today's date: 01/10/2020 Total lbs lost to date: 9 Total lbs lost since last in-office visit: 2  Interim History: Larrisha listed her house for sale 4 days ago, and she hope her house selling process is relatively easy. She notes all of the movement of moving has increased her hunger. She is not feeling much difference with Wegovy currently. She has plans for [employer] at the end of the month. Her biggest obstacle in next few weeks is taking time to eat.  Subjective:   1. Vitamin D deficiency Leah Gilmore denies nausea, vomiting, or muscle weakness, but notes fatigue. She is on prescription Vit D.  Assessment/Plan:   1. Vitamin D deficiency Low Vitamin D level contributes to fatigue and are associated with obesity, breast, and colon cancer. Karmen agreed to continue taking prescription Vitamin D 50,000 IU every week, no refill needed. She will follow-up for routine testing of Vitamin D, at least 2-3 times per year to avoid over-replacement.  2. Class 1 obesity with serious comorbidity and body mass index (BMI) of 31.0 to 31.9 in adult, unspecified obesity type Leah Gilmore is currently in the action stage of change. As such, her goal is to continue with weight loss efforts. She has agreed to following a lower carbohydrate, vegetable and lean protein rich diet plan.   We discussed various medication options to help [facility] with her weight loss efforts and we both agreed to increase to 1 mg, and we will refill for 1 month.  - Semaglutide-Weight Management  (WEGOVY) 1 MG/0.5ML SOAJ; Inject 1 mg into the skin once a week.  Dispense: 2 mL; Refill: 0  Exercise goals: As is.  Behavioral modification strategies: increasing lean protein intake, meal planning and cooking strategies, keeping healthy foods in the home, travel eating strategies and holiday eating strategies .  Leah Gilmore has agreed to follow-up with our clinic in 5 weeks. She was informed of the importance of frequent follow-up visits to maximize her success with intensive lifestyle modifications for her multiple health conditions.   Objective:   Blood pressure (!) 101/57, pulse (!) 59, temperature 98.9 F (37.2 C), temperature source Oral, height 5\' 7"  (1.702 m), weight 200 lb (90.7 kg), SpO2 100 %. Body mass index is 31.32 kg/m.  General: Cooperative, alert, well developed, in no acute distress. HEENT: Conjunctivae and lids unremarkable. Cardiovascular: Regular rhythm.  Lungs: Normal work of breathing. Neurologic: No focal deficits.   Lab Results  Component Value Date   CREATININE 0.71 04/17/2019   BUN 17 04/17/2019   NA 140 04/17/2019   K 4.3 04/17/2019   CL 105 04/17/2019   CO2 24 04/17/2019   Lab Results  Component Value Date   ALT 38 (H) 04/17/2019   AST 26 04/17/2019   ALKPHOS 67 03/16/2018   BILITOT 1.1 04/17/2019   Lab Results  Component Value Date   HGBA1C 4.8 05/01/2019   HGBA1C 4.8 04/30/2019   No results found for: INSULIN Lab Results  Component Value Date   TSH 2.14 04/17/2019   Lab Results  Component Value Date   CHOL 168 04/17/2019   HDL 71 04/17/2019   LDLCALC 82 04/17/2019   TRIG 74 04/17/2019   CHOLHDL 2.4 04/17/2019   Lab Results  Component Value Date   WBC 3.2 (L) 10/11/2019   HGB 10.0 (L) 10/11/2019   HCT 32.9 (L) 10/11/2019   MCV 88.7 10/11/2019   PLT 256 10/11/2019   Lab Results  Component Value Date   IRON 63 10/11/2019   TIBC 400 10/11/2019   FERRITIN 5 (L) 10/11/2019   Attestation Statements:   Reviewed by clinician on day  of visit: allergies, medications, problem list, medical history, surgical history, family history, social history, and previous encounter notes.  Time spent on visit including pre-visit chart review and post-visit care and charting was 17 minutes.    I, Kalaya Infantino, am acting as transcriptionist for Reuben Likes, MD. I have reviewed the above documentation for accuracy and completeness, and I agree with the above. - Katherina Mires, MD

## 2020-01-23 ENCOUNTER — Telehealth (INDEPENDENT_AMBULATORY_CARE_PROVIDER_SITE_OTHER): Payer: No Typology Code available for payment source | Admitting: Psychology

## 2020-01-23 DIAGNOSIS — F3289 Other specified depressive episodes: Secondary | ICD-10-CM

## 2020-01-23 DIAGNOSIS — F419 Anxiety disorder, unspecified: Secondary | ICD-10-CM

## 2020-02-13 ENCOUNTER — Other Ambulatory Visit: Payer: Self-pay

## 2020-02-13 ENCOUNTER — Encounter (INDEPENDENT_AMBULATORY_CARE_PROVIDER_SITE_OTHER): Payer: Self-pay | Admitting: Family Medicine

## 2020-02-13 ENCOUNTER — Ambulatory Visit (INDEPENDENT_AMBULATORY_CARE_PROVIDER_SITE_OTHER): Payer: No Typology Code available for payment source | Admitting: Family Medicine

## 2020-02-13 VITALS — BP 96/59 | HR 83 | Temp 98.4°F | Ht 67.0 in | Wt 191.0 lb

## 2020-02-13 DIAGNOSIS — E669 Obesity, unspecified: Secondary | ICD-10-CM | POA: Diagnosis not present

## 2020-02-13 DIAGNOSIS — Z9189 Other specified personal risk factors, not elsewhere classified: Secondary | ICD-10-CM | POA: Diagnosis not present

## 2020-02-13 DIAGNOSIS — E66811 Obesity, class 1: Secondary | ICD-10-CM

## 2020-02-13 DIAGNOSIS — E559 Vitamin D deficiency, unspecified: Secondary | ICD-10-CM | POA: Diagnosis not present

## 2020-02-13 DIAGNOSIS — Z6831 Body mass index (BMI) 31.0-31.9, adult: Secondary | ICD-10-CM | POA: Diagnosis not present

## 2020-02-13 MED ORDER — WEGOVY 1 MG/0.5ML ~~LOC~~ SOAJ: 1.0000 mg | SUBCUTANEOUS | 0 refills | Status: DC

## 2020-02-13 MED ORDER — VITAMIN D (ERGOCALCIFEROL) 1.25 MG (50000 UNIT) PO CAPS: 50000.0000 [IU] | ORAL_CAPSULE | ORAL | 0 refills | Status: DC

## 2020-02-14 NOTE — Progress Notes (Signed)
Chief Complaint:   OBESITY Leah Gilmore is here to discuss her progress with her obesity treatment plan along with follow-up of her obesity related diagnoses. Leah Gilmore is on following a lower carbohydrate, vegetable and lean protein rich diet plan and states she is following her eating plan approximately 70% of the time. Leah Gilmore states she was walking at [employer] 20,000 steps 7 times per week.   Today's visit was #: 14 Starting weight: 209 lbs Starting date: 04/30/2019 Today's weight: 191 lbs Today's date: 02/13/2020 Total lbs lost to date: 18 Total lbs lost since last in-office visit: 9  Interim History: Gearldean just returned from [place] and [address] with her kids. She is feeling tired and ready to relax. She did a lot of walking at [employer], and she did eat and drink indulgently. She is planning on getting back on track with meal prepping and exercise.  Subjective:   1. Vitamin D deficiency Leah Gilmore denies nausea, vomiting, or muscle weakness, but she notes fatigue. She is on prescription Vit D.  2. At risk for osteoporosis Leah Gilmore is at higher risk of osteopenia and osteoporosis due to Vitamin D deficiency.   Assessment/Plan:   1. Vitamin D deficiency Low Vitamin D level contributes to fatigue and are associated with obesity, breast, and colon cancer. We will refill prescription Vitamin D for 1 month. Leah Gilmore will follow-up for routine testing of Vitamin D, at least 2-3 times per year to avoid over-replacement.  - Vitamin D, Ergocalciferol, (DRISDOL) 1.25 MG (50000 UNIT) CAPS capsule; Take 1 capsule (50,000 Units total) by mouth once a week.  Dispense: 4 capsule; Refill: 0  2. At risk for osteoporosis Leah Gilmore was given approximately 15 minutes of osteoporosis prevention counseling today. Aven is at risk for osteopenia and osteoporosis due to her Vitamin D deficiency. She was encouraged to take her Vitamin D and follow her higher calcium diet and increase strengthening exercise to help strengthen her bones and decrease  her risk of osteopenia and osteoporosis.  Repetitive spaced learning was employed today to elicit superior memory formation and behavioral change.  3. Class 1 obesity with serious comorbidity and body mass index (BMI) of 31.0 to 31.9 in adult, unspecified obesity type Leah Gilmore is currently in the action stage of change. As such, her goal is to continue with weight loss efforts. She has agreed to following a lower carbohydrate, vegetable and lean protein rich diet plan.   We discussed various medication options to help Leah Gilmore with her weight loss efforts and we both agreed to start Leah Gilmore 1 mg SubQ weekly #2 mL with no refills.  - Semaglutide-Weight Management (WEGOVY) 1 MG/0.5ML SOAJ; Inject 1 mg into the skin once a week.  Dispense: 2 mL; Refill: 0  Exercise goals: All adults should avoid inactivity. Some physical activity is better than none, and adults who participate in any amount of physical activity gain some health benefits.  Behavioral modification strategies: increasing lean protein intake, meal planning and cooking strategies, keeping healthy foods in the home and planning for success.  Leah Gilmore has agreed to follow-up with our clinic in 3 weeks. She was informed of the importance of frequent follow-up visits to maximize her success with intensive lifestyle modifications for her multiple health conditions.   Objective:   Blood pressure (!) 96/59, pulse 83, temperature 98.4 F (36.9 C), temperature source Oral, height 5\' 7"  (1.702 m), weight 191 lb (86.6 kg), SpO2 100 %. Body mass index is 29.91 kg/m.  General: Cooperative, alert, well developed, in no  acute distress. HEENT: Conjunctivae and lids unremarkable. Cardiovascular: Regular rhythm.  Lungs: Normal work of breathing. Neurologic: No focal deficits.   Lab Results  Component Value Date   CREATININE 0.71 04/17/2019   BUN 17 04/17/2019   NA 140 04/17/2019   K 4.3 04/17/2019   CL 105 04/17/2019   CO2 24 04/17/2019   Lab Results   Component Value Date   ALT 38 (H) 04/17/2019   AST 26 04/17/2019   ALKPHOS 67 03/16/2018   BILITOT 1.1 04/17/2019   Lab Results  Component Value Date   HGBA1C 4.8 05/01/2019   HGBA1C 4.8 04/30/2019   No results found for: INSULIN Lab Results  Component Value Date   TSH 2.14 04/17/2019   Lab Results  Component Value Date   CHOL 168 04/17/2019   HDL 71 04/17/2019   LDLCALC 82 04/17/2019   TRIG 74 04/17/2019   CHOLHDL 2.4 04/17/2019   Lab Results  Component Value Date   WBC 3.2 (L) 10/11/2019   HGB 10.0 (L) 10/11/2019   HCT 32.9 (L) 10/11/2019   MCV 88.7 10/11/2019   PLT 256 10/11/2019   Lab Results  Component Value Date   IRON 63 10/11/2019   TIBC 400 10/11/2019   FERRITIN 5 (L) 10/11/2019   Attestation Statements:   Reviewed by clinician on day of visit: allergies, medications, problem list, medical history, surgical history, family history, social history, and previous encounter notes.   I, Millicent Blazejewski, am acting as transcriptionist for Reuben Likes, MD.  I have reviewed the above documentation for accuracy and completeness, and I agree with the above. - Katherina Mires, MD

## 2020-02-28 ENCOUNTER — Other Ambulatory Visit: Payer: No Typology Code available for payment source

## 2020-03-12 ENCOUNTER — Encounter (INDEPENDENT_AMBULATORY_CARE_PROVIDER_SITE_OTHER): Payer: Self-pay | Admitting: Family Medicine

## 2020-03-12 ENCOUNTER — Other Ambulatory Visit (INDEPENDENT_AMBULATORY_CARE_PROVIDER_SITE_OTHER): Payer: Self-pay | Admitting: Family Medicine

## 2020-03-12 ENCOUNTER — Telehealth (INDEPENDENT_AMBULATORY_CARE_PROVIDER_SITE_OTHER): Payer: No Typology Code available for payment source | Admitting: Family Medicine

## 2020-03-12 ENCOUNTER — Other Ambulatory Visit: Payer: Self-pay

## 2020-03-12 DIAGNOSIS — Z6830 Body mass index (BMI) 30.0-30.9, adult: Secondary | ICD-10-CM | POA: Diagnosis not present

## 2020-03-12 DIAGNOSIS — E559 Vitamin D deficiency, unspecified: Secondary | ICD-10-CM | POA: Diagnosis not present

## 2020-03-12 DIAGNOSIS — E669 Obesity, unspecified: Secondary | ICD-10-CM

## 2020-03-12 DIAGNOSIS — D508 Other iron deficiency anemias: Secondary | ICD-10-CM | POA: Diagnosis not present

## 2020-03-12 MED ORDER — WEGOVY 1 MG/0.5ML ~~LOC~~ SOAJ: 1.0000 mg | SUBCUTANEOUS | 0 refills | Status: DC

## 2020-03-12 MED ORDER — VITAMIN D (ERGOCALCIFEROL) 1.25 MG (50000 UNIT) PO CAPS: 50000.0000 [IU] | ORAL_CAPSULE | ORAL | 0 refills | Status: DC

## 2020-03-19 ENCOUNTER — Other Ambulatory Visit (INDEPENDENT_AMBULATORY_CARE_PROVIDER_SITE_OTHER): Payer: Self-pay | Admitting: Family Medicine

## 2020-03-19 LAB — BASIC METABOLIC PANEL
Glucose: 86
Sodium: 139 (ref 137–147)

## 2020-03-19 LAB — HEPATIC FUNCTION PANEL
AST: 19 (ref 13–35)
Alkaline Phosphatase: 53 (ref 25–125)
Bilirubin, Total: 1.1

## 2020-03-19 LAB — COMPREHENSIVE METABOLIC PANEL
Albumin: 4.3 (ref 3.5–5.0)
GFR calc Af Amer: 123
GFR calc non Af Amer: 106

## 2020-03-19 LAB — IRON,TIBC AND FERRITIN PANEL: Ferritin: 5

## 2020-03-19 LAB — CBC AND DIFFERENTIAL: WBC: 5.9

## 2020-03-20 LAB — CBC
HCT: 30.1 % — ABNORMAL LOW (ref 35.0–45.0)
Hemoglobin: 9.6 g/dL — ABNORMAL LOW (ref 11.7–15.5)
MCH: 27.1 pg (ref 27.0–33.0)
MCHC: 31.9 g/dL — ABNORMAL LOW (ref 32.0–36.0)
MCV: 85 fL (ref 80.0–100.0)
MPV: 12.5 fL (ref 7.5–12.5)
Platelets: 346 10*3/uL (ref 140–400)
RBC: 3.54 10*6/uL — ABNORMAL LOW (ref 3.80–5.10)
RDW: 14.2 % (ref 11.0–15.0)
WBC: 5.9 10*3/uL (ref 3.8–10.8)

## 2020-03-20 LAB — COMPLETE METABOLIC PANEL WITH GFR
AG Ratio: 1.4 (calc) (ref 1.0–2.5)
ALT: 11 U/L (ref 6–29)
AST: 19 U/L (ref 10–30)
Albumin: 4.3 g/dL (ref 3.6–5.1)
Alkaline phosphatase (APISO): 53 U/L (ref 31–125)
BUN: 12 mg/dL (ref 7–25)
CO2: 25 mmol/L (ref 20–32)
Calcium: 9.7 mg/dL (ref 8.6–10.2)
Chloride: 106 mmol/L (ref 98–110)
Creat: 0.74 mg/dL (ref 0.50–1.10)
GFR, Est African American: 123 mL/min/{1.73_m2} (ref >=60)
GFR, Est Non African American: 106 mL/min/{1.73_m2} (ref >=60)
Globulin: 3.1 g/dL (calc) (ref 1.9–3.7)
Glucose, Bld: 86 mg/dL (ref 65–139)
Potassium: 4.2 mmol/L (ref 3.5–5.3)
Sodium: 139 mmol/L (ref 135–146)
Total Bilirubin: 1.1 mg/dL (ref 0.2–1.2)
Total Protein: 7.4 g/dL (ref 6.1–8.1)

## 2020-03-20 LAB — IRON, TOTAL/TOTAL IRON BINDING CAP
%SAT: 6 % (calc) — ABNORMAL LOW (ref 16–45)
Iron: 24 ug/dL — ABNORMAL LOW (ref 40–190)
TIBC: 404 mcg/dL (calc) (ref 250–450)

## 2020-03-20 LAB — VITAMIN D 25 HYDROXY (VIT D DEFICIENCY, FRACTURES): Vit D, 25-Hydroxy: 37 ng/mL (ref 30–100)

## 2020-03-20 LAB — FERRITIN: Ferritin: 5 ng/mL — ABNORMAL LOW (ref 16–154)

## 2020-03-20 LAB — RETICULOCYTES
ABS Retic: 28320 cells/uL (ref 20000–8000)
Retic Ct Pct: 0.8 %

## 2020-03-20 LAB — FOLATE: Folate: 11.7 ng/mL

## 2020-03-20 LAB — VITAMIN B12: Vitamin B-12: 815 pg/mL (ref 200–1100)

## 2020-03-20 NOTE — Progress Notes (Signed)
TeleHealth Visit:  Due to the COVID-19 pandemic, this visit was completed with telemedicine (audio/video) technology to reduce patient and provider exposure as well as to preserve personal protective equipment.   Leah Gilmore has verbally consented to this TeleHealth visit. The patient is located at home, the provider is located at the [facility] office. The participants in this visit include the listed provider,  patient and her daughter. The visit was conducted today via Mychart Video.  Chief Complaint: OBESITY Leah Gilmore is here to discuss her progress with her obesity treatment plan along with follow-up of her obesity related diagnoses. Leah Gilmore is on the Low Carb plan and states she is following her eating plan approximately 80% of the time. Leah Gilmore states she is working out with her trainer 30 minutes 4 times per week.  Today's visit was #: 15 Starting weight: 209 lbs Starting date: 04/30/19  Interim History: Leah Gilmore was converted to a Mychart visit and is waiting for a Covid test for her daughter. She has been unpacking her house. She adhered to controlling her portion sizes over Christmas. She is trying to increase her water intake. Leah Gilmore reports that her weight is 186 lbs today.  Subjective:   1. Vitamin D deficiency Leah Gilmore's Vitamin D level was 53 on 10/11/19. She is currently taking Vitamin D 50,000 units per week. She notes fatigue and denies nausea, vomiting or muscle weakness.  2. Other iron deficiency anemia She has a history of needing iron infusions and notes fatigue. Labs are pending.  CBC Latest Ref Rng & Units 03/19/2020 10/11/2019 10/11/2019  WBC 3.8 - 10.8 Thousand/uL 5.9 3.2(L) 3.2  Hemoglobin 11.7 - 15.5 g/dL 2.7(O) 10.0(L) -  Hematocrit 35.0 - 45.0 % 30.1(L) 32.9(L) -  Platelets 140 - 400 Thousand/uL 346 256 -   Lab Results  Component Value Date   IRON 24 (L) 03/19/2020   TIBC 404 03/19/2020   FERRITIN 5 (L) 03/19/2020   Lab Results  Component Value Date   VITAMINB12 815  03/19/2020    Assessment/Plan:   1. Vitamin D deficiency Low Vitamin D level contributes to fatigue and are associated with obesity, breast, and colon cancer. She agrees to continue to take prescription Vitamin D @50 ,000 IU every week #4 with no refills and will follow-up for routine testing of Vitamin D, at least 2-3 times per year to avoid over-replacement.  - Vitamin D, Ergocalciferol, (DRISDOL) 1.25 MG (50000 UNIT) CAPS capsule; Take 1 capsule (50,000 Units total) by mouth once a week.  Dispense: 4 capsule; Refill: 0  2. Other iron deficiency anemia  We will follow up labs at her next appointment. Leah Gilmore is going to try to go in next week for blood draw. Orders and follow up as documented in patient record.  Counseling . Iron is essential for our bodies to make red blood cells.  Reasons that someone may be deficient include: an iron-deficient diet (more likely in those following vegan or vegetarian diets), women with heavy menses, patients with GI disorders or poor absorption, patients that have had bariatric surgery, frequent blood donors, patients with cancer, and patients with heart disease.   Leah Gilmore An iron supplement has been recommended. This is found over-the-counter.  Marland Kitchen foods include dark leafy greens, red and white meats, eggs, seafood, and beans.   . Certain foods and drinks prevent your body from absorbing iron properly. Avoid eating these foods in the same meal as iron-rich foods or with iron supplements. These foods include: coffee, black tea,  and red wine; milk, dairy products, and foods that are high in calcium; beans and soybeans; whole grains.  . Constipation can be a side effect of iron supplementation. Increased water and fiber intake are helpful. Water goal: > 2 liters/day. Fiber goal: > 25 grams/day.   3. Class 1 obesity with serious comorbidity and body mass index (BMI) of 30.0 to 30.9 in adult, unspecified obesity type  - Semaglutide-Weight Management (WEGOVY) 1  MG/0.5ML SOAJ; Inject 1 mg into the skin once a week.  Dispense: 2 mL; Refill: 0  Leah Gilmore is currently in the action stage of change. As such, her goal is to continue her weight loss effortd. She has agreed to continue following the Low Carb plan.  Exercise goals: As is  Behavioral modification strategies: increasing lean protein, meal planning and cooking strategies, keeping healthy foods in the home, and planning for success.  Leah Gilmore has agreed to follow-up with our clinic in 2 weeks. She was informed of the importance of frequent follow-up visits to maximize her success with intensive lifestyle modifications for her multiple health conditions.   Objective:   VITALS: Per patient if applicable, see vitals. GENERAL: Alert and in no acute distress. CARDIOPULMONARY: No increased WOB. Speaking in clear sentences.  PSYCH: Pleasant and cooperative. Speech normal rate and rhythm. Affect is appropriate. Insight and judgement are appropriate. Attention is focused, linear, and appropriate.  NEURO: Oriented as arrived to appointment on time with no prompting.   Lab Results  Component Value Date   CREATININE 0.74 03/19/2020   BUN 12 03/19/2020   NA 139 03/19/2020   K 4.2 03/19/2020   CL 106 03/19/2020   CO2 25 03/19/2020   Lab Results  Component Value Date   ALT 11 03/19/2020   AST 19 03/19/2020   ALKPHOS 67 03/16/2018   BILITOT 1.1 03/19/2020   Lab Results  Component Value Date   HGBA1C 4.8 05/01/2019   HGBA1C 4.8 04/30/2019   No results found for: INSULIN Lab Results  Component Value Date   TSH 2.14 04/17/2019   Lab Results  Component Value Date   CHOL 168 04/17/2019   HDL 71 04/17/2019   LDLCALC 82 04/17/2019   TRIG 74 04/17/2019   CHOLHDL 2.4 04/17/2019   Lab Results  Component Value Date   WBC 5.9 03/19/2020   HGB 9.6 (L) 03/19/2020   HCT 30.1 (L) 03/19/2020   MCV 85.0 03/19/2020   PLT 346 03/19/2020   Lab Results  Component Value Date   IRON 24 (L) 03/19/2020    TIBC 404 03/19/2020   FERRITIN 5 (L) 03/19/2020    Attestation Statements:   Reviewed by clinician on day of visit: allergies, medications, problem list, medical history, surgical history, family history, social history, and previous encounter notes.  IKirke Corin, CMA, am acting as transcriptionist for Reuben Likes, MD  I have reviewed the above documentation for accuracy and completeness, and I agree with the above. - Katherina Mires, MD

## 2020-03-21 MED FILL — WEGOVY 1 MG/0.5ML SOAJ: 1 | 28 days supply | Qty: 2 | Fill #0

## 2020-03-25 ENCOUNTER — Other Ambulatory Visit: Payer: Self-pay

## 2020-03-25 ENCOUNTER — Telehealth (INDEPENDENT_AMBULATORY_CARE_PROVIDER_SITE_OTHER): Payer: No Typology Code available for payment source | Admitting: Family Medicine

## 2020-03-25 ENCOUNTER — Encounter: Payer: Self-pay | Admitting: Family Medicine

## 2020-03-25 ENCOUNTER — Encounter (INDEPENDENT_AMBULATORY_CARE_PROVIDER_SITE_OTHER): Payer: Self-pay | Admitting: Family Medicine

## 2020-03-25 DIAGNOSIS — D508 Other iron deficiency anemias: Secondary | ICD-10-CM

## 2020-03-25 DIAGNOSIS — E559 Vitamin D deficiency, unspecified: Secondary | ICD-10-CM

## 2020-03-25 DIAGNOSIS — E669 Obesity, unspecified: Secondary | ICD-10-CM | POA: Diagnosis not present

## 2020-03-25 DIAGNOSIS — Z6830 Body mass index (BMI) 30.0-30.9, adult: Secondary | ICD-10-CM | POA: Diagnosis not present

## 2020-03-26 ENCOUNTER — Encounter (INDEPENDENT_AMBULATORY_CARE_PROVIDER_SITE_OTHER): Payer: Self-pay | Admitting: Family Medicine

## 2020-03-26 NOTE — Telephone Encounter (Signed)
Please advise 

## 2020-03-27 NOTE — Progress Notes (Signed)
TeleHealth Visit:  Due to the COVID-19 pandemic, this visit was completed with telemedicine (audio/video) technology to reduce patient and provider exposure as well as to preserve personal protective equipment.   Leah Gilmore has verbally consented to this TeleHealth visit. The patient is located at home, the provider is located at the [facility] office. The participants in this visit include the listed provider and patient . The visit was conducted today via MyChart Video.  Chief Complaint: OBESITY Leah Gilmore is here to discuss her progress with her obesity treatment plan along with follow-up of her obesity related diagnoses. Leah Gilmore is on following a lower carbohydrate, vegetable and lean protein rich diet plan and states she is following her eating plan approximately 50% of the time. Leah Gilmore states she is working out with trainer 30 minutes 3 times per week.   Today's visit was #: 16 Starting weight: 209 lbs  Starting date: 04/30/2019  Interim History: Leah Gilmore's weight today is 186 (same as last appointment). Her daughter was negative for COVID 2 weeks ago. Leah Gilmore realizes she isn't 100% on meal plan, but doing the best she can. Next few weeks she has nothing viewed as an obstacle.   Subjective:   1.  Other iron deficiency anemia Last iron of 24 and Ferritin of 5. H/H was 9.6 and 30.1. Leah Gilmore's lab values are lower than they were 2 years ago when she required infusion.   CBC Latest Ref Rng & Units 03/19/2020 03/19/2020 10/11/2019  WBC 3.8 - 10.8 Thousand/uL 5.9 5.9 3.2(L)  Hemoglobin 11.7 - 15.5 g/dL 1.9(E) - 10.0(L)  Hematocrit 35.0 - 45.0 % 30.1(L) - 32.9(L)  Platelets 140 - 400 Thousand/uL 346 - 256   Lab Results  Component Value Date   IRON 24 (L) 03/19/2020   TIBC 404 03/19/2020   FERRITIN 5 (L) 03/19/2020   Lab Results  Component Value Date   VITAMINB12 815 03/19/2020    2.  Vitamin D deficiency Leah Gilmore denies nausea, vomiting, or muscle weakness, but notes fatigue. She is on prescription  Vitamin D.   Assessment/Plan:   1. Other iron deficiency anemia Behavior modification techniques were discussed today to help Leah Gilmore deal with her anxiety.  Orders and follow up as documented in patient record. Leah Gilmore will follow up with Dr Mardelle Matte for iron infusion set up.  2. Vitamin D deficiency Low Vitamin D level contributes to fatigue and are associated with obesity, breast, and colon cancer. She agrees to continue to take prescription Vitamin D @50 ,000 IU every week and will follow-up for routine testing of Vitamin D, at least 2-3 times per year to avoid over-replacement. Leah Gilmore will continue Vitamin D prescription. No refill needed.  3. Class 1 obesity with serious comorbidity and body mass index (BMI) of 30.0 to 30.9 in adult, unspecified obesity type  Leah Gilmore is currently in the action stage of change. As such, her goal is to continue with weight loss efforts. She has agreed to following a lower carbohydrate, vegetable and lean protein rich diet plan.   Leah Gilmore will continue Wegovy at current dose. No refill needed.  Exercise goals: No exercise has been prescribed at this time.  Behavioral modification strategies: increasing lean protein intake, meal planning and cooking strategies, keeping healthy foods in the home and planning for success.  Leah Gilmore has agreed to follow-up with our clinic in 2 weeks on 04/10/2020 at 10:20 am. She was informed of the importance of frequent follow-up visits to maximize her success with intensive lifestyle modifications for her  multiple health conditions.   Objective:   VITALS: Per patient if applicable, see vitals. GENERAL: Alert and in no acute distress. CARDIOPULMONARY: No increased WOB. Speaking in clear sentences.  PSYCH: Pleasant and cooperative. Speech normal rate and rhythm. Affect is appropriate. Insight and judgement are appropriate. Attention is focused, linear, and appropriate.  NEURO: Oriented as arrived to appointment on time with no prompting.   Lab  Results  Component Value Date   CREATININE 0.74 03/19/2020   BUN 12 03/19/2020   NA 139 03/19/2020   K 4.2 03/19/2020   CL 106 03/19/2020   CO2 25 03/19/2020   Lab Results  Component Value Date   ALT 11 03/19/2020   AST 19 03/19/2020   ALKPHOS 53 03/19/2020   BILITOT 1.1 03/19/2020   Lab Results  Component Value Date   HGBA1C 4.8 05/01/2019   HGBA1C 4.8 04/30/2019   No results found for: INSULIN Lab Results  Component Value Date   TSH 2.14 04/17/2019   Lab Results  Component Value Date   CHOL 168 04/17/2019   HDL 71 04/17/2019   LDLCALC 82 04/17/2019   TRIG 74 04/17/2019   CHOLHDL 2.4 04/17/2019   Lab Results  Component Value Date   WBC 5.9 03/19/2020   HGB 9.6 (L) 03/19/2020   HCT 30.1 (L) 03/19/2020   MCV 85.0 03/19/2020   PLT 346 03/19/2020   Lab Results  Component Value Date   IRON 24 (L) 03/19/2020   TIBC 404 03/19/2020   FERRITIN 5 (L) 03/19/2020    Attestation Statements:   Reviewed by clinician on day of visit: allergies, medications, problem list, medical history, surgical history, family history, social history, and previous encounter notes.    I, Delorse Limber, am acting as transcriptionist for Reuben Likes, MD.  I have reviewed the above documentation for accuracy and completeness, and I agree with the above. - Katherina Mires, MD

## 2020-04-02 NOTE — Telephone Encounter (Signed)
Please advise 

## 2020-04-10 ENCOUNTER — Encounter (INDEPENDENT_AMBULATORY_CARE_PROVIDER_SITE_OTHER): Payer: Self-pay | Admitting: Family Medicine

## 2020-04-10 ENCOUNTER — Telehealth (INDEPENDENT_AMBULATORY_CARE_PROVIDER_SITE_OTHER): Payer: No Typology Code available for payment source | Admitting: Family Medicine

## 2020-04-10 DIAGNOSIS — Z6830 Body mass index (BMI) 30.0-30.9, adult: Secondary | ICD-10-CM

## 2020-04-10 DIAGNOSIS — D508 Other iron deficiency anemias: Secondary | ICD-10-CM

## 2020-04-10 DIAGNOSIS — E8881 Metabolic syndrome: Secondary | ICD-10-CM | POA: Diagnosis not present

## 2020-04-10 DIAGNOSIS — E669 Obesity, unspecified: Secondary | ICD-10-CM | POA: Diagnosis not present

## 2020-04-10 MED ORDER — OZEMPIC (1 MG/DOSE) 4 MG/3ML ~~LOC~~ SOPN: 1.0000 mg | PEN_INJECTOR | SUBCUTANEOUS | 0 refills | Status: DC

## 2020-04-11 DIAGNOSIS — Z719 Counseling, unspecified: Secondary | ICD-10-CM | POA: Insufficient documentation

## 2020-04-15 NOTE — Progress Notes (Signed)
TeleHealth Visit:  Due to the COVID-19 pandemic, this visit was completed with telemedicine (audio/video) technology to reduce patient and provider exposure as well as to preserve personal protective equipment.   Leah Gilmore has verbally consented to this TeleHealth visit. The patient is located at home, the provider is located at the [facility] office. The participants in this visit include the listed provider and patient. The visit was conducted today via video.   Chief Complaint: OBESITY Leah Gilmore is here to discuss her progress with her obesity treatment plan along with follow-up of her obesity related diagnoses. Leah Gilmore is on following a lower carbohydrate, vegetable and lean protein rich diet plan and states she is following her eating plan approximately 80% of the time. Leah Gilmore states she is working with a trainer 30 minutes 3 times per week.  Today's visit was #: 17 Starting weight: 209 lbs Starting date: 04/30/2019  Interim History: Pt is feeling terrible- tired and exhausted. She is taking vitamin daily. She feels like she doesn't drink enough liquid and is wondering if she is dehydrated. She has been following lower carb plan with no difficulties.  Subjective:   1. Insulin resistance Pt reports carb cravings. She did well on GLP-1.  2. Other iron deficiency anemia Pt has a history of iron transfusion. Her hemoglobin was 9.6 and hematocrit 30.1 on 03/19/2020.   Assessment/Plan:   1. Insulin resistance Leah Gilmore will continue to work on weight loss, exercise, and decreasing simple carbohydrates to help decrease the risk of diabetes. Leah Gilmore agreed to follow-up with [name] as directed to closely monitor her progress. Switch to Leah Gilmore 1 mg, as per below.  - Semaglutide, 1 MG/DOSE, (Leah Gilmore, 1 MG/DOSE,) 4 MG/3ML SOPN; Inject 1 mg into the skin once a week.  Dispense: 3 mL; Refill: 0  2. Other iron deficiency anemia Iron transfusion.  - Ambulatory referral to Hematology  3. Class 1 obesity  with serious comorbidity and body mass index (BMI) of 30.0 to 30.9 in adult, unspecified obesity type Leah Gilmore is currently in the action stage of change. As such, her goal is to continue with weight loss efforts. She has agreed to following a lower carbohydrate, vegetable and lean protein rich diet plan.   Exercise goals: As is  Behavioral modification strategies: increasing lean protein intake, meal planning and cooking strategies, keeping healthy foods in the home and planning for success.  Leah Gilmore has agreed to follow-up with our clinic in 2 weeks. She was informed of the importance of frequent follow-up visits to maximize her success with intensive lifestyle modifications for her multiple health conditions.  Objective:   VITALS: Per patient if applicable, see vitals. GENERAL: Alert and in no acute distress. CARDIOPULMONARY: No increased WOB. Speaking in clear sentences.  PSYCH: Pleasant and cooperative. Speech normal rate and rhythm. Affect is appropriate. Insight and judgement are appropriate. Attention is focused, linear, and appropriate.  NEURO: Oriented as arrived to appointment on time with no prompting.   Lab Results  Component Value Date   CREATININE 0.74 03/19/2020   BUN 12 03/19/2020   NA 139 03/19/2020   K 4.2 03/19/2020   CL 106 03/19/2020   CO2 25 03/19/2020   Lab Results  Component Value Date   ALT 11 03/19/2020   AST 19 03/19/2020   ALKPHOS 53 03/19/2020   BILITOT 1.1 03/19/2020   Lab Results  Component Value Date   HGBA1C 4.8 05/01/2019   HGBA1C 4.8 04/30/2019   No results found for: INSULIN Lab Results  Component Value Date   TSH 2.14 04/17/2019   Lab Results  Component Value Date   CHOL 168 04/17/2019   HDL 71 04/17/2019   LDLCALC 82 04/17/2019   TRIG 74 04/17/2019   CHOLHDL 2.4 04/17/2019   Lab Results  Component Value Date   WBC 5.9 03/19/2020   HGB 9.6 (L) 03/19/2020   HCT 30.1 (L) 03/19/2020   MCV 85.0 03/19/2020   PLT 346 03/19/2020   Lab  Results  Component Value Date   IRON 24 (L) 03/19/2020   TIBC 404 03/19/2020   FERRITIN 5 (L) 03/19/2020    Attestation Statements:   Reviewed by clinician on day of visit: allergies, medications, problem list, medical history, surgical history, family history, social history, and previous encounter notes.  Edmund Hilda, am acting as transcriptionist for Reuben Likes, MD.   I have reviewed the above documentation for accuracy and completeness, and I agree with the above. - Katherina Mires, MD

## 2020-04-16 ENCOUNTER — Telehealth: Payer: Self-pay | Admitting: Hematology and Oncology

## 2020-04-16 NOTE — Telephone Encounter (Signed)
Scheduled per 2/8 staff msg. Called pt and left a msg

## 2020-04-17 ENCOUNTER — Encounter: Payer: Self-pay | Admitting: Family Medicine

## 2020-04-17 ENCOUNTER — Other Ambulatory Visit: Payer: Self-pay

## 2020-04-17 ENCOUNTER — Ambulatory Visit (INDEPENDENT_AMBULATORY_CARE_PROVIDER_SITE_OTHER): Payer: No Typology Code available for payment source | Admitting: Family Medicine

## 2020-04-17 ENCOUNTER — Other Ambulatory Visit (HOSPITAL_COMMUNITY)
Admission: RE | Admit: 2020-04-17 | Discharge: 2020-04-17 | Disposition: A | Discharge status: Home / Self Care | Payer: No Typology Code available for payment source | Source: Ambulatory Visit | Attending: Family Medicine | Admitting: Family Medicine

## 2020-04-17 VITALS — BP 106/64 | HR 64 | Temp 98.2°F | Ht 67.0 in | Wt 181.2 lb

## 2020-04-17 DIAGNOSIS — Z0000 Encounter for general adult medical examination without abnormal findings: Secondary | ICD-10-CM | POA: Diagnosis present

## 2020-04-17 DIAGNOSIS — Z124 Encounter for screening for malignant neoplasm of cervix: Secondary | ICD-10-CM | POA: Diagnosis present

## 2020-04-17 DIAGNOSIS — N920 Excessive and frequent menstruation with regular cycle: Secondary | ICD-10-CM

## 2020-04-17 DIAGNOSIS — D508 Other iron deficiency anemias: Secondary | ICD-10-CM

## 2020-04-17 DIAGNOSIS — Z9884 Bariatric surgery status: Secondary | ICD-10-CM

## 2020-04-17 DIAGNOSIS — E282 Polycystic ovarian syndrome: Secondary | ICD-10-CM

## 2020-04-17 NOTE — Progress Notes (Signed)
Subjective  Chief Complaint  Patient presents with  . Annual Exam    Fasting   . Medication Problem    Patient states that when she takes her Iron pill it makes her sick to her stomach wants to discuss other options.     HPI: Leah Gilmore is a 35 y.o. female who presents to [facility] [place] today for a Female Wellness Visit.  She also has the concerns and/or needs as listed above in the chief complaint. These will be addressed in addition to the Health Maintenance Visit.   Wellness Visit: annual visit with health maintenance review and exam with Pap   Health maintenance: Pap smear due today.  Doing well with obesity management: Seeing healthy weight and wellness.  All records reviewed including most recent lab work from January.  Reviewed plastic surgery consultation.  Unfortunately had an panniculectomy was declined. Chronic disease management visit and/or acute problem visit:  Menorrhagia, PCOS and chronic iron deficiency anemia, multifactorial: We discussed again her heavy menstrual cycles.  She is passing clots.  She has been on birth control in the past and had nausea.  She prefers not to use hormonal methods.  We could discuss IUD placement.  She is averse to this as well.  Given her chronic anemia due to chronic blood loss and poor iron absorption since status post gastric bypass, recommend referral to GYN to consider ablation therapy.  Referral placed.  Status post gastric bypass with vitamin deficiencies and iron deficiencies: Continue replacements.  Most recent labs are improved except severe iron deficiency.  Hemoglobin is mildly worse.  She has been referred to hematology for iron infusion.  Lab Results  Component Value Date   CREATININE 0.74 03/19/2020   BUN 12 03/19/2020   NA 139 03/19/2020   K 4.2 03/19/2020   CL 106 03/19/2020   CO2 25 03/19/2020   Lab Results  Component Value Date   ALT 11 03/19/2020   AST 19 03/19/2020   ALKPHOS 53  03/19/2020   BILITOT 1.1 03/19/2020   Lab Results  Component Value Date   FERRITIN 5 (L) 03/19/2020   Lab Results  Component Value Date   IRON 24 (L) 03/19/2020   TIBC 404 03/19/2020   FERRITIN 5 (L) 03/19/2020    Lab Results  Component Value Date   CHOL 168 04/17/2019   HDL 71 04/17/2019   LDLCALC 82 04/17/2019   TRIG 74 04/17/2019   CHOLHDL 2.4 04/17/2019   Lab Results  Component Value Date   WBC 5.9 03/19/2020   HGB 9.6 (L) 03/19/2020   HCT 30.1 (L) 03/19/2020   MCV 85.0 03/19/2020   PLT 346 03/19/2020    Assessment     1. Annual physical exam   2. Cervical cancer screening   3. PCOS (polycystic ovarian syndrome)   4. S/P gastric bypass   5. Other iron deficiency anemia   6. Menorrhagia with regular cycle      Plan  Female Wellness Visit:  Age appropriate Health Maintenance and Prevention measures were discussed with patient. Included topics are cancer screening recommendations, ways to keep healthy (see AVS) including dietary and exercise recommendations, regular eye and dental care, use of seat belts, and avoidance of moderate alcohol use and tobacco use.  Pap smear with HPV testing done today.  BMI: discussed patient's BMI and encouraged positive lifestyle modifications to help get to or maintain a target BMI.  HM needs and immunizations were addressed and ordered. See  below for orders. See HM and immunization section for updates.  Up-to-date  Discussed recommendations regarding Vit D and calcium supplementation (see AVS)  Chronic disease f/u and/or acute problem visit: (deemed necessary to be done in addition to the wellness visit):  PCOS and heavy menstrual cycles: Averse to hormonal manipulation, birth control: Due to chronic anemia and poor iron absorption since gastric bypass refer to gynecology.  Iron deficiency anemia: Switch to over-the-counter iron twice daily if tolerated.  Will likely get iron infusion with hematology soon.  She reports she  has sickle cell trait negative.  Weight loss: Has done very well.  Highest weight was 328.  She continues to eat well and exercise.    Follow up: Return in about 1 year (around 04/17/2021) for complete physical.   Orders Placed This Encounter  Procedures  . Ambulatory referral to Obstetrics / Gynecology   No orders of the defined types were placed in this encounter.      Body mass index is 28.38 kg/m. Wt Readings from Last 3 Encounters:  04/17/20 181 lb 3.2 oz (82.2 kg)  02/13/20 191 lb (86.6 kg)  01/10/20 200 lb (90.7 kg)     Patient Active Problem List   Diagnosis Date Noted  . Encounter for consultation 04/11/2020  . Iron deficiency anemia 03/11/2017  . PCOS (polycystic ovarian syndrome) 03/29/2016  . 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 Hepatitis C Screening  Never done  . PAP SMEAR-Modifier  03/11/2020  . TETANUS/TDAP  10/11/2023  . INFLUENZA VACCINE  Completed  . COVID-19 Vaccine  Completed  . HIV Screening  Completed   Immunization History  Administered Date(s) Administered  . Influenza, Seasonal, Injecte, Preservative Fre 11/20/2014  . Influenza,inj,Quad PF,6+ Mos 10/31/2016, 12/16/2017  . Influenza-Unspecified 11/29/2016, 12/14/2019  . PFIZER(Purple Top)SARS-COV-2 Vaccination 05/11/2019, 06/01/2019, 12/14/2019  . Tdap 10/10/2013   We updated and reviewed the patient's past history in detail and it is documented below. Allergies: Patient  reports no history of alcohol use. Past Medical History Patient  has a past medical history of Anemia, Iron deficiency anemia (03/11/2017), Multiple food allergies, PCOS (polycystic ovarian syndrome) (03/29/2016), S/P gastric bypass (03/29/2016), and Vitamin D deficiency. 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 Social History   Socioeconomic History  . Marital status: Married    Spouse  name: Ernestine Conrad  . Number of children: 2  . Years of education: Not on file  . Highest education level: Not on file  Occupational History  . Occupation: Hydrologist, housing loans    Employer: [employer]  Tobacco Use  . Smoking status: Never Smoker  . Smokeless tobacco: Never Used  Vaping Use  . Vaping Use: Never used  Substance and Sexual Activity  . Alcohol use: No    Alcohol/week: 0.0 standard drinks    Comment: rarely  . Drug use: No  . Sexual activity: Yes    Birth control/protection: None    Comment: same sex couple  Other Topics Concern  . Not on file  Social History Narrative  . Not on file   Social Determinants of Health   Financial Resource Strain: Not on file  Food Insecurity: Not on file  Transportation Needs: Not on file  Physical Activity: Not on file  Stress: Not on file  Social Connections: Not on file   Family History  Problem Relation Age of Onset  .  Thyroid disease Mother   . Depression Mother   . Anxiety disorder Mother   . Obesity Mother   . Cancer Father 13       lung cancer, smoker  . Alcohol abuse Father   . Drug abuse Father   . Liver disease Father   . Diabetes Maternal Grandmother   . Cancer Maternal Grandfather        prostate  . Diabetes Paternal Grandmother   . Hypertension Paternal Grandmother     Review of Systems: Constitutional: negative for fever or malaise Ophthalmic: negative for photophobia, double vision or loss of vision Cardiovascular: negative for chest pain, dyspnea on exertion, or new LE swelling Respiratory: negative for SOB or persistent cough Gastrointestinal: negative for abdominal pain, change in bowel habits or melena Genitourinary: negative for dysuria or gross hematuria, no abnormal uterine bleeding or disharge Musculoskeletal: negative for new gait disturbance or muscular weakness Integumentary: negative for new or persistent rashes, no breast lumps Neurological: negative for TIA or  stroke symptoms Psychiatric: negative for SI or delusions Allergic/Immunologic: negative for hives  Patient Care Team    Relationship Specialty Notifications Start End  [name], MD PCP - General Family Medicine  03/23/16   Serena Croissant, MD Consulting Physician Hematology and Oncology  03/20/18   Peggye Form, DO Attending Physician Plastic Surgery  04/17/19     Objective  Vitals: BP 106/64   Pulse 64   Temp 98.2 F (36.8 C) (Temporal)   Ht 5\' 7"  (1.702 m)   Wt 181 lb 3.2 oz (82.2 kg)   LMP 04/12/2020   SpO2 99%   BMI 28.38 kg/m  General:  Well developed, well nourished, no acute distress  Psych:  Alert and orientedx3,normal mood and affect HEENT:  Normocephalic, atraumatic, non-icteric sclera, PERRL, supple neck without adenopathy, mass or thyromegaly Cardiovascular:  Normal S1, S2, RRR without gallop, rub or murmur Respiratory:  Good breath sounds bilaterally, CTAB with normal respiratory effort Gastrointestinal: normal bowel sounds, soft, non-tender, no noted masses. No HSM MSK: no deformities, contusions. Joints are without erythema or swelling.  Skin:  Warm, no rashes or suspicious lesions noted Neurologic:    Mental status is normal. Gross motor and sensory exams are normal. Normal gait. No tremor Breast Exam: No mass, skin retraction or nipple discharge is appreciated in either breast. No axillary adenopathy. Fibrocystic changes are not noted Pelvic Exam: Normal external genitalia, no vulvar or vaginal lesions present. Clear nulliparous cervix w/o CMT. Bimanual exam reveals a nontender fundus w/o masses, nl size. No adnexal masses present. No inguinal adenopathy. A PAP smear was performed.     Commons side effects, risks, benefits, and alternatives for medications and treatment plan prescribed today were discussed, and the patient expressed understanding of the given instructions. Patient is instructed to call or message via MyChart if he/she has any questions  or concerns regarding our treatment plan. No barriers to understanding were identified. We discussed Red Flag symptoms and signs in detail. Patient expressed understanding regarding what to do in case of urgent or emergency type symptoms.   Medication list was reconciled, printed and provided to the patient in AVS. Patient instructions and summary information was reviewed with the patient as documented in the AVS. This note was prepared with assistance of Dragon voice recognition software. Occasional wrong-word or sound-a-like substitutions may have occurred due to the inherent limitations of voice recognition software  This visit occurred during the SARS-CoV-2 public health emergency.  Safety protocols were in  place, including screening questions prior to the visit, additional usage of staff PPE, and extensive cleaning of exam room while observing appropriate contact time as indicated for disinfecting solutions.

## 2020-04-17 NOTE — Patient Instructions (Addendum)
Please return in 12 months for your annual complete physical; please come fasting.  We will call you with information regarding your referral appointment. Dr. Tamera Punt to discuss ways to manage your heavy cycles.  If you do not hear from [name] within the next 2 weeks, please let me know. It can take 1-2 weeks to get appointments set up with the specialists.   Take OTC iron twice a day with meals. It can cause constipation.  I'll let you know your pap smear results by mychart.   If you have any questions or concerns, please don't hesitate to send me a message via MyChart or call the office at [phone]. Thank you for visiting with [name] today! It's our pleasure caring for you.  You are awesome and should never be nervous! :)

## 2020-04-22 LAB — CYTOLOGY - PAP
Comment: NEGATIVE
Diagnosis: NEGATIVE
High risk HPV: NEGATIVE

## 2020-04-24 ENCOUNTER — Telehealth (INDEPENDENT_AMBULATORY_CARE_PROVIDER_SITE_OTHER): Payer: No Typology Code available for payment source | Admitting: Family Medicine

## 2020-04-24 ENCOUNTER — Other Ambulatory Visit: Payer: Self-pay

## 2020-04-24 ENCOUNTER — Encounter (INDEPENDENT_AMBULATORY_CARE_PROVIDER_SITE_OTHER): Payer: Self-pay | Admitting: Family Medicine

## 2020-04-24 DIAGNOSIS — E559 Vitamin D deficiency, unspecified: Secondary | ICD-10-CM | POA: Diagnosis not present

## 2020-04-24 DIAGNOSIS — Z6830 Body mass index (BMI) 30.0-30.9, adult: Secondary | ICD-10-CM | POA: Diagnosis not present

## 2020-04-24 DIAGNOSIS — D508 Other iron deficiency anemias: Secondary | ICD-10-CM | POA: Diagnosis not present

## 2020-04-24 DIAGNOSIS — E669 Obesity, unspecified: Secondary | ICD-10-CM

## 2020-04-24 NOTE — Progress Notes (Signed)
Patient Care Team: [name], MD as PCP - General (Family Medicine) Serena Croissant, MD as Consulting Physician (Hematology and Oncology) Dillingham, Alena Bills, DO as Attending Physician (Plastic Surgery)  DIAGNOSIS:    ICD-10-CM   1. Iron deficiency anemia, unspecified iron deficiency anemia type  D50.9 CBC with Differential (Cancer Center Only)    Ferritin    Iron and TIBC    Vitamin B12    CHIEF COMPLIANT: Iron deficiency anemia  INTERVAL HISTORY: Leah Gilmore is a 35 y.o. with above-mentioned history of iron deficiency anemia due to heavy menstrual cycles. Labs on 03/19/20 showed Hg 9.6, HCT 30.1, iron saturation 6%, ferritin 5, B-12 815, folate 11.7. She presents to the clinic today for follow-up.  She came in today complaining of worsening fatigue craving for ice chips and feeling very tired.  She is used to being highly functional but lately she has not been able to function well and she is sleeping a lot.  The symptoms have been progressively getting worse since September.  ALLERGIES:  is allergic to azithromycin, food, grapefruit extract, and kiwi extract.  MEDICATIONS:  Current Outpatient Medications  Medication Sig Dispense Refill   Calcium 500 MG tablet Take 500 mg by mouth 3 (three) times daily.     Multiple Vitamins-Iron (MULTIVITAMIN/IRON PO) Take 1 tablet by mouth daily.     Semaglutide, 1 MG/DOSE, (OZEMPIC, 1 MG/DOSE,) 4 MG/3ML SOPN Inject 1 mg into the skin once a week. 3 mL 0   Vitamin D, Cholecalciferol, 50 MCG (2000 UT) CAPS Take 2,000 Units by mouth.     Vitamin D, Ergocalciferol, (DRISDOL) 1.25 MG (50000 UNIT) CAPS capsule Take 1 capsule (50,000 Units total) by mouth once a week. 4 capsule 0   No current facility-administered medications for this visit.    PHYSICAL EXAMINATION: ECOG PERFORMANCE STATUS: 1 - Symptomatic but completely ambulatory  Vitals:   04/25/20 0903  BP: 105/74  Pulse: 70  Resp: 20  Temp: 97.7 F (36.5 C)  SpO2:  100%   Filed Weights   04/25/20 0903  Weight: 179 lb 12.8 oz (81.6 kg)    LABORATORY DATA:  I have reviewed the data as listed CMP Latest Ref Rng & Units 03/19/2020 03/19/2020 04/17/2019  Glucose 65 - 139 mg/dL 86 - 84  BUN 7 - 25 mg/dL 12 - 17  Creatinine 9.38 - 1.10 mg/dL 1.01 - 7.51  Sodium 025 - 146 mmol/L 139 139 140  Potassium 3.5 - 5.3 mmol/L 4.2 - 4.3  Chloride 98 - 110 mmol/L 106 - 105  CO2 20 - 32 mmol/L 25 - 24  Calcium 8.6 - 10.2 mg/dL 9.7 - 9.5  Total Protein 6.1 - 8.1 g/dL 7.4 - 7.9  Total Bilirubin 0.2 - 1.2 mg/dL 1.1 - 1.1  Alkaline Phos 25 - 125 - 53 -  AST 10 - 30 U/L 19 19 26   ALT 6 - 29 U/L 11 - 38(H)    Lab Results  Component Value Date   WBC 5.9 03/19/2020   HGB 9.6 (L) 03/19/2020   HCT 30.1 (L) 03/19/2020   MCV 85.0 03/19/2020   PLT 346 03/19/2020   NEUTROABS 1,724 04/17/2019    ASSESSMENT & PLAN:  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  Recheck iron levels and CBC in 6 months and follow-up after that with a MyChart virtual visit.    Orders Placed This Encounter  Procedures   CBC with Differential (Cancer Center Only)    Standing Status:   Future    Standing Expiration Date:   04/25/2021   Ferritin    Standing Status:   Future    Standing Expiration Date:   04/25/2021   Iron and TIBC    Standing Status:   Future    Standing Expiration Date:   04/25/2021   Vitamin B12    Standing Status:   Future    Standing Expiration Date:   04/25/2021   The patient has a good understanding of the overall plan. she agrees with it. she will call with any problems that  may develop before the next visit here.  Total time spent: 30 mins including face to face time and time spent for planning, charting and coordination of care  Sabas Sous, MD, MPH 04/25/2020  I, Molly Dorshimer, am acting as scribe for Dr. Serena Croissant.  I have reviewed the above documentation for accuracy and completeness, and I agree with the above.

## 2020-04-25 ENCOUNTER — Other Ambulatory Visit: Payer: Self-pay

## 2020-04-25 ENCOUNTER — Encounter: Payer: Self-pay | Admitting: *Deleted

## 2020-04-25 ENCOUNTER — Inpatient Hospital Stay
Payer: No Typology Code available for payment source | Attending: Hematology and Oncology | Admitting: Hematology and Oncology

## 2020-04-25 ENCOUNTER — Telehealth: Payer: Self-pay | Admitting: Hematology and Oncology

## 2020-04-25 DIAGNOSIS — Z9884 Bariatric surgery status: Secondary | ICD-10-CM | POA: Diagnosis not present

## 2020-04-25 DIAGNOSIS — D509 Iron deficiency anemia, unspecified: Secondary | ICD-10-CM | POA: Insufficient documentation

## 2020-04-25 NOTE — Addendum Note (Signed)
Addended by: Serena Croissant on: 04/25/2020 02:13 PM   Modules accepted: Orders

## 2020-04-25 NOTE — Telephone Encounter (Signed)
Scheduled appts per /218 sch msg. Gave pt a print out of AVS.  

## 2020-04-25 NOTE — Progress Notes (Signed)
Per pt request, RN successfully faxed lab orders to [facility] at [phone].  RN also placed hard script in envelop at the front desk for pt to pick up.  Pt notified and verbalized understanding.

## 2020-04-25 NOTE — Assessment & Plan Note (Signed)
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  Recheck iron levels and CBC in 6 months and follow-up after that with a MyChart virtual visit.

## 2020-04-28 NOTE — Progress Notes (Unsigned)
TeleHealth Visit:  Due to the COVID-19 pandemic, this visit was completed with telemedicine (audio/video) technology to reduce patient and provider exposure as well as to preserve personal protective equipment.   Leah Gilmore has verbally consented to this TeleHealth visit. The patient is located at home, the provider is located at the [facility] office. The participants in this visit include the listed provider and patient. The visit was conducted today via video.   Chief Complaint: OBESITY Leah Gilmore is here to discuss her progress with her obesity treatment plan along with follow-up of her obesity related diagnoses. Leah Gilmore is on following a lower carbohydrate, vegetable and lean protein rich diet plan and states she is following her eating plan approximately 30% of the time. Leah Gilmore states she is working with a trainer 30 minutes 2-3 times per week.  Today's visit was #: 18 Starting weight: 209 lbs Starting date: 04/30/2019  Interim History: Pt is home with child with requires COVID testing again; awaiting results of confirmatory PCR tests. Pt reports a weight loss; weight of 180.4 lbs today. She finds low carb easy to follow and doesn't get her sick. She does have a question about when to bariatric multivitamin.  Subjective:   1. Other iron deficiency anemia Pt was referred to hematology/oncology. Her last iron level was 24 with low hemoglobin and hematocrit and MCV normal.  2. Vitamin D deficiency Pt denies nausea, vomiting, and muscle weakness but notes fatigue. Pt is on prescription Vit D.  Assessment/Plan:   1. Other iron deficiency anemia Follow up after she sees hematology. Switch over to [place] multivitamin.  2. Vitamin D deficiency Low Vitamin D level contributes to fatigue and are associated with obesity, breast, and colon cancer. She agrees to continue to take prescription Vitamin D @50 ,000 IU every week and will follow-up for routine testing of Vitamin D, at least 2-3  times per year to avoid over-replacement.  3. Class 1 obesity with serious comorbidity and body mass index (BMI) of 30.0 to 30.9 in adult, unspecified obesity type Leah Gilmore is currently in the action stage of change. As such, her goal is to continue with weight loss efforts. She has agreed to following a lower carbohydrate, vegetable and lean protein rich diet plan.   Exercise goals: As is  Behavioral modification strategies: increasing lean protein intake, meal planning and cooking strategies, keeping healthy foods in the home and planning for success.  Leah Gilmore has agreed to follow-up with our clinic in 2 weeks. She was informed of the importance of frequent follow-up visits to maximize her success with intensive lifestyle modifications for her multiple health conditions.  Objective:   VITALS: Per patient if applicable, see vitals. GENERAL: Alert and in no acute distress. CARDIOPULMONARY: No increased WOB. Speaking in clear sentences.  PSYCH: Pleasant and cooperative. Speech normal rate and rhythm. Affect is appropriate. Insight and judgement are appropriate. Attention is focused, linear, and appropriate.  NEURO: Oriented as arrived to appointment on time with no prompting.   Lab Results  Component Value Date   CREATININE 0.74 03/19/2020   BUN 12 03/19/2020   NA 139 03/19/2020   K 4.2 03/19/2020   CL 106 03/19/2020   CO2 25 03/19/2020   Lab Results  Component Value Date   ALT 11 03/19/2020   AST 19 03/19/2020   ALKPHOS 53 03/19/2020   BILITOT 1.1 03/19/2020   Lab Results  Component Value Date   HGBA1C 4.8 05/01/2019   HGBA1C 4.8 04/30/2019   No results  found for: INSULIN Lab Results  Component Value Date   TSH 2.14 04/17/2019   Lab Results  Component Value Date   CHOL 168 04/17/2019   HDL 71 04/17/2019   LDLCALC 82 04/17/2019   TRIG 74 04/17/2019   CHOLHDL 2.4 04/17/2019   Lab Results  Component Value Date   WBC 5.9 03/19/2020   HGB 9.6 (L) 03/19/2020   HCT 30.1 (L)  03/19/2020   MCV 85.0 03/19/2020   PLT 346 03/19/2020   Lab Results  Component Value Date   IRON 24 (L) 03/19/2020   TIBC 404 03/19/2020   FERRITIN 5 (L) 03/19/2020    Attestation Statements:   Reviewed by clinician on day of visit: allergies, medications, problem list, medical history, surgical history, family history, social history, and previous encounter notes.  Edmund Hilda, am acting as transcriptionist for Reuben Likes, MD.   I have reviewed the above documentation for accuracy and completeness, and I agree with the above. - Katherina Mires, MD

## 2020-05-01 ENCOUNTER — Other Ambulatory Visit: Payer: Self-pay

## 2020-05-01 ENCOUNTER — Telehealth: Payer: Self-pay | Admitting: Oncology

## 2020-05-01 ENCOUNTER — Inpatient Hospital Stay: Payer: No Typology Code available for payment source

## 2020-05-01 VITALS — BP 106/78 | HR 62 | Temp 98.6°F | Resp 18

## 2020-05-01 DIAGNOSIS — D500 Iron deficiency anemia secondary to blood loss (chronic): Secondary | ICD-10-CM

## 2020-05-01 DIAGNOSIS — D509 Iron deficiency anemia, unspecified: Secondary | ICD-10-CM | POA: Diagnosis not present

## 2020-05-01 MED ORDER — ALTEPLASE 2 MG IJ SOLR
2.0000 mg | Freq: Once | INTRAMUSCULAR | Status: DC | PRN
  Filled 2020-05-01: qty 2

## 2020-05-01 MED ORDER — SODIUM CHLORIDE 0.9 % IV SOLN
300.0000 mg | Freq: Once | INTRAVENOUS | Status: AC
  Administered 2020-05-01: 300 mg via INTRAVENOUS
  Filled 2020-05-01: qty 10

## 2020-05-01 MED ORDER — HEPARIN SOD (PORK) LOCK FLUSH 100 UNIT/ML IV SOLN
250.0000 [IU] | Freq: Once | INTRAVENOUS | Status: DC | PRN
  Filled 2020-05-01: qty 5

## 2020-05-01 MED ORDER — SODIUM CHLORIDE 0.9% FLUSH
3.0000 mL | Freq: Once | INTRAVENOUS | Status: DC | PRN
  Filled 2020-05-01: qty 10

## 2020-05-01 MED ORDER — SODIUM CHLORIDE 0.9 % IV SOLN
Freq: Once | INTRAVENOUS | Status: AC
  Filled 2020-05-01: qty 250

## 2020-05-01 MED ORDER — HEPARIN SOD (PORK) LOCK FLUSH 100 UNIT/ML IV SOLN
500.0000 [IU] | Freq: Once | INTRAVENOUS | Status: DC | PRN
  Filled 2020-05-01: qty 5

## 2020-05-01 MED ORDER — SODIUM CHLORIDE 0.9% FLUSH
10.0000 mL | Freq: Once | INTRAVENOUS | Status: DC | PRN
  Filled 2020-05-01: qty 10

## 2020-05-01 NOTE — Patient Instructions (Signed)

## 2020-05-05 ENCOUNTER — Inpatient Hospital Stay: Payer: No Typology Code available for payment source

## 2020-05-08 ENCOUNTER — Other Ambulatory Visit: Payer: Self-pay

## 2020-05-08 ENCOUNTER — Telehealth: Payer: Self-pay | Admitting: Hematology and Oncology

## 2020-05-08 ENCOUNTER — Ambulatory Visit (INDEPENDENT_AMBULATORY_CARE_PROVIDER_SITE_OTHER): Payer: No Typology Code available for payment source | Admitting: Family Medicine

## 2020-05-08 ENCOUNTER — Encounter (INDEPENDENT_AMBULATORY_CARE_PROVIDER_SITE_OTHER): Payer: Self-pay | Admitting: Family Medicine

## 2020-05-08 ENCOUNTER — Inpatient Hospital Stay: Payer: No Typology Code available for payment source | Attending: Hematology and Oncology

## 2020-05-08 VITALS — BP 98/56 | HR 74 | Temp 99.0°F | Ht 67.0 in | Wt 174.0 lb

## 2020-05-08 VITALS — BP 115/72 | HR 61 | Temp 98.9°F | Resp 18

## 2020-05-08 DIAGNOSIS — Z6830 Body mass index (BMI) 30.0-30.9, adult: Secondary | ICD-10-CM

## 2020-05-08 DIAGNOSIS — E8881 Metabolic syndrome: Secondary | ICD-10-CM | POA: Diagnosis not present

## 2020-05-08 DIAGNOSIS — E669 Obesity, unspecified: Secondary | ICD-10-CM

## 2020-05-08 DIAGNOSIS — Z9189 Other specified personal risk factors, not elsewhere classified: Secondary | ICD-10-CM

## 2020-05-08 DIAGNOSIS — D509 Iron deficiency anemia, unspecified: Secondary | ICD-10-CM | POA: Diagnosis not present

## 2020-05-08 DIAGNOSIS — D508 Other iron deficiency anemias: Secondary | ICD-10-CM | POA: Diagnosis not present

## 2020-05-08 DIAGNOSIS — D500 Iron deficiency anemia secondary to blood loss (chronic): Secondary | ICD-10-CM

## 2020-05-08 MED ORDER — SODIUM CHLORIDE 0.9 % IV SOLN
Freq: Once | INTRAVENOUS | Status: AC
  Filled 2020-05-08: qty 250

## 2020-05-08 MED ORDER — SODIUM CHLORIDE 0.9 % IV SOLN
300.0000 mg | Freq: Once | INTRAVENOUS | Status: AC
  Administered 2020-05-08: 300 mg via INTRAVENOUS
  Filled 2020-05-08: qty 5

## 2020-05-08 MED ORDER — OZEMPIC (1 MG/DOSE) 4 MG/3ML ~~LOC~~ SOPN: 1.0000 mg | PEN_INJECTOR | SUBCUTANEOUS | 0 refills | Status: DC

## 2020-05-08 NOTE — Patient Instructions (Signed)

## 2020-05-08 NOTE — Telephone Encounter (Signed)
Scheduled per 3/3 secure chat. Called pt and left a msg

## 2020-05-12 ENCOUNTER — Ambulatory Visit: Payer: No Typology Code available for payment source

## 2020-05-12 NOTE — Progress Notes (Signed)
Chief Complaint:   OBESITY Leah Gilmore is here to discuss her progress with her obesity treatment plan along with follow-up of her obesity related diagnoses. Leah Gilmore is on following a lower carbohydrate, vegetable and lean protein rich diet plan and states she is following her eating plan approximately 85% of the time. Leah Gilmore states she is working out with a trainer 30 minutes 3 times per week.  Today's visit was #: 19 Starting weight: 209 lbs Starting date: 04/30/2019 Today's weight: 174 lbs Today's date: 05/08/2020 Total lbs lost to date: 35 lbs Total lbs lost since last in-office visit: 17 lbs  Interim History: Pt's kids are well now. She voices that low carb plan is fairly easy to follow. The hardest part is eating more frequently throughout the day. Her sugar cravings significantly increase the week before her cycle. Cravings have been increased in the past few weeks.  Subjective:   1. Insulin resistance Pt is doing well on Ozempic 1 mg. She denies GI side effects.  2. Other iron deficiency anemia Pt received her 2nd infusion today. She is seeing Dr. Pamelia Hoit at Oncology/Hematology.  3. At risk for diabetes mellitus Leah Gilmore is at higher than average risk for developing diabetes due to obesity.   Assessment/Plan:   1. Insulin resistance Leah Gilmore will continue to work on weight loss, exercise, and decreasing simple carbohydrates to help decrease the risk of diabetes. Leah Gilmore agreed to follow-up with [name] as directed to closely monitor her progress.  - Semaglutide, 1 MG/DOSE, (OZEMPIC, 1 MG/DOSE,) 4 MG/3ML SOPN; Inject 1 mg into the skin once a week.  Dispense: 3 mL; Refill: 0  2. Other iron deficiency anemia Follow up with Dr. Pamelia Hoit and for 3rd infusion at previously scheduled appointment.  3. At risk for diabetes mellitus Leah Gilmore was given approximately 15 minutes of diabetes education and counseling today. We discussed intensive lifestyle modifications today with an emphasis on weight loss as well as  increasing exercise and decreasing simple carbohydrates in her diet. We also reviewed medication options with an emphasis on risk versus benefit of those discussed.   Repetitive spaced learning was employed today to elicit superior memory formation and behavioral change.  4. Class 1 obesity with serious comorbidity and body mass index (BMI) of 30.0 to 30.9 in adult, unspecified obesity type Leah Gilmore is currently in the action stage of change. As such, her goal is to continue with weight loss efforts. She has agreed to following a lower carbohydrate, vegetable and lean protein rich diet plan.   Exercise goals: As is  Behavioral modification strategies: increasing lean protein intake, meal planning and cooking strategies and keeping healthy foods in the home.  Leah Gilmore has agreed to follow-up with our clinic in 3-4 weeks. She was informed of the importance of frequent follow-up visits to maximize her success with intensive lifestyle modifications for her multiple health conditions.   Objective:   Blood pressure (!) 98/56, pulse 74, temperature 99 F (37.2 C), temperature source Oral, height 5\' 7"  (1.702 m), weight 174 lb (78.9 kg), last menstrual period 04/12/2020, SpO2 98 %. Body mass index is 27.25 kg/m.  General: Cooperative, alert, well developed, in no acute distress. HEENT: Conjunctivae and lids unremarkable. Cardiovascular: Regular rhythm.  Lungs: Normal work of breathing. Neurologic: No focal deficits.   Lab Results  Component Value Date   CREATININE 0.74 03/19/2020   BUN 12 03/19/2020   NA 139 03/19/2020   K 4.2 03/19/2020   CL 106 03/19/2020   CO2 25 03/19/2020  Lab Results  Component Value Date   ALT 11 03/19/2020   AST 19 03/19/2020   ALKPHOS 53 03/19/2020   BILITOT 1.1 03/19/2020   Lab Results  Component Value Date   HGBA1C 4.8 05/01/2019   HGBA1C 4.8 04/30/2019   No results found for: INSULIN Lab Results  Component Value Date   TSH 2.14 04/17/2019   Lab Results   Component Value Date   CHOL 168 04/17/2019   HDL 71 04/17/2019   LDLCALC 82 04/17/2019   TRIG 74 04/17/2019   CHOLHDL 2.4 04/17/2019   Lab Results  Component Value Date   WBC 5.9 03/19/2020   HGB 9.6 (L) 03/19/2020   HCT 30.1 (L) 03/19/2020   MCV 85.0 03/19/2020   PLT 346 03/19/2020   Lab Results  Component Value Date   IRON 24 (L) 03/19/2020   TIBC 404 03/19/2020   FERRITIN 5 (L) 03/19/2020    Attestation Statements:   Reviewed by clinician on day of visit: allergies, medications, problem list, medical history, surgical history, family history, social history, and previous encounter notes.  Edmund Hilda, am acting as transcriptionist for Reuben Likes, MD.  I have reviewed the above documentation for accuracy and completeness, and I agree with the above. - Katherina Mires, MD

## 2020-05-16 ENCOUNTER — Inpatient Hospital Stay: Payer: No Typology Code available for payment source

## 2020-05-16 ENCOUNTER — Other Ambulatory Visit: Payer: Self-pay

## 2020-05-16 VITALS — BP 115/70 | HR 59 | Temp 98.5°F | Resp 17

## 2020-05-16 DIAGNOSIS — D509 Iron deficiency anemia, unspecified: Secondary | ICD-10-CM | POA: Diagnosis not present

## 2020-05-16 DIAGNOSIS — D500 Iron deficiency anemia secondary to blood loss (chronic): Secondary | ICD-10-CM

## 2020-05-16 MED ORDER — SODIUM CHLORIDE 0.9 % IV SOLN
300.0000 mg | Freq: Once | INTRAVENOUS | Status: AC
  Administered 2020-05-16: 300 mg via INTRAVENOUS
  Filled 2020-05-16: qty 5

## 2020-05-16 MED ORDER — SODIUM CHLORIDE 0.9 % IV SOLN
Freq: Once | INTRAVENOUS | Status: AC
  Filled 2020-05-16: qty 250

## 2020-05-16 NOTE — Patient Instructions (Signed)

## 2020-05-26 ENCOUNTER — Encounter: Payer: Self-pay | Admitting: Family Medicine

## 2020-05-28 ENCOUNTER — Other Ambulatory Visit: Payer: Self-pay

## 2020-05-28 ENCOUNTER — Encounter (INDEPENDENT_AMBULATORY_CARE_PROVIDER_SITE_OTHER): Payer: Self-pay | Admitting: Family Medicine

## 2020-05-28 ENCOUNTER — Ambulatory Visit (INDEPENDENT_AMBULATORY_CARE_PROVIDER_SITE_OTHER): Payer: No Typology Code available for payment source | Admitting: Family Medicine

## 2020-05-28 VITALS — BP 106/56 | HR 96 | Temp 98.7°F | Ht 67.0 in | Wt 174.0 lb

## 2020-05-28 DIAGNOSIS — E669 Obesity, unspecified: Secondary | ICD-10-CM | POA: Diagnosis not present

## 2020-05-28 DIAGNOSIS — R7401 Elevation of levels of liver transaminase levels: Secondary | ICD-10-CM | POA: Diagnosis not present

## 2020-05-28 DIAGNOSIS — Z9189 Other specified personal risk factors, not elsewhere classified: Secondary | ICD-10-CM

## 2020-05-28 DIAGNOSIS — E8881 Metabolic syndrome: Secondary | ICD-10-CM

## 2020-05-28 DIAGNOSIS — Z6830 Body mass index (BMI) 30.0-30.9, adult: Secondary | ICD-10-CM | POA: Diagnosis not present

## 2020-05-28 MED ORDER — OZEMPIC (1 MG/DOSE) 4 MG/3ML ~~LOC~~ SOPN: 1.0000 mg | PEN_INJECTOR | SUBCUTANEOUS | 0 refills | Status: DC

## 2020-06-02 ENCOUNTER — Encounter (INDEPENDENT_AMBULATORY_CARE_PROVIDER_SITE_OTHER): Payer: Self-pay

## 2020-06-02 NOTE — Progress Notes (Signed)
Chief Complaint:   OBESITY Leah Gilmore is here to discuss her progress with her obesity treatment plan along with follow-up of her obesity related diagnoses. Leah Gilmore is on following a lower carbohydrate, vegetable and lean protein rich diet plan and states she is following her eating plan approximately 85% of the time. Leah Gilmore states she is working with a trainer 30 minutes 3 times per week.  Today's visit was #: 20 Starting weight: 209 lbs Starting date: 04/30/2019 Today's weight: 174 lbs Today's date: 05/28/2020 Total lbs lost to date: 35 lbs Total lbs lost since last in-office visit: 0  Interim History: Leah Gilmore is following the low carb plan fairly strictly in terms of compliance. Her biggest obstacle is her menstrual cycle when she feels increase in hunger and cravings. Her only plans for the next few weeks is yard work and her birthday is in 2 weeks.  Subjective:   1. Insulin resistance Leah Gilmore's last A1c was 4.8. She is still needing some control for polyphagia, especially around menstrual cycle.   Lab Results  Component Value Date   HGBA1C 4.8 05/01/2019    2. Transaminitis Leah Gilmore's last ALT was 38 and AST 26. Her recent LFT's were within normal limits.  3. At risk for side effect of medication Leah Gilmore is at risk for side effect of medication due to increasing dose of Ozempic.  Assessment/Plan:   1. Insulin resistance Leah Gilmore will continue to work on weight loss, exercise, and decreasing simple carbohydrates to help decrease the risk of diabetes. Leah Gilmore agreed to follow-up with [name] as directed to closely monitor her progress. Increase Ozempic to 1 mg, as per below.   - Semaglutide, 1 MG/DOSE, (OZEMPIC, 1 MG/DOSE,) 4 MG/3ML SOPN; Inject 1 mg into the skin 2 (two) times a week.  Dispense: 6 mL; Refill: 0  2. Transaminitis Repeat labs in 3 months.  3. At risk for side effect of medication Leah Gilmore was given approximately 15 minutes of drug side effect counseling today.  We discussed side effect possibility  and risk versus benefits. Leah Gilmore agreed to the medication and will contact this office if these side effects are intolerable.  Repetitive spaced learning was employed today to elicit superior memory formation and behavioral change.  4. Class 1 obesity with serious comorbidity and body mass index (BMI) of 30.0 to 30.9 in adult, unspecified obesity type Leah Gilmore is currently in the action stage of change. As such, her goal is to continue with weight loss efforts. She has agreed to following a lower carbohydrate, vegetable and lean protein rich diet plan.   Exercise goals: As is  Behavioral modification strategies: increasing lean protein intake, meal planning and cooking strategies, keeping healthy foods in the home and planning for success.  Leah Gilmore has agreed to follow-up with our clinic in 3-4 weeks. She was informed of the importance of frequent follow-up visits to maximize her success with intensive lifestyle modifications for her multiple health conditions.   Objective:   Blood pressure (!) 106/56, pulse 96, temperature 98.7 F (37.1 C), temperature source Oral, height 5\' 7"  (1.702 m), weight 174 lb (78.9 kg), SpO2 100 %. Body mass index is 27.25 kg/m.  General: Cooperative, alert, well developed, in no acute distress. HEENT: Conjunctivae and lids unremarkable. Cardiovascular: Regular rhythm.  Lungs: Normal work of breathing. Neurologic: No focal deficits.   Lab Results  Component Value Date   CREATININE 0.74 03/19/2020   BUN 12 03/19/2020   NA 139 03/19/2020   K 4.2 03/19/2020   CL  106 03/19/2020   CO2 25 03/19/2020   Lab Results  Component Value Date   ALT 11 03/19/2020   AST 19 03/19/2020   ALKPHOS 53 03/19/2020   BILITOT 1.1 03/19/2020   Lab Results  Component Value Date   HGBA1C 4.8 05/01/2019   HGBA1C 4.8 04/30/2019   No results found for: INSULIN Lab Results  Component Value Date   TSH 2.14 04/17/2019   Lab Results  Component Value Date   CHOL 168 04/17/2019    HDL 71 04/17/2019   LDLCALC 82 04/17/2019   TRIG 74 04/17/2019   CHOLHDL 2.4 04/17/2019   Lab Results  Component Value Date   WBC 5.9 03/19/2020   HGB 9.6 (L) 03/19/2020   HCT 30.1 (L) 03/19/2020   MCV 85.0 03/19/2020   PLT 346 03/19/2020   Lab Results  Component Value Date   IRON 24 (L) 03/19/2020   TIBC 404 03/19/2020   FERRITIN 5 (L) 03/19/2020     Attestation Statements:   Reviewed by clinician on day of visit: allergies, medications, problem list, medical history, surgical history, family history, social history, and previous encounter notes.  Edmund Hilda, am acting as transcriptionist for Reuben Likes, MD.   I have reviewed the above documentation for accuracy and completeness, and I agree with the above. - Katherina Mires, MD

## 2020-06-05 ENCOUNTER — Ambulatory Visit: Payer: No Typology Code available for payment source

## 2020-06-16 ENCOUNTER — Telehealth (INDEPENDENT_AMBULATORY_CARE_PROVIDER_SITE_OTHER): Payer: Self-pay | Admitting: Emergency Medicine

## 2020-06-17 NOTE — Telephone Encounter (Signed)
PA initiated via covermymeds for Ozempic   (Key: [id number]) Ozempic (0.25 or 0.5 MG/DOSE) 2MG /1.5ML pen-injectors

## 2020-06-18 ENCOUNTER — Other Ambulatory Visit: Payer: Self-pay

## 2020-06-18 ENCOUNTER — Ambulatory Visit (INDEPENDENT_AMBULATORY_CARE_PROVIDER_SITE_OTHER): Payer: No Typology Code available for payment source | Admitting: Family Medicine

## 2020-06-18 ENCOUNTER — Encounter (INDEPENDENT_AMBULATORY_CARE_PROVIDER_SITE_OTHER): Payer: Self-pay | Admitting: Family Medicine

## 2020-06-18 VITALS — BP 98/64 | HR 73 | Temp 98.2°F | Ht 67.0 in | Wt 171.0 lb

## 2020-06-18 DIAGNOSIS — Z6834 Body mass index (BMI) 34.0-34.9, adult: Secondary | ICD-10-CM | POA: Diagnosis not present

## 2020-06-18 DIAGNOSIS — E8881 Metabolic syndrome: Secondary | ICD-10-CM

## 2020-06-18 DIAGNOSIS — D509 Iron deficiency anemia, unspecified: Secondary | ICD-10-CM | POA: Diagnosis not present

## 2020-06-18 DIAGNOSIS — E669 Obesity, unspecified: Secondary | ICD-10-CM | POA: Diagnosis not present

## 2020-06-23 NOTE — Progress Notes (Signed)
Chief Complaint:   OBESITY Leah Gilmore is here to discuss her progress with her obesity treatment plan along with follow-up of her obesity related diagnoses. Leah Gilmore is on following a lower carbohydrate, vegetable and lean protein rich diet plan and states she is following her eating plan approximately 85% of the time. Leah Gilmore states she is doing high intensity 30 minutes 3 times per week.  Today's visit was #: 21 Starting weight: 209 lbs Starting date: 04/30/2019 Today's weight: 171 lbs Today's date: 06/18/2020 Total lbs lost to date: 38 lbs Total lbs lost since last in-office visit: 3  Interim History: Leah Gilmore reports she is doing well. She feels like she is at a plateau. She is about to start increased dose of Ozempic next week. She is struggling to get enough water in the day despite trying marked water bottles/cups. She is unsure how much protein she is getting. Leah Gilmore is thinking about starting caloric count. Breakfast- chicken sausage/protein shake; Lunch- chicken gilled wrap or by itself; Dinner- varies, grab n go, usually.  Subjective:   1. Insulin resistance Leah Gilmore is working well towards lifestyle changes, weight loss, and physical activity as to reduce risk of diabetes. She is tolerating Ozempic well.  2. 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 Status post IV iron transfusion. Leah Gilmore reports fatigue has improved.  Assessment/Plan:   1. Insulin resistance Leah Gilmore will continue to work on weight loss, exercise, and decreasing simple carbohydrates to help decrease the risk of diabetes. Leah Gilmore agreed to follow-up with [name] as directed to closely monitor her progress. Increase Ozempic to 1 mg twice weekly as discussed 05/29/2020.  2. Iron deficiency anemia, unspecified iron deficiency anemia type Follow up with hematology in 08/2020 for repeat labs.  3. Class 1 obesity with serious comorbidity and body mass index (BMI) of 34.0 to 34.9 in adult,  unspecified obesity type Leah Gilmore is currently in the action stage of change. As such, her goal is to continue with weight loss efforts. She has agreed to following a lower carbohydrate, vegetable and lean protein rich diet plan.   Increase Ozempic 1 mg twice weekly as discussed 05/29/2020.  Exercise goals: As is  Behavioral modification strategies: increasing lean protein intake, increasing vegetables, increasing water intake and planning for success.  Leah Gilmore has agreed to follow-up with our clinic in 4-5 weeks. She was informed of the importance of frequent follow-up visits to maximize her success with intensive lifestyle modifications for her multiple health conditions.   Objective:   Blood pressure 98/64, pulse 73, temperature 98.2 F (36.8 C), height 5\' 7"  (1.702 m), weight 171 lb (77.6 kg), SpO2 96 %. Body mass index is 26.78 kg/m.  General: Cooperative, alert, well developed, in no acute distress. HEENT: Conjunctivae and lids unremarkable. Cardiovascular: Regular rhythm.  Lungs: Normal work of breathing. Neurologic: No focal deficits.   Lab Results  Component Value Date   CREATININE 0.74 03/19/2020   BUN 12 03/19/2020   NA 139 03/19/2020   K 4.2 03/19/2020   CL 106 03/19/2020   CO2 25 03/19/2020   Lab Results  Component Value Date   ALT 11 03/19/2020   AST 19 03/19/2020   ALKPHOS 53 03/19/2020   BILITOT 1.1 03/19/2020   Lab Results  Component Value Date   HGBA1C 4.8 05/01/2019   HGBA1C 4.8 04/30/2019   No results found for: INSULIN Lab Results  Component Value Date   TSH 2.14 04/17/2019   Lab Results  Component  Value Date   CHOL 168 04/17/2019   HDL 71 04/17/2019   LDLCALC 82 04/17/2019   TRIG 74 04/17/2019   CHOLHDL 2.4 04/17/2019   Lab Results  Component Value Date   WBC 5.9 03/19/2020   HGB 9.6 (L) 03/19/2020   HCT 30.1 (L) 03/19/2020   MCV 85.0 03/19/2020   PLT 346 03/19/2020   Lab Results  Component Value Date   IRON 24 (L) 03/19/2020   TIBC 404  03/19/2020   FERRITIN 5 (L) 03/19/2020     Attestation Statements:   Reviewed by clinician on day of visit: allergies, medications, problem list, medical history, surgical history, family history, social history, and previous encounter notes.  Edmund Hilda, am acting as transcriptionist for Reuben Likes, MD.   I have reviewed the above documentation for accuracy and completeness, and I agree with the above. - Katherina Mires, MD

## 2020-07-21 ENCOUNTER — Telehealth (INDEPENDENT_AMBULATORY_CARE_PROVIDER_SITE_OTHER): Payer: No Typology Code available for payment source | Admitting: Family Medicine

## 2020-07-21 ENCOUNTER — Other Ambulatory Visit: Payer: Self-pay

## 2020-07-21 ENCOUNTER — Encounter (INDEPENDENT_AMBULATORY_CARE_PROVIDER_SITE_OTHER): Payer: Self-pay | Admitting: Family Medicine

## 2020-07-21 ENCOUNTER — Telehealth (INDEPENDENT_AMBULATORY_CARE_PROVIDER_SITE_OTHER): Payer: Self-pay

## 2020-07-21 ENCOUNTER — Encounter (INDEPENDENT_AMBULATORY_CARE_PROVIDER_SITE_OTHER): Payer: Self-pay

## 2020-07-21 DIAGNOSIS — Z6834 Body mass index (BMI) 34.0-34.9, adult: Secondary | ICD-10-CM

## 2020-07-21 DIAGNOSIS — E8881 Metabolic syndrome: Secondary | ICD-10-CM

## 2020-07-21 DIAGNOSIS — E669 Obesity, unspecified: Secondary | ICD-10-CM

## 2020-07-21 DIAGNOSIS — E559 Vitamin D deficiency, unspecified: Secondary | ICD-10-CM

## 2020-07-21 MED ORDER — VITAMIN D (ERGOCALCIFEROL) 1.25 MG (50000 UNIT) PO CAPS
ORAL_CAPSULE | ORAL | 0 refills | Status: DC
Start: 2020-07-21 — End: 2020-09-22

## 2020-07-21 MED ORDER — OZEMPIC (1 MG/DOSE) 4 MG/3ML ~~LOC~~ SOPN: 1.0000 mg | PEN_INJECTOR | SUBCUTANEOUS | 0 refills | Status: DC

## 2020-07-21 NOTE — Telephone Encounter (Signed)
PA was initiated via [email or web address] for Ozempic.  Received notification from pt's insurance through [email or web address] stating that no PA was needed and that this issue has been resolved.  Arleen STALLINGS-MORGAN ([facility]) Rx #: [id number] Ozempic (1 MG/DOSE) 4MG pen-injectors   Form Ronny Bacon PA Form (2017 NCP[employer]) Created 8 hours ago Sent to Plan 1 hour ago Plan Response 21 minutes ago Submit Clinical Questions Determination N/A Message from Plan Your PA has been resolved, no additional PA is required. For further inquiries please contact the number on the back of the member prescription card. (Message [phone])

## 2020-07-22 NOTE — Progress Notes (Signed)
TeleHealth Visit:  Due to the COVID-19 pandemic, this visit was completed with telemedicine (audio/video) technology to reduce patient and provider exposure as well as to preserve personal protective equipment.   Leah Gilmore has verbally consented to this TeleHealth visit. The patient is located at home, the provider is located at the [facility] office. The participants in this visit include the listed provider and patient. The visit was conducted today via video.   Chief Complaint: OBESITY Leah Gilmore is here to discuss her progress with her obesity treatment plan along with follow-up of her obesity related diagnoses. Leah Gilmore is on following a lower carbohydrate, vegetable and lean protein rich diet plan and states she is following her eating plan approximately 65% of the time. Leah Gilmore states she is not currently exercising.  Today's visit was #: 22 Starting weight: 209 lbs Starting date: 04/30/2019  Interim History: Pt is not feeling well- having significant congestion leading to significant headache this AM. She has been very busy the last week with kids activities. Her life has been integrating back into the office sporadically. Food has been quite a bit of takeout last week. She is hoping to get back in control of cooking at home. Reports weight of 166.4 today.  Subjective:   1. Vitamin D deficiency Pt denies nausea, vomiting, and muscle weakness but notes fatigue. Pt is on prescription Vit D. Her last Vit D level was 37.   2. Insulin resistance Pt is on Ozempic 1 mg twice a week. She denies GI side effects.  Assessment/Plan:   1. Vitamin D deficiency Low Vitamin D level contributes to fatigue and are associated with obesity, breast, and colon cancer. She agrees to continue to take prescription Vitamin D @50 ,000 IU every week and will follow-up for routine testing of Vitamin D, at least 2-3 times per year to avoid over-replacement. - Vitamin D, Ergocalciferol, (DRISDOL) 1.25 MG (50000  UNIT) CAPS capsule; TAKE 1 CAPSULE (50,000 UNITS TOTAL) BY MOUTH ONCE A WEEK.  Dispense: 4 capsule; Refill: 0  2. Insulin resistance Leah Gilmore will continue to work on weight loss, exercise, and decreasing simple carbohydrates to help decrease the risk of diabetes. Leah Gilmore agreed to follow-up with Leah Gilmore as directed to closely monitor her progress. - Semaglutide, 1 MG/DOSE, (OZEMPIC, 1 MG/DOSE,) 4 MG/3ML SOPN; Inject 1 mg into the skin 2 (two) times a week.  Dispense: 6 mL; Refill: 0  3. Class 1 obesity with serious comorbidity and body mass index (BMI) of 34.0 to 34.9 in adult, unspecified obesity type  Leah Gilmore is currently in the action stage of change. As such, her goal is to continue with weight loss efforts. She has agreed to following a lower carbohydrate, vegetable and lean protein rich diet plan.   Exercise goals: All adults should avoid inactivity. Some physical activity is better than none, and adults who participate in any amount of physical activity gain some health benefits.  Behavioral modification strategies: increasing lean protein intake, decreasing eating out, meal planning and cooking strategies and planning for success.  Leah Gilmore has agreed to follow-up with our clinic in 3 weeks. She was informed of the importance of frequent follow-up visits to maximize her success with intensive lifestyle modifications for her multiple health conditions.  Objective:   VITALS: Per patient if applicable, see vitals. GENERAL: Alert and in no acute distress. CARDIOPULMONARY: No increased WOB. Speaking in clear sentences.  PSYCH: Pleasant and cooperative. Speech normal rate and rhythm. Affect is appropriate. Insight and judgement are appropriate. Attention  is focused, linear, and appropriate.  NEURO: Oriented as arrived to appointment on time with no prompting.   Lab Results  Component Value Date   CREATININE 0.74 03/19/2020   BUN 12 03/19/2020   NA 139 03/19/2020   K 4.2 03/19/2020   CL 106 03/19/2020    CO2 25 03/19/2020   Lab Results  Component Value Date   ALT 11 03/19/2020   AST 19 03/19/2020   ALKPHOS 53 03/19/2020   BILITOT 1.1 03/19/2020   Lab Results  Component Value Date   HGBA1C 4.8 05/01/2019   HGBA1C 4.8 04/30/2019   No results found for: INSULIN Lab Results  Component Value Date   TSH 2.14 04/17/2019   Lab Results  Component Value Date   CHOL 168 04/17/2019   HDL 71 04/17/2019   LDLCALC 82 04/17/2019   TRIG 74 04/17/2019   CHOLHDL 2.4 04/17/2019   Lab Results  Component Value Date   WBC 5.9 03/19/2020   HGB 9.6 (L) 03/19/2020   HCT 30.1 (L) 03/19/2020   MCV 85.0 03/19/2020   PLT 346 03/19/2020   Lab Results  Component Value Date   IRON 24 (L) 03/19/2020   TIBC 404 03/19/2020   FERRITIN 5 (L) 03/19/2020    Attestation Statements:   Reviewed by clinician on day of visit: allergies, medications, problem list, medical history, surgical history, family history, social history, and previous encounter notes.  Edmund Hilda, CMA, am acting as transcriptionist for Reuben Likes, MD.   I have reviewed the above documentation for accuracy and completeness, and I agree with the above. - Katherina Mires, MD

## 2020-07-30 ENCOUNTER — Encounter (INDEPENDENT_AMBULATORY_CARE_PROVIDER_SITE_OTHER): Payer: Self-pay | Admitting: Family Medicine

## 2020-08-18 ENCOUNTER — Inpatient Hospital Stay: Payer: No Typology Code available for payment source | Attending: Hematology and Oncology

## 2020-08-19 NOTE — Assessment & Plan Note (Deleted)
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  Return to clinic in 6 months with labs and follow-up

## 2020-08-20 ENCOUNTER — Inpatient Hospital Stay: Payer: No Typology Code available for payment source | Admitting: Hematology and Oncology

## 2020-08-25 ENCOUNTER — Ambulatory Visit (INDEPENDENT_AMBULATORY_CARE_PROVIDER_SITE_OTHER): Payer: No Typology Code available for payment source | Admitting: Family Medicine

## 2020-08-25 ENCOUNTER — Other Ambulatory Visit: Payer: Self-pay

## 2020-08-25 ENCOUNTER — Encounter (INDEPENDENT_AMBULATORY_CARE_PROVIDER_SITE_OTHER): Payer: Self-pay | Admitting: Family Medicine

## 2020-08-25 VITALS — BP 107/58 | HR 87 | Temp 98.3°F | Ht 67.0 in | Wt 159.0 lb

## 2020-08-25 DIAGNOSIS — E8881 Metabolic syndrome: Secondary | ICD-10-CM

## 2020-08-25 DIAGNOSIS — D509 Iron deficiency anemia, unspecified: Secondary | ICD-10-CM | POA: Diagnosis not present

## 2020-08-25 DIAGNOSIS — Z9189 Other specified personal risk factors, not elsewhere classified: Secondary | ICD-10-CM | POA: Diagnosis not present

## 2020-08-25 DIAGNOSIS — E669 Obesity, unspecified: Secondary | ICD-10-CM

## 2020-08-25 DIAGNOSIS — Z6834 Body mass index (BMI) 34.0-34.9, adult: Secondary | ICD-10-CM

## 2020-08-25 MED ORDER — SEMAGLUTIDE (2 MG/DOSE) 8 MG/3ML ~~LOC~~ SOPN: 2.0000 mg | PEN_INJECTOR | SUBCUTANEOUS | 0 refills | Status: DC

## 2020-08-26 NOTE — Progress Notes (Signed)
Chief Complaint:   OBESITY Leah Gilmore is here to discuss her progress with her obesity treatment plan along with follow-up of her obesity related diagnoses. Leah Gilmore is on following a lower carbohydrate, vegetable and lean protein rich diet plan and states she is following her eating plan approximately 50% of the time. Leah Gilmore states she is not currently exercising.  Today's visit was #: 23 Starting weight: 209 lbs Starting date: 04/30/2019 Today's weight: 159 lbs Today's date: 08/25/2020 Total lbs lost to date: 50 Total lbs lost since last in-office visit: 12  Interim History: [name] has been so busy for her and her family. She feels she has been constantly go, go, going. She is going to [facility] with her family in a few days. She is doing very well following low carb options.  Subjective:   1. Insulin resistance Leah Gilmore is doing well on Ozempic. She denies GI side effects.  2. Iron deficiency anemia, unspecified iron deficiency anemia type Leah Gilmore accidentally missed her hematology/oncology appt. S/p 3 iron infusions.  3. At risk for side effect of medication Lena is at risk for side effects of medication due to starting 2 mg Ozempic dose.  Assessment/Plan:   1. Insulin resistance Leah Gilmore will continue to work on weight loss, exercise, and decreasing simple carbohydrates to help decrease the risk of diabetes. Leah Gilmore agreed to follow-up with [name] as directed to closely monitor her progress.  - Semaglutide, 2 MG/DOSE, 8 MG/3ML SOPN; Inject 2 mg into the skin once a week.  Dispense: 3 mL; Refill: 0  2. Iron deficiency anemia, unspecified iron deficiency anemia type Check labs today.  - CBC with Differential/Platelet - Folate - Vitamin B12 - Ferritin - Iron Binding Cap (TIBC)([facility])  3. At risk for side effect of medication Leah Gilmore was given approximately 15 minutes of drug side effect counseling today.  We discussed side effect possibility and risk versus benefits. Leah Gilmore agreed to the medication  and will contact this office if these side effects are intolerable.  Repetitive spaced learning was employed today to elicit superior memory formation and behavioral change.   4. Class 1 obesity with serious comorbidity and body mass index (BMI) of 34.0 to 34.9 in adult, unspecified obesity type  Leah Gilmore is currently in the action stage of change. As such, her goal is to continue with weight loss efforts. She has agreed to following a lower carbohydrate, vegetable and lean protein rich diet plan.   Exercise goals: All adults should avoid inactivity. Some physical activity is better than none, and adults who participate in any amount of physical activity gain some health benefits.  Behavioral modification strategies: increasing lean protein intake, meal planning and cooking strategies, keeping healthy foods in the home, and planning for success.  Leah Gilmore has agreed to follow-up with our clinic in 4 weeks. She was informed of the importance of frequent follow-up visits to maximize her success with intensive lifestyle modifications for her multiple health conditions.   Leah Gilmore was informed we would discuss her lab results at her next visit unless there is a critical issue that needs to be addressed sooner. Leah Gilmore agreed to keep her next visit at the agreed upon time to discuss these results.  Objective:   Blood pressure (!) 107/58, pulse 87, temperature 98.3 F (36.8 C), height 5\' 7"  (1.702 m), weight 159 lb (72.1 kg), SpO2 99 %. Body mass index is 24.9 kg/m.  General: Cooperative, alert, well developed, in no acute distress. HEENT: Conjunctivae and lids unremarkable. Cardiovascular: Regular  rhythm.  Lungs: Normal work of breathing. Neurologic: No focal deficits.   Lab Results  Component Value Date   CREATININE 0.74 03/19/2020   BUN 12 03/19/2020   NA 139 03/19/2020   K 4.2 03/19/2020   CL 106 03/19/2020   CO2 25 03/19/2020   Lab Results  Component Value Date   ALT 11 03/19/2020   AST 19  03/19/2020   ALKPHOS 53 03/19/2020   BILITOT 1.1 03/19/2020   Lab Results  Component Value Date   HGBA1C 4.8 05/01/2019   HGBA1C 4.8 04/30/2019   No results found for: INSULIN Lab Results  Component Value Date   TSH 2.14 04/17/2019   Lab Results  Component Value Date   CHOL 168 04/17/2019   HDL 71 04/17/2019   LDLCALC 82 04/17/2019   TRIG 74 04/17/2019   CHOLHDL 2.4 04/17/2019   Lab Results  Component Value Date   WBC 5.9 03/19/2020   HGB 9.6 (L) 03/19/2020   HCT 30.1 (L) 03/19/2020   MCV 85.0 03/19/2020   PLT 346 03/19/2020   Lab Results  Component Value Date   IRON 24 (L) 03/19/2020   TIBC 404 03/19/2020   FERRITIN 5 (L) 03/19/2020    Attestation Statements:   Reviewed by clinician on day of visit: allergies, medications, problem list, medical history, surgical history, family history, social history, and previous encounter notes.  Edmund Hilda, CMA, am acting as transcriptionist for Reuben Likes, MD.   I have reviewed the above documentation for accuracy and completeness, and I agree with the above. - Katherina Mires, MD

## 2020-09-21 ENCOUNTER — Encounter (INDEPENDENT_AMBULATORY_CARE_PROVIDER_SITE_OTHER): Payer: Self-pay | Admitting: Family Medicine

## 2020-09-22 ENCOUNTER — Encounter (INDEPENDENT_AMBULATORY_CARE_PROVIDER_SITE_OTHER): Payer: Self-pay | Admitting: Family Medicine

## 2020-09-22 ENCOUNTER — Telehealth (INDEPENDENT_AMBULATORY_CARE_PROVIDER_SITE_OTHER): Payer: No Typology Code available for payment source | Admitting: Family Medicine

## 2020-09-22 ENCOUNTER — Other Ambulatory Visit: Payer: Self-pay

## 2020-09-22 DIAGNOSIS — E8881 Metabolic syndrome: Secondary | ICD-10-CM

## 2020-09-22 DIAGNOSIS — Z6834 Body mass index (BMI) 34.0-34.9, adult: Secondary | ICD-10-CM

## 2020-09-22 DIAGNOSIS — E559 Vitamin D deficiency, unspecified: Secondary | ICD-10-CM | POA: Diagnosis not present

## 2020-09-22 DIAGNOSIS — E669 Obesity, unspecified: Secondary | ICD-10-CM

## 2020-09-22 MED ORDER — SEMAGLUTIDE (2 MG/DOSE) 8 MG/3ML ~~LOC~~ SOPN: 2.0000 mg | PEN_INJECTOR | SUBCUTANEOUS | 0 refills | Status: DC

## 2020-09-22 MED ORDER — VITAMIN D (ERGOCALCIFEROL) 1.25 MG (50000 UNIT) PO CAPS
ORAL_CAPSULE | ORAL | 0 refills | Status: DC
Start: 2020-09-22 — End: 2020-12-01

## 2020-09-26 NOTE — Progress Notes (Signed)
TeleHealth Visit:  Due to the COVID-19 pandemic, this visit was completed with telemedicine (audio/video) technology to reduce patient and provider exposure as well as to preserve personal protective equipment.   Leah Gilmore has verbally consented to this TeleHealth visit. The patient is located at home, the provider is located at the [facility] office. The participants in this visit include the listed provider and patient. The visit was conducted today via telephone.  Leah Gilmore was unable to use realtime audiovisual technology today and the telehealth visit was conducted via telephone.  Chief Complaint: OBESITY Leah Gilmore is here to discuss her progress with her obesity treatment plan along with follow-up of her obesity related diagnoses. Leah Gilmore is on following a lower carbohydrate, vegetable and lean protein rich diet plan and states she is following her eating plan approximately 80% of the time. Leah Gilmore states she is doing [employer] 30 minutes 3 times per week.  Today's visit was #: 24 Starting weight: 209 lbs Starting date: 04/30/2019  Interim History: Leah Gilmore is home with her sick baby who has RSV. Pt is adhering to the low carb plan fairly strictly. She believes she maintained at about 157 lbs. She is making sure she gets protein in daily and is keeping carb intake controlled due to not feeling well if she eats too many carbs.  Subjective:   1. Insulin resistance Leah Gilmore is on Ozempic 2 mg and denies GI side effects. Her last A1c was 4.8.  2. Vitamin D deficiency She denies nausea, vomiting, and muscle weakness but notes fatigue. Pt is on prescription Vit D. Her last Vit D level was 37.  Assessment/Plan:   1. Insulin resistance Leah Gilmore will continue to work on weight loss, exercise, and decreasing simple carbohydrates to help decrease the risk of diabetes. Leah Gilmore agreed to follow-up with [name] as directed to closely monitor her progress.  Refill- Semaglutide, 2 MG/DOSE, 8 MG/3ML SOPN; Inject 2 mg into the  skin once a week.  Dispense: 3 mL; Refill: 0  2. Vitamin D deficiency Low Vitamin D level contributes to fatigue and are associated with obesity, breast, and colon cancer. She agrees to continue to take prescription Vitamin D @50 ,000 IU every week and will follow-up for routine testing of Vitamin D, at least 2-3 times per year to avoid over-replacement.  Refill- Vitamin D, Ergocalciferol, (DRISDOL) 1.25 MG (50000 UNIT) CAPS capsule; TAKE 1 CAPSULE (50,000 UNITS TOTAL) BY MOUTH ONCE A WEEK.  Dispense: 4 capsule; Refill: 0  3. Class 1 obesity with serious comorbidity and body mass index (BMI) of 34.0 to 34.9 in adult, unspecified obesity type  Leah Gilmore is currently in the action stage of change. As such, her goal is to continue with weight loss efforts. She has agreed to following a lower carbohydrate, vegetable and lean protein rich diet plan.   Exercise goals:  As is  Behavioral modification strategies: increasing lean protein intake, meal planning and cooking strategies, keeping healthy foods in the home, and planning for success.  Leah Gilmore has agreed to follow-up with our clinic in 2 weeks. She was informed of the importance of frequent follow-up visits to maximize her success with intensive lifestyle modifications for her multiple health conditions.  Objective:   VITALS: Per patient if applicable, see vitals. GENERAL: Alert and in no acute distress. CARDIOPULMONARY: No increased WOB. Speaking in clear sentences.  PSYCH: Pleasant and cooperative. Speech normal rate and rhythm. Affect is appropriate. Insight and judgement are appropriate. Attention is focused, linear, and appropriate.  NEURO:  Oriented as arrived to appointment on time with no prompting.   Lab Results  Component Value Date   CREATININE 0.74 03/19/2020   BUN 12 03/19/2020   NA 139 03/19/2020   K 4.2 03/19/2020   CL 106 03/19/2020   CO2 25 03/19/2020   Lab Results  Component Value Date   ALT 11 03/19/2020   AST 19 03/19/2020    ALKPHOS 53 03/19/2020   BILITOT 1.1 03/19/2020   Lab Results  Component Value Date   HGBA1C 4.8 05/01/2019   HGBA1C 4.8 04/30/2019   No results found for: INSULIN Lab Results  Component Value Date   TSH 2.14 04/17/2019   Lab Results  Component Value Date   CHOL 168 04/17/2019   HDL 71 04/17/2019   LDLCALC 82 04/17/2019   TRIG 74 04/17/2019   CHOLHDL 2.4 04/17/2019   Lab Results  Component Value Date   VD25OH 37 03/19/2020   VD25OH 18 (L) 04/17/2019   VD25OH 23.13 (L) 03/16/2018   Lab Results  Component Value Date   WBC 5.9 03/19/2020   HGB 9.6 (L) 03/19/2020   HCT 30.1 (L) 03/19/2020   MCV 85.0 03/19/2020   PLT 346 03/19/2020   Lab Results  Component Value Date   IRON 24 (L) 03/19/2020   TIBC 404 03/19/2020   FERRITIN 5 (L) 03/19/2020    Attestation Statements:   Reviewed by clinician on day of visit: allergies, medications, problem list, medical history, surgical history, family history, social history, and previous encounter notes.  Time spent on visit including pre-visit chart review and post-visit charting and care was 15 minutes.   Edmund Hilda, CMA, am acting as transcriptionist for Leah Likes, MD.  I have reviewed the above documentation for accuracy and completeness, and I agree with the above. - Leah Likes, MD

## 2020-10-06 ENCOUNTER — Ambulatory Visit (INDEPENDENT_AMBULATORY_CARE_PROVIDER_SITE_OTHER): Payer: Self-pay | Admitting: Family Medicine

## 2020-10-06 DIAGNOSIS — E669 Obesity, unspecified: Secondary | ICD-10-CM

## 2020-10-06 HISTORY — PX: ENDOMETRIAL ABLATION: SHX621

## 2020-10-19 NOTE — Progress Notes (Signed)
HEMATOLOGY-ONCOLOGY MYCHART VIDEO VISIT PROGRESS NOTE  I connected with [name] on 10/20/2020 at  8:45 AM EDT by MyChart video conference and verified that I am speaking with the correct person using two identifiers.  I discussed the limitations, risks, security and privacy concerns of performing an evaluation and management service by MyChart and the availability of in person appointments.  I also discussed with the patient that there may be a patient responsible charge related to this service. The patient expressed understanding and agreed to proceed.  Patient's Location: Home Physician Location: Clinic  CHIEF COMPLIANT: Follow-up for IDA  INTERVAL HISTORY: Leah Gilmore is a 35 y.o. female with above-mentioned history of  iron deficiency anemia due to heavy menstrual cycles. She presents over MyChart today for follow-up. She tells me that she had significant improvement in how she felt after the iron infusion but it starting to come back down because she continues to have heavy cycles.  Today she is scheduled to undergo an outpatient procedure to stop the heavy periods.  She is hoping that this is going to help resolve that issue.  She has orders for labs to be done for the iron levels.  She thinks she will do the iron level checked either this week Thursday or Friday.  Observations/Objective:  There were no vitals filed for this visit. There is no height or weight on file to calculate BMI.  I have reviewed the data as listed CMP Latest Ref Rng & Units 03/19/2020 03/19/2020 04/17/2019  Glucose 65 - 139 mg/dL 86 - 84  BUN 7 - 25 mg/dL 12 - 17  Creatinine 9.16 - 1.10 mg/dL 3.84 - 6.65  Sodium 993 - 146 mmol/L 139 139 140  Potassium 3.5 - 5.3 mmol/L 4.2 - 4.3  Chloride 98 - 110 mmol/L 106 - 105  CO2 20 - 32 mmol/L 25 - 24  Calcium 8.6 - 10.2 mg/dL 9.7 - 9.5  Total Protein 6.1 - 8.1 g/dL 7.4 - 7.9  Total Bilirubin 0.2 - 1.2 mg/dL 1.1 - 1.1  Alkaline Phos 25 - 125 - 53 -   AST 10 - 30 U/L 19 19 26   ALT 6 - 29 U/L 11 - 38(H)    Lab Results  Component Value Date   WBC 5.9 03/19/2020   HGB 9.6 (L) 03/19/2020   HCT 30.1 (L) 03/19/2020   MCV 85.0 03/19/2020   PLT 346 03/19/2020   NEUTROABS 1,724 04/17/2019      Assessment Plan:  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   Patient plans to do blood work this Thursday or Friday. I will call her next Monday to go over the results of iron studies.    I discussed the assessment and treatment plan with the patient. The patient was provided an opportunity to ask questions and all were answered. The patient agreed with the plan and demonstrated an understanding of the instructions. The patient was advised to call back or seek an in-person evaluation if the symptoms worsen or if the condition fails to improve as anticipated.   Total time spent: 20 minutes including face-to-face MyChart video visit time and time spent for  planning, charting and coordination of care  Sabas Sous, MD 10/20/2020  I, Alda Ponder am acting as scribe for Serena Croissant, MD.  I have reviewed the above documentation for accuracy and completeness, and I agree with the above.

## 2020-10-19 NOTE — Assessment & Plan Note (Signed)
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  Recheck iron levels and CBC in 6 months and follow-up after that with a MyChart virtual visit. 

## 2020-10-20 ENCOUNTER — Inpatient Hospital Stay
Payer: No Typology Code available for payment source | Attending: Hematology and Oncology | Admitting: Hematology and Oncology

## 2020-10-20 DIAGNOSIS — D509 Iron deficiency anemia, unspecified: Secondary | ICD-10-CM | POA: Diagnosis not present

## 2020-10-20 HISTORY — PX: ABLATION: SHX5711

## 2020-10-22 ENCOUNTER — Other Ambulatory Visit: Payer: Self-pay

## 2020-10-22 ENCOUNTER — Ambulatory Visit (INDEPENDENT_AMBULATORY_CARE_PROVIDER_SITE_OTHER): Payer: No Typology Code available for payment source | Admitting: Family Medicine

## 2020-10-22 ENCOUNTER — Encounter (INDEPENDENT_AMBULATORY_CARE_PROVIDER_SITE_OTHER): Payer: Self-pay | Admitting: Family Medicine

## 2020-10-22 ENCOUNTER — Encounter (HOSPITAL_COMMUNITY): Payer: Self-pay | Admitting: *Deleted

## 2020-10-22 VITALS — BP 98/54 | HR 65 | Temp 98.1°F | Ht 67.0 in | Wt 151.0 lb

## 2020-10-22 DIAGNOSIS — Z9189 Other specified personal risk factors, not elsewhere classified: Secondary | ICD-10-CM

## 2020-10-22 DIAGNOSIS — Z6832 Body mass index (BMI) 32.0-32.9, adult: Secondary | ICD-10-CM

## 2020-10-22 DIAGNOSIS — E8881 Metabolic syndrome: Secondary | ICD-10-CM | POA: Diagnosis not present

## 2020-10-22 DIAGNOSIS — E669 Obesity, unspecified: Secondary | ICD-10-CM | POA: Diagnosis not present

## 2020-10-22 DIAGNOSIS — D508 Other iron deficiency anemias: Secondary | ICD-10-CM | POA: Diagnosis not present

## 2020-10-22 MED ORDER — SEMAGLUTIDE (2 MG/DOSE) 8 MG/3ML ~~LOC~~ SOPN: 2.0000 mg | PEN_INJECTOR | SUBCUTANEOUS | 0 refills | Status: DC

## 2020-10-23 NOTE — Progress Notes (Signed)
Chief Complaint:   OBESITY Leah Gilmore is here to discuss her progress with her obesity treatment plan along with follow-up of her obesity related diagnoses. [name] is on following a lower carbohydrate, vegetable and lean protein rich diet plan and states she is following her eating plan approximately 70% of the time. Leah Gilmore states she is doing HIIT and cardio 30 minutes 3 times per week.  Today's visit was #: 25 Starting weight: 209 lbs Starting date: 04/30/2019 Today's weight: 151 lbs Today's date: 10/22/2020 Total lbs lost to date: 58 Total lbs lost since last in-office visit: 8  Interim History: Leah Gilmore has a new foster baby (62 month old) so life has been busy. She is doing relatively well on low carb plan. She had a uterine ablation 2 days ago and she is feeling a bit crampy. This is her first appt back in the office in 2 months. She denies hunger.  Subjective:   1. Insulin resistance [name] is on Ozempic 2 mg and denies GI side effects.  2. Other iron deficiency anemia Her iron level is 24 and ferritin 5. She has previously received iron infusions.  3. At risk for deficient intake of food The patient is at a higher than average risk of deficient intake of food due to Ozempic.  Assessment/Plan:   1. Insulin resistance [name] will continue to work on weight loss, exercise, and decreasing simple carbohydrates to help decrease the risk of diabetes. [name] agreed to follow-up with [name] as directed to closely monitor her progress.  Refill- Semaglutide, 2 MG/DOSE, 8 MG/3ML SOPN; Inject 2 mg into the skin once a week.  Dispense: 3 mL; Refill: 0  2. Other iron deficiency anemia Follow up with Hematology for assessment of need for infusions. Orders and follow up as documented in patient record.   Counseling Iron is essential for our bodies to make red blood cells.  Reasons that someone may be deficient include: an iron-deficient diet (more likely in those following vegan or vegetarian diets), women with  heavy menses, patients with GI disorders or poor absorption, patients that have had bariatric surgery, frequent blood donors, patients with cancer, and patients with heart disease.   An iron supplement has been recommended. This is found over-the-counter.  Iron-rich foods include dark leafy greens, red and white meats, eggs, seafood, and beans.   Certain foods and drinks prevent your body from absorbing iron properly. Avoid eating these foods in the same meal as iron-rich foods or with iron supplements. These foods include: coffee, black tea, and red wine; milk, dairy products, and foods that are high in calcium; beans and soybeans; whole grains.  Constipation can be a side effect of iron supplementation. Increased water and fiber intake are helpful. Water goal: > 2 liters/day. Fiber goal: > 25 grams/day.  3. At risk for deficient intake of food Leah Gilmore was given approximately 15 minutes of deficit intake of food prevention counseling today. Leah Gilmore is at risk for eating too few calories based on current food recall. She was encouraged to focus on meeting caloric and protein goals according to her recommended meal plan.   4. Obesity with current BMI of 23.7  Mai is currently in the action stage of change. As such, her goal is to continue with weight loss efforts. She has agreed to following a lower carbohydrate, vegetable and lean protein rich diet plan.   Exercise goals:  As is  Behavioral modification strategies: increasing lean protein intake, meal planning and cooking strategies, keeping  healthy foods in the home, and planning for success.  Leah Gilmore has agreed to follow-up with our clinic in 4-5 weeks. She was informed of the importance of frequent follow-up visits to maximize her success with intensive lifestyle modifications for her multiple health conditions.   Objective:   Blood pressure (!) 98/54, pulse 65, temperature 98.1 F (36.7 C), height 5\' 7"  (1.702 m), weight 151 lb (68.5 kg), SpO2 99  %. Body mass index is 23.65 kg/m.  General: Cooperative, alert, well developed, in no acute distress. HEENT: Conjunctivae and lids unremarkable. Cardiovascular: Regular rhythm.  Lungs: Normal work of breathing. Neurologic: No focal deficits.   Lab Results  Component Value Date   CREATININE 0.74 03/19/2020   BUN 12 03/19/2020   NA 139 03/19/2020   K 4.2 03/19/2020   CL 106 03/19/2020   CO2 25 03/19/2020   Lab Results  Component Value Date   ALT 11 03/19/2020   AST 19 03/19/2020   ALKPHOS 53 03/19/2020   BILITOT 1.1 03/19/2020   Lab Results  Component Value Date   HGBA1C 4.8 05/01/2019   HGBA1C 4.8 04/30/2019   No results found for: INSULIN Lab Results  Component Value Date   TSH 2.14 04/17/2019   Lab Results  Component Value Date   CHOL 168 04/17/2019   HDL 71 04/17/2019   LDLCALC 82 04/17/2019   TRIG 74 04/17/2019   CHOLHDL 2.4 04/17/2019   Lab Results  Component Value Date   VD25OH 37 03/19/2020   VD25OH 18 (L) 04/17/2019   VD25OH 23.13 (L) 03/16/2018   Lab Results  Component Value Date   WBC 5.9 03/19/2020   HGB 9.6 (L) 03/19/2020   HCT 30.1 (L) 03/19/2020   MCV 85.0 03/19/2020   PLT 346 03/19/2020   Lab Results  Component Value Date   IRON 24 (L) 03/19/2020   TIBC 404 03/19/2020   FERRITIN 5 (L) 03/19/2020   Attestation Statements:   Reviewed by clinician on day of visit: allergies, medications, problem list, medical history, surgical history, family history, social history, and previous encounter notes.  05/17/2020, CMA, am acting as transcriptionist for Edmund Hilda, MD.   I have reviewed the above documentation for accuracy and completeness, and I agree with the above. - Reuben Likes, MD

## 2020-10-24 ENCOUNTER — Telehealth: Payer: Self-pay | Admitting: Hematology and Oncology

## 2020-10-24 NOTE — Telephone Encounter (Signed)
Scheduled per 8/15 los, called pt and left a msg

## 2020-10-26 NOTE — Progress Notes (Signed)
Patient did not answer her phone. If she calls [name] back she will need to be rescheduled with labs followed by follow-up 2 days later.

## 2020-10-27 ENCOUNTER — Inpatient Hospital Stay: Payer: No Typology Code available for payment source | Admitting: Hematology and Oncology

## 2020-10-27 DIAGNOSIS — D509 Iron deficiency anemia, unspecified: Secondary | ICD-10-CM

## 2020-10-27 NOTE — Assessment & Plan Note (Signed)
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

## 2020-10-27 NOTE — Progress Notes (Signed)
This encounter was created in error - please disregard.

## 2020-11-03 ENCOUNTER — Encounter: Payer: Self-pay | Admitting: Family Medicine

## 2020-11-03 ENCOUNTER — Ambulatory Visit: Payer: No Typology Code available for payment source | Admitting: Hematology and Oncology

## 2020-11-03 DIAGNOSIS — U071 COVID-19: Secondary | ICD-10-CM

## 2020-11-03 HISTORY — DX: COVID-19: U07.1

## 2020-11-06 ENCOUNTER — Other Ambulatory Visit: Payer: Self-pay

## 2020-11-06 ENCOUNTER — Emergency Department: Admit: 2020-11-06 | Discharge status: Absent without leave | Payer: Self-pay

## 2020-11-06 ENCOUNTER — Emergency Department (INDEPENDENT_AMBULATORY_CARE_PROVIDER_SITE_OTHER)
Admission: EM | Admit: 2020-11-06 | Discharge: 2020-11-06 | Disposition: A | Discharge status: Home / Self Care | Payer: No Typology Code available for payment source | Source: Home / Self Care

## 2020-11-06 ENCOUNTER — Encounter: Payer: Self-pay | Admitting: Emergency Medicine

## 2020-11-06 DIAGNOSIS — R0989 Other specified symptoms and signs involving the circulatory and respiratory systems: Secondary | ICD-10-CM

## 2020-11-06 DIAGNOSIS — R059 Cough, unspecified: Secondary | ICD-10-CM

## 2020-11-06 DIAGNOSIS — U071 COVID-19: Secondary | ICD-10-CM

## 2020-11-06 MED ORDER — METHYLPREDNISOLONE SODIUM SUCC 125 MG IJ SOLR
125.0000 mg | Freq: Once | INTRAMUSCULAR | Status: AC
  Administered 2020-11-06: 125 mg via INTRAMUSCULAR

## 2020-11-06 MED ORDER — BENZONATATE 200 MG PO CAPS: 200.0000 mg | ORAL_CAPSULE | Freq: Three times a day (TID) | ORAL | 0 refills | Status: AC | PRN

## 2020-11-06 MED ORDER — METHYLPREDNISOLONE 4 MG PO TBPK: ORAL_TABLET | ORAL | 0 refills | Status: DC

## 2020-11-06 NOTE — ED Provider Notes (Signed)
[facility]    CSN: [id number] Arrival date & time: 11/06/20  0947      History   Chief Complaint Chief Complaint  Patient presents with  . Covid Positive  . Cough    HPI Leah Gilmore is a 35 y.o. female.   HPI 35 year old female presents with COVID-19, cough, and congestion.   Past Medical History:  Diagnosis Date  . Anemia    Never had a blood transfusion  . COVID-19 11/03/2020  . Iron deficiency anemia 03/11/2017  . Multiple food allergies   . PCOS (polycystic ovarian syndrome) 03/29/2016  . 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 Vitamin D deficiency     Patient Active Problem List   Diagnosis Date Noted  . Encounter for consultation 04/11/2020  . Iron deficiency anemia 03/11/2017  . PCOS (polycystic ovarian syndrome) 03/29/2016  . 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 ABLATION  10/20/2020  . GASTRIC ROUX-EN-Y N/A 03/29/2016   Procedure: LAPAROSCOPIC ROUX-EN-Y GASTRIC BYPASS WITH UPPER ENDOSCOPY;  Surgeon: Gaynelle Adu, MD;  Location: [facility];  Service: General;  Laterality: N/A;  . UPPER GI ENDOSCOPY  03/29/2016   Procedure: UPPER GI ENDOSCOPY;  Surgeon: Gaynelle Adu, MD;  Location: [facility];  Service: General;;    OB History     Gravida  0   Para  0   Term  0   Preterm  0   AB  0   Living  0      SAB  0   IAB  0   Ectopic  0   Multiple  0   Live Births  0            Home Medications    Prior to Admission medications   Medication Sig Start Date End Date Taking? Authorizing Provider  benzonatate (TESSALON) 200 MG capsule Take 1 capsule (200 mg total) by mouth 3 (three) times daily as needed for up to 7 days for cough. 11/06/20 11/13/20 Yes Trevor Iha, FNP  methylPREDNISolone (MEDROL DOSEPAK) 4 MG TBPK tablet Take as directed 11/06/20  Yes Trevor Iha, FNP  Calcium 500 MG tablet Take 500 mg by mouth 3 (three) times daily. Patient not taking: Reported on 11/06/2020     [provider]  Multiple Vitamins-Iron (MULTIVITAMIN/IRON PO) Take 1 tablet by mouth daily. Patient not taking: Reported on 11/06/2020    [provider]  Semaglutide, 2 MG/DOSE, 8 MG/3ML SOPN Inject 2 mg into the skin once a week. 10/22/20   Langston Reusing, MD  Vitamin D, Cholecalciferol, 50 MCG (2000 UT) CAPS Take 2,000 Units by mouth. Patient not taking: Reported on 11/06/2020    [provider]  Vitamin D, Ergocalciferol, (DRISDOL) 1.25 MG (50000 UNIT) CAPS capsule TAKE 1 CAPSULE (50,000 UNITS TOTAL) BY MOUTH ONCE A WEEK. 09/22/20 09/22/21  Langston Reusing, MD    Family History Family History  Problem Relation Age of Onset  . Thyroid disease Mother   . Depression Mother   . Anxiety disorder Mother   . Obesity Mother   . Cancer Father 37       lung cancer, smoker  . Alcohol abuse Father   . Drug abuse Father   . Liver disease Father   . Diabetes Maternal Grandmother   . Cancer Maternal Grandfather        prostate  . Diabetes Paternal Grandmother   . Hypertension Paternal Grandmother     Social History  Social History   Tobacco Use  . Smoking status: Never  . Smokeless tobacco: Never  Vaping Use  . Vaping Use: Never used  Substance Use Topics  . Alcohol use: No    Alcohol/week: 0.0 standard drinks    Comment: rarely  . Drug use: No     Allergies   Azithromycin, Food, Grapefruit extract, and Kiwi extract   Review of Systems Review of Systems  HENT:  Positive for congestion.   Respiratory:  Positive for cough.   All other systems reviewed and are negative.   Physical Exam Triage Vital Signs ED Triage Vitals  Enc Vitals Group     BP      Pulse      Resp      Temp      Temp src      SpO2      Weight      Height      Head Circumference      Peak Flow      Pain Score      Pain Loc      Pain Edu?      Excl. in GC?    No data found.  Updated Vital Signs BP 105/68 (BP Location: Right Arm)   Pulse 68   Temp 98.8 F  (37.1 C) (Oral)   Resp 16   Wt 145 lb (65.8 kg)   SpO2 100%   BMI 22.71 kg/m      Physical Exam Vitals and nursing note reviewed.  Constitutional:      Appearance: Normal appearance. She is normal weight. She is ill-appearing.  HENT:     Head: Normocephalic and atraumatic.     Right Ear: Tympanic membrane, ear canal and external ear normal.     Left Ear: Tympanic membrane, ear canal and external ear normal.     Nose: Nose normal.     Mouth/Throat:     Mouth: Mucous membranes are moist.     Pharynx: Oropharynx is clear.  Eyes:     Extraocular Movements: Extraocular movements intact.     Conjunctiva/sclera: Conjunctivae normal.     Pupils: Pupils are equal, round, and reactive to light.  Cardiovascular:     Rate and Rhythm: Normal rate and regular rhythm.     Pulses: Normal pulses.     Heart sounds: Normal heart sounds.  Pulmonary:     Effort: Pulmonary effort is normal.     Breath sounds: No wheezing, rhonchi or rales.  Musculoskeletal:        General: Normal range of motion.     Cervical back: Normal range of motion and neck supple. No tenderness.  Lymphadenopathy:     Cervical: No cervical adenopathy.  Skin:    General: Skin is warm and dry.  Neurological:     General: No focal deficit present.     Mental Status: She is alert and oriented to person, place, and time. Mental status is at baseline.  Psychiatric:        Mood and Affect: Mood normal.        Behavior: Behavior normal.        Thought Content: Thought content normal.     UC Treatments / Results  Labs (all labs ordered are listed, but only abnormal results are displayed) Labs Reviewed - No data to display  EKG   Radiology No results found.  Procedures Procedures (including critical care time)  Medications Ordered in UC Medications  methylPREDNISolone sodium succinate (SOLU-MEDROL)  125 mg/2 mL injection 125 mg (125 mg Intramuscular Given 11/06/20 1035)    Initial Impression / Assessment and  Plan / UC Course  I have reviewed the triage vital signs and the nursing notes.  Pertinent labs & imaging results that were available during my care of the patient were reviewed by me and considered in my medical decision making (see chart for details).     MDM: 1.  Cough-Rx'd Medrol dose pack and Tessalon Perles; 2.  Chest congestion-I am Solu-Medrol 125 given once in clinic today prior to discharge; 3.  COVID-19-advised patient conservative measures for now may use OTC Tylenol 1000 mg 1-2 times daily, as needed for fever and myalgias. Advised/instructed patient to start Medrol Dosepak tomorrow morning, Friday, 11/07/2020, advised take medication as directed with food to completion.  Encourage patient to increase daily water intake while taking these medication.  Work note provided per patient request.  Patient discharged home, hemodynamically stable. Final Clinical Impressions(s) / UC Diagnoses   Final diagnoses:  Cough  Chest congestion  COVID-19     Discharge Instructions      Advised/instructed patient to start Medrol Dosepak tomorrow morning, Friday, 11/07/2020, advised take medication as directed with food to completion.  Encourage patient to increase daily water intake while taking these medication.     ED Prescriptions     Medication Sig Dispense Auth. Provider   methylPREDNISolone (MEDROL DOSEPAK) 4 MG TBPK tablet Take as directed 1 each Trevor Iha, FNP   benzonatate (TESSALON) 200 MG capsule Take 1 capsule (200 mg total) by mouth 3 (three) times daily as needed for up to 7 days for cough. 30 capsule Trevor Iha, FNP      PDMP not reviewed this encounter.   Trevor Iha, FNP 11/06/20 1044

## 2020-11-06 NOTE — Discharge Instructions (Addendum)
Advised/instructed patient to start Medrol Dosepak tomorrow morning, Friday, 11/07/2020, advised take medication as directed with food to completion.  COVID-19-advised patient conservative measures for now may use OTC Tylenol 1000 mg 1-2 times daily, as needed for fever and myalgias.Encourage patient to increase daily water intake while taking these medication.

## 2020-11-06 NOTE — ED Triage Notes (Signed)
COVID positive on 11/04/20 w/ home test  Cough & congestion since then  No meds OTC

## 2020-11-26 ENCOUNTER — Ambulatory Visit (INDEPENDENT_AMBULATORY_CARE_PROVIDER_SITE_OTHER): Payer: No Typology Code available for payment source | Admitting: Family Medicine

## 2020-11-27 LAB — IRON,TIBC AND FERRITIN PANEL
%SAT: 13
Ferritin: 49
Iron: 38
TIBC: 302

## 2020-11-27 LAB — CBC AND DIFFERENTIAL
Neutrophils Absolute: 1578
WBC: 3

## 2020-11-28 LAB — IRON, TOTAL/TOTAL IRON BINDING CAP
%SAT: 13 % (calc) — ABNORMAL LOW (ref 16–45)
Iron: 38 ug/dL — ABNORMAL LOW (ref 40–190)
TIBC: 302 mcg/dL (calc) (ref 250–450)

## 2020-11-28 LAB — CBC WITH DIFFERENTIAL/PLATELET
Absolute Monocytes: 330 cells/uL (ref 200–950)
Basophils Absolute: 21 cells/uL (ref 0–200)
Basophils Relative: 0.7 %
Eosinophils Absolute: 39 cells/uL (ref 15–500)
Eosinophils Relative: 1.3 %
HCT: 32.1 % — ABNORMAL LOW (ref 35.0–45.0)
Hemoglobin: 10.2 g/dL — ABNORMAL LOW (ref 11.7–15.5)
Lymphs Abs: 1032 cells/uL (ref 850–3900)
MCH: 29.4 pg (ref 27.0–33.0)
MCHC: 31.8 g/dL — ABNORMAL LOW (ref 32.0–36.0)
MCV: 92.5 fL (ref 80.0–100.0)
MPV: 11.7 fL (ref 7.5–12.5)
Monocytes Relative: 11 %
Neutro Abs: 1578 cells/uL (ref 1500–7800)
Neutrophils Relative %: 52.6 %
Platelets: 241 10*3/uL (ref 140–400)
RBC: 3.47 10*6/uL — ABNORMAL LOW (ref 3.80–5.10)
RDW: 11.7 % (ref 11.0–15.0)
Total Lymphocyte: 34.4 %
WBC: 3 10*3/uL — ABNORMAL LOW (ref 3.8–10.8)

## 2020-11-28 LAB — FERRITIN: Ferritin: 49 ng/mL (ref 16–154)

## 2020-11-28 LAB — FOLATE: Folate: 12.8 ng/mL

## 2020-11-28 LAB — VITAMIN B12: Vitamin B-12: 1088 pg/mL (ref 200–1100)

## 2020-12-01 ENCOUNTER — Other Ambulatory Visit (INDEPENDENT_AMBULATORY_CARE_PROVIDER_SITE_OTHER): Payer: Self-pay

## 2020-12-01 ENCOUNTER — Ambulatory Visit (INDEPENDENT_AMBULATORY_CARE_PROVIDER_SITE_OTHER): Payer: No Typology Code available for payment source | Admitting: Family Medicine

## 2020-12-01 ENCOUNTER — Encounter (INDEPENDENT_AMBULATORY_CARE_PROVIDER_SITE_OTHER): Payer: Self-pay | Admitting: Family Medicine

## 2020-12-01 ENCOUNTER — Other Ambulatory Visit: Payer: Self-pay

## 2020-12-01 VITALS — BP 97/62 | HR 76 | Temp 98.1°F | Ht 67.0 in | Wt 142.0 lb

## 2020-12-01 DIAGNOSIS — E669 Obesity, unspecified: Secondary | ICD-10-CM

## 2020-12-01 DIAGNOSIS — Z6832 Body mass index (BMI) 32.0-32.9, adult: Secondary | ICD-10-CM

## 2020-12-01 DIAGNOSIS — E8881 Metabolic syndrome: Secondary | ICD-10-CM | POA: Diagnosis not present

## 2020-12-01 DIAGNOSIS — E559 Vitamin D deficiency, unspecified: Secondary | ICD-10-CM | POA: Diagnosis not present

## 2020-12-01 DIAGNOSIS — F439 Reaction to severe stress, unspecified: Secondary | ICD-10-CM

## 2020-12-01 DIAGNOSIS — Z9189 Other specified personal risk factors, not elsewhere classified: Secondary | ICD-10-CM | POA: Diagnosis not present

## 2020-12-01 MED ORDER — SERTRALINE HCL 25 MG PO TABS: 25.0000 mg | ORAL_TABLET | Freq: Every day | ORAL | 1 refills | Status: DC

## 2020-12-01 MED ORDER — SEMAGLUTIDE (2 MG/DOSE) 8 MG/3ML ~~LOC~~ SOPN: 1.0000 mg | PEN_INJECTOR | SUBCUTANEOUS | 0 refills | Status: DC

## 2020-12-01 MED ORDER — SEMAGLUTIDE (1 MG/DOSE) 4 MG/3ML ~~LOC~~ SOPN: 1.0000 mg | PEN_INJECTOR | SUBCUTANEOUS | 0 refills | Status: DC

## 2020-12-02 ENCOUNTER — Other Ambulatory Visit (INDEPENDENT_AMBULATORY_CARE_PROVIDER_SITE_OTHER): Payer: Self-pay | Admitting: Family Medicine

## 2020-12-02 DIAGNOSIS — E559 Vitamin D deficiency, unspecified: Secondary | ICD-10-CM

## 2020-12-02 NOTE — Progress Notes (Signed)
Chief Complaint:   OBESITY Leah Gilmore is here to discuss her progress with her obesity treatment plan along with follow-up of her obesity related diagnoses. Leah Gilmore is on following a lower carbohydrate, vegetable and lean protein rich diet plan and states she is following her eating plan approximately 80% of the time. Leah Gilmore states she is working with a Psychologist, educational for 30 minutes 3 times per week.  Today's visit was #: 26 Starting weight: 209 lbs Starting date: 04/30/2019 Today's weight: 142 lbs Today's date: 12/01/2020 Total lbs lost to date: 67 lbs Total lbs lost since last in-office visit: 9 lbs  Interim History: Leah Gilmore had blood work done, and it is still showing iron deficiency.  She is starting to feel super tired, but is not sure if it is attributed to the iron deficiency or to her children's schedule.  She is napping daily.  She is exercising ~11:15 am, and that is her time for the day.  Subjective:   1. Vitamin D deficiency Leah Gilmore is on vitamin D (last level of 37).  Denies nausea, vomiting, or muscle weakness..Eendorses fatigue.  2. Insulin resistance She is on Ozempic 2 mg.  She is wondering if Ozempic should be decreased.  3. Situational stress She is getting significant criticism from her family and friends about the quantity of her weight loss.  4. At risk for anxiety Leah Gilmore is at risk of developing anxiety due to stress, personal and or family history or current situation.  Assessment/Plan:   1. Vitamin D deficiency Low Vitamin D level contributes to fatigue and are associated with obesity, breast, and colon cancer. She agrees to continue to take prescription Vitamin D @50 ,000 IU every week and will follow-up for routine testing of Vitamin D, at least 2-3 times per year to avoid over-replacement.   - Refill ergocalciferol 50,000 IU once weekly, #4, 0 refills.  2. Insulin resistance Leah Gilmore will continue to work on weight loss, exercise, and decreasing simple carbohydrates to help decrease  the risk of diabetes. Leah Gilmore agreed to follow-up with Leah Gilmore as directed to closely monitor her progress.  Decrease Ozempic to 1 mg subcutaneously weekly.  - Decrease Semaglutide, 1 MG/DOSE, 4 MG/3ML SOPN; Inject 1 mg as directed once a week.  Dispense: 3 mL; Refill: 0  3. Situational stress Start Zoloft 25 mg daily, as per below.  - Start sertraline (ZOLOFT) 25 MG tablet; Take 1 tablet (25 mg total) by mouth daily.  Dispense: 90 tablet; Refill: 1  4. At risk for anxiety Leah Gilmore was given approximately 15 minutes of anxiety risk counseling today. She has risk factors for anxiety including situational stress. We discussed the importance of a healthy work life balance, a healthy relationship with food and a good support system.  Repetitive spaced learning was employed today to elicit superior memory formation and behavioral change.   5. Obesity with current BMI of 22.4  Leah Gilmore is currently in the action stage of change. As such, her goal is to continue with weight loss efforts. She has agreed to following a lower carbohydrate, vegetable and lean protein rich diet plan.   Exercise goals:  As is.  Behavioral modification strategies: increasing lean protein intake, increasing vegetables, meal planning and cooking strategies, and keeping healthy foods in the home.  Leah Gilmore has agreed to follow-up with our clinic in 3-4 weeks. She was informed of the importance of frequent follow-up visits to maximize her success with intensive lifestyle modifications for her multiple health conditions.   Objective:  Blood pressure 97/62, pulse 76, temperature 98.1 F (36.7 C), height 5\' 7"  (1.702 m), weight 142 lb (64.4 kg), last menstrual period 11/27/2020, SpO2 100 %. Body mass index is 22.24 kg/m.  General: Cooperative, alert, well developed, in no acute distress. HEENT: Conjunctivae and lids unremarkable. Cardiovascular: Regular rhythm.  Lungs: Normal work of breathing. Neurologic: No focal deficits.   Lab Results   Component Value Date   CREATININE 0.74 03/19/2020   BUN 12 03/19/2020   NA 139 03/19/2020   K 4.2 03/19/2020   CL 106 03/19/2020   CO2 25 03/19/2020   Lab Results  Component Value Date   ALT 11 03/19/2020   AST 19 03/19/2020   ALKPHOS 53 03/19/2020   BILITOT 1.1 03/19/2020   Lab Results  Component Value Date   HGBA1C 4.8 05/01/2019   HGBA1C 4.8 04/30/2019   Lab Results  Component Value Date   TSH 2.14 04/17/2019   Lab Results  Component Value Date   CHOL 168 04/17/2019   HDL 71 04/17/2019   LDLCALC 82 04/17/2019   TRIG 74 04/17/2019   CHOLHDL 2.4 04/17/2019   Lab Results  Component Value Date   VD25OH 37 03/19/2020   VD25OH 18 (L) 04/17/2019   VD25OH 23.13 (L) 03/16/2018   Lab Results  Component Value Date   WBC 3.0 (L) 11/27/2020   HGB 10.2 (L) 11/27/2020   HCT 32.1 (L) 11/27/2020   MCV 92.5 11/27/2020   PLT 241 11/27/2020   Lab Results  Component Value Date   IRON 38 (L) 11/27/2020   TIBC 302 11/27/2020   FERRITIN 49 11/27/2020   Attestation Statements:   Reviewed by clinician on day of visit: allergies, medications, problem list, medical history, surgical history, family history, social history, and previous encounter notes.  I, 11/29/2020, CMA, am acting as transcriptionist for Insurance claims handler, MD.  I have reviewed the above documentation for accuracy and completeness, and I agree with the above. - Leah Likes, MD

## 2020-12-03 NOTE — Telephone Encounter (Signed)
Last OV with Dr Ukleja 

## 2020-12-04 MED ORDER — VITAMIN D (ERGOCALCIFEROL) 1.25 MG (50000 UNIT) PO CAPS: ORAL_CAPSULE | ORAL | 0 refills | Status: DC

## 2020-12-19 ENCOUNTER — Encounter: Payer: Self-pay | Admitting: Plastic Surgery

## 2020-12-19 ENCOUNTER — Ambulatory Visit (INDEPENDENT_AMBULATORY_CARE_PROVIDER_SITE_OTHER): Payer: No Typology Code available for payment source | Admitting: Plastic Surgery

## 2020-12-19 ENCOUNTER — Other Ambulatory Visit: Payer: Self-pay

## 2020-12-19 VITALS — BP 101/55 | HR 68 | Ht 67.0 in | Wt 142.6 lb

## 2020-12-19 DIAGNOSIS — M793 Panniculitis, unspecified: Secondary | ICD-10-CM | POA: Diagnosis not present

## 2020-12-19 DIAGNOSIS — G8929 Other chronic pain: Secondary | ICD-10-CM

## 2020-12-19 DIAGNOSIS — M546 Pain in thoracic spine: Secondary | ICD-10-CM | POA: Diagnosis not present

## 2020-12-19 DIAGNOSIS — M549 Dorsalgia, unspecified: Secondary | ICD-10-CM | POA: Insufficient documentation

## 2020-12-19 NOTE — Progress Notes (Addendum)
Patient ID: Leah Gilmore, female    DOB: December 05, 1985, 35 y.o.   MRN: [id number]   Chief Complaint  Patient presents with   Advice Only    The patient is a 35 year old female here for evaluation of her abdomen.  The patient has been seen in the past with concerns for panniculitis.  Her heaviest weight was 328 pounds in 2018.  She underwent a review MRI with Dr. Andrey Campanile in 2018 and was able to lose down to 142 pounds.  She has been stable at this weight for over a year.  During this period she has been dealing with fungal infections in her skin creases.  Nothing seems to help.  She does not have any palpable hernia or sign of rectus diastases.  She is not planning on having any children.  She fosters 4 children with her significant other.  She is having to wear larger clothes sizes because her waist is so small and then her abdomen is very large.   Review of Systems  Constitutional: Negative.   HENT: Negative.    Eyes: Negative.   Respiratory: Negative.  Negative for chest tightness.   Cardiovascular: Negative.   Gastrointestinal: Negative.  Negative for abdominal pain.  Endocrine: Negative.   Genitourinary: Negative.   Musculoskeletal:  Positive for back pain and neck pain.  Skin:  Positive for rash.  Neurological: Negative.   Hematological: Negative.   Psychiatric/Behavioral: Negative.     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   Vitamin D deficiency     Past Surgical History:  Procedure Laterality Date   ABLATION  10/20/2020   GASTRIC ROUX-EN-Y N/A 03/29/2016   Procedure: LAPAROSCOPIC ROUX-EN-Y GASTRIC BYPASS WITH UPPER ENDOSCOPY;  Surgeon: Gaynelle Adu, MD;  Location: [facility];  Service: General;  Laterality: N/A;   UPPER GI ENDOSCOPY  03/29/2016   Procedure:  UPPER GI ENDOSCOPY;  Surgeon: Gaynelle Adu, MD;  Location: [facility];  Service: General;;      Current Outpatient Medications:    Calcium 500 MG tablet, Take 500 mg by mouth 3 (three) times daily., Disp: , Rfl:    methylPREDNISolone (MEDROL DOSEPAK) 4 MG TBPK tablet, Take as directed, Disp: 1 each, Rfl: 0   Multiple Vitamins-Iron (MULTIVITAMIN/IRON PO), Take 1 tablet by mouth daily., Disp: , Rfl:    Semaglutide, 1 MG/DOSE, 4 MG/3ML SOPN, Inject 1 mg as directed once a week., Disp: 3 mL, Rfl: 0   sertraline (ZOLOFT) 25 MG tablet, Take 1 tablet (25 mg total) by mouth daily., Disp: 90 tablet, Rfl: 1   Vitamin D, Cholecalciferol, 50 MCG (2000 UT) CAPS, Take 2,000 Units by mouth., Disp: , Rfl:    Vitamin D, Ergocalciferol, (DRISDOL) 1.25 MG (50000 UNIT) CAPS capsule, TAKE 1 CAPSULE (50,000 UNITS TOTAL) BY MOUTH ONCE A WEEK., Disp: 4 capsule, Rfl: 0   Objective:   Vitals:   12/19/20 1402  BP: (!) 101/55  Pulse: 68  SpO2: 100%    Physical Exam Vitals and nursing note reviewed.  Constitutional:      Appearance: Normal appearance.  HENT:     Head: Normocephalic and atraumatic.  Cardiovascular:     Rate and Rhythm: Normal rate.     Pulses: Normal pulses.  Pulmonary:     Effort:  Pulmonary effort is normal.  Abdominal:     General: Abdomen is flat. There is no distension.  Musculoskeletal:        General: No swelling or deformity.  Skin:    General: Skin is warm.     Coloration: Skin is not jaundiced.     Findings: No bruising.  Neurological:     Mental Status: She is alert and oriented to person, place, and time.  Psychiatric:        Mood and Affect: Mood normal.        Behavior: Behavior normal.        Thought Content: Thought content normal.    Assessment & Plan:  Panniculitis  Chronic bilateral thoracic back pain  The patient is interested in a panniculectomy and understands the limitations.  She seems very realistic and would like to avoid a fleur-de-lis incision if  possible I think that is a good idea.  We will provide her with a quote.  I also sent her literature for abdominoplasty.   Pictures were obtained of the patient and placed in the chart with the patient's or guardian's permission.  Alena Bills Koraima Albertsen, DO

## 2020-12-24 ENCOUNTER — Telehealth: Payer: Self-pay | Admitting: Plastic Surgery

## 2020-12-24 NOTE — Telephone Encounter (Signed)
Left message on patient's voicemail to follow up on rash that was noted in Dr. Kittie Plater office note. I needed to know if she had seen any provider regarding the rash under the stomach. If she has, we need to obtain that information before submitting. If not, to ask if she is okay with [name] submitting without it. Patient to call back to the office to advise.

## 2020-12-25 ENCOUNTER — Encounter: Payer: Self-pay | Admitting: Plastic Surgery

## 2020-12-29 ENCOUNTER — Encounter (INDEPENDENT_AMBULATORY_CARE_PROVIDER_SITE_OTHER): Payer: Self-pay | Admitting: Family Medicine

## 2020-12-29 ENCOUNTER — Ambulatory Visit (INDEPENDENT_AMBULATORY_CARE_PROVIDER_SITE_OTHER): Payer: No Typology Code available for payment source | Admitting: Family Medicine

## 2020-12-29 ENCOUNTER — Other Ambulatory Visit: Payer: Self-pay

## 2020-12-29 VITALS — BP 96/70 | HR 70 | Temp 98.2°F | Ht 67.0 in | Wt 138.0 lb

## 2020-12-29 DIAGNOSIS — Z9189 Other specified personal risk factors, not elsewhere classified: Secondary | ICD-10-CM | POA: Diagnosis not present

## 2020-12-29 DIAGNOSIS — E669 Obesity, unspecified: Secondary | ICD-10-CM

## 2020-12-29 DIAGNOSIS — F439 Reaction to severe stress, unspecified: Secondary | ICD-10-CM

## 2020-12-29 DIAGNOSIS — Z6832 Body mass index (BMI) 32.0-32.9, adult: Secondary | ICD-10-CM

## 2020-12-29 MED ORDER — SERTRALINE HCL 50 MG PO TABS
75.0000 mg | ORAL_TABLET | Freq: Every day | ORAL | 0 refills | Status: DC
Start: 2020-12-29 — End: 2021-04-20

## 2020-12-30 NOTE — Progress Notes (Signed)
Chief Complaint:   OBESITY Leah Gilmore is here to discuss her progress with her obesity treatment plan along with follow-up of her obesity related diagnoses. Leah Gilmore is on following a lower carbohydrate, vegetable and lean protein rich diet plan and states she is following her eating plan approximately 50% of the time. Leah Gilmore states she is working with a Psychologist, educational ? minutes 3 times per week.  Today's visit was #: 27 Starting weight: 209 lbs Starting date: 04/30/2019 Today's weight: 138 lbs Today's date: 12/29/2020 Total lbs lost to date: 71 Total lbs lost since last in-office visit: 4  Interim History: Leah Gilmore is feeling better after her last appt with the increase of Zoloft to 75 mg. She feels much less bothered. She is doing reasonable on low carb plan but has had a real increase in sweet desires. She has been indulging more in food options. Pt is trying to look at food options and get protein in.  Subjective:   1. Situational stress Pt denies suicidal or homicidal ideations. Leah Gilmore is doing well on sertraline.  2. At risk for depression Leah Gilmore is at risk for depression due to situational stress.  Assessment/Plan:   1. Situational stress Behavior modification techniques were discussed today to help Yenny deal with her anxiety.  Orders and follow up as documented in patient record.  Change Zoloft to 75 mg as directed.  Change and Refill- sertraline (ZOLOFT) 50 MG tablet; Take 1.5 tablets (75 mg total) by mouth daily.  Dispense: 135 tablet; Refill: 0  2. At risk for depression Leah Gilmore was given approximately 15 minutes of depression risk counseling today. She has risk factors for depression including situational stress. We discussed the importance of a healthy work life balance, a healthy relationship with food and a good support system.  Repetitive spaced learning was employed today to elicit superior memory formation and behavioral change.   3. Obesity with current BMI of 21.7  Leah Gilmore is currently in the  action stage of change. As such, her goal is to continue with weight loss efforts. She has agreed to following a lower carbohydrate, vegetable and lean protein rich diet plan.   Exercise goals:  As is  Behavioral modification strategies: increasing lean protein intake, meal planning and cooking strategies, keeping healthy foods in the home, and planning for success.  Leah Gilmore has agreed to follow-up with our clinic in 4-5 weeks. She was informed of the importance of frequent follow-up visits to maximize her success with intensive lifestyle modifications for her multiple health conditions.   Objective:   Blood pressure 96/70, pulse 70, temperature 98.2 F (36.8 C), height 5\' 7"  (1.702 m), weight 138 lb (62.6 kg), last menstrual period 12/26/2020, SpO2 100 %. Body mass index is 21.61 kg/m.  General: Cooperative, alert, well developed, in no acute distress. HEENT: Conjunctivae and lids unremarkable. Cardiovascular: Regular rhythm.  Lungs: Normal work of breathing. Neurologic: No focal deficits.   Lab Results  Component Value Date   CREATININE 0.74 03/19/2020   BUN 12 03/19/2020   NA 139 03/19/2020   K 4.2 03/19/2020   CL 106 03/19/2020   CO2 25 03/19/2020   Lab Results  Component Value Date   ALT 11 03/19/2020   AST 19 03/19/2020   ALKPHOS 53 03/19/2020   BILITOT 1.1 03/19/2020   Lab Results  Component Value Date   HGBA1C 4.8 05/01/2019   HGBA1C 4.8 04/30/2019   No results found for: INSULIN Lab Results  Component Value Date   TSH 2.14  04/17/2019   Lab Results  Component Value Date   CHOL 168 04/17/2019   HDL 71 04/17/2019   LDLCALC 82 04/17/2019   TRIG 74 04/17/2019   CHOLHDL 2.4 04/17/2019   Lab Results  Component Value Date   VD25OH 37 03/19/2020   VD25OH 18 (L) 04/17/2019   VD25OH 23.13 (L) 03/16/2018   Lab Results  Component Value Date   WBC 3.0 (L) 11/27/2020   HGB 10.2 (L) 11/27/2020   HCT 32.1 (L) 11/27/2020   MCV 92.5 11/27/2020   PLT 241 11/27/2020    Lab Results  Component Value Date   IRON 38 (L) 11/27/2020   TIBC 302 11/27/2020   FERRITIN 49 11/27/2020    Attestation Statements:   Reviewed by clinician on day of visit: allergies, medications, problem list, medical history, surgical history, family history, social history, and previous encounter notes.  Edmund Hilda, CMA, am acting as transcriptionist for Reuben Likes, MD.   I have reviewed the above documentation for accuracy and completeness, and I agree with the above. - Reuben Likes, MD

## 2021-01-09 ENCOUNTER — Institutional Professional Consult (permissible substitution): Payer: No Typology Code available for payment source | Admitting: Plastic Surgery

## 2021-02-02 ENCOUNTER — Ambulatory Visit (INDEPENDENT_AMBULATORY_CARE_PROVIDER_SITE_OTHER): Payer: No Typology Code available for payment source | Admitting: Family Medicine

## 2021-02-17 ENCOUNTER — Encounter (INDEPENDENT_AMBULATORY_CARE_PROVIDER_SITE_OTHER): Payer: Self-pay | Admitting: Family Medicine

## 2021-02-17 ENCOUNTER — Encounter: Payer: Self-pay | Admitting: Physician Assistant

## 2021-02-17 ENCOUNTER — Ambulatory Visit (INDEPENDENT_AMBULATORY_CARE_PROVIDER_SITE_OTHER): Payer: No Typology Code available for payment source | Admitting: Physician Assistant

## 2021-02-17 ENCOUNTER — Other Ambulatory Visit: Payer: Self-pay

## 2021-02-17 VITALS — BP 116/73 | HR 72 | Ht 67.0 in | Wt 143.8 lb

## 2021-02-17 DIAGNOSIS — M793 Panniculitis, unspecified: Secondary | ICD-10-CM

## 2021-02-17 MED ORDER — HYDROCODONE-ACETAMINOPHEN 5-325 MG PO TABS: 1.0000 | ORAL_TABLET | Freq: Four times a day (QID) | ORAL | 0 refills | Status: AC | PRN

## 2021-02-17 MED ORDER — ONDANSETRON 4 MG PO TBDP: 4.0000 mg | ORAL_TABLET | Freq: Three times a day (TID) | ORAL | 0 refills | Status: DC | PRN

## 2021-02-17 MED ORDER — CEPHALEXIN 500 MG PO CAPS: 500.0000 mg | ORAL_CAPSULE | Freq: Four times a day (QID) | ORAL | 0 refills | Status: AC

## 2021-02-17 NOTE — H&P (View-Only) (Signed)
   Patient ID: Leah Gilmore, female    DOB: 07/01/1985, 35 y.o.   MRN: [id number]  Chief Complaint  Patient presents with   Pre-op Exam      ICD-10-CM   1. Panniculitis  M79.3        History of Present Illness: Leah Gilmore is a 35 y.o.  female  with a history of panniculitis.  She presents for preoperative evaluation for upcoming procedure, panniculectomy, scheduled for 03/12/2021 with Dr. Dillingham.  The patient has not had problems with anesthesia.  She denies any personal or family history of blood clots or clotting disorder.  No personal history of cancer.  She will hold her supplemental vitamins prior to surgery.  Denies any latex or tape allergies.  She states that she cannot take NSAIDs due to her gastric bypass.  Summary of Previous Visit: She was seen most recently for consult on 12/19/2020.  At that time, it was noted that she has had previous episodes of panniculitis.  She has been able to lose 142 pounds with assistance of Roux-en-Y gastric bypass 2018 and [facility].  She does not plan on having any additional children and is complaining of ongoing intermittent skin infections in her skin creases along with difficulty finding clothes that fit.  They discussed the fleur-de-lis approach and plan to avoid.  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  Discussed 4 weeks.  PMH Significant for: PCOS, Roux-en-Y gastric bypass 2018, panniculitis.   Past Medical History: Allergies: Allergies  Allergen Reactions   Azithromycin     hives   Food     Kiwi & Grapefruit-tongue swells    Grapefruit Extract Swelling   Kiwi Extract Swelling    Current Medications:  Current Outpatient Medications:    Calcium 500 MG tablet, Take 500 mg by mouth 3 (three) times daily., Disp: , Rfl:    cephALEXin (KEFLEX) 500 MG capsule, Take 1 capsule (500 mg total) by mouth 4 (four) times daily for 3 days., Disp: 12 capsule, Rfl:  0   HYDROcodone-acetaminophen (NORCO) 5-325 MG tablet, Take 1 tablet by mouth every 6 (six) hours as needed for up to 5 days for severe pain., Disp: 20 tablet, Rfl: 0   Multiple Vitamins-Iron (MULTIVITAMIN/IRON PO), Take 1 tablet by mouth daily., Disp: , Rfl:    ondansetron (ZOFRAN-ODT) 4 MG disintegrating tablet, Take 1 tablet (4 mg total) by mouth every 8 (eight) hours as needed for nausea or vomiting., Disp: 20 tablet, Rfl: 0   Semaglutide, 1 MG/DOSE, 4 MG/3ML SOPN, Inject 1 mg as directed once a week., Disp: 3 mL, Rfl: 0   sertraline (ZOLOFT) 50 MG tablet, Take 1.5 tablets (75 mg total) by mouth daily., Disp: 135 tablet, Rfl: 0   Vitamin D, Cholecalciferol, 50 MCG (2000 UT) CAPS, Take 2,000 Units by mouth., Disp: , Rfl:    Vitamin D, Ergocalciferol, (DRISDOL) 1.25 MG (50000 UNIT) CAPS capsule, TAKE 1 CAPSULE (50,000 UNITS TOTAL) BY MOUTH ONCE A WEEK., Disp: 4 capsule, Rfl: 0  Past Medical Problems: 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   Vitamin D deficiency     Past Surgical History: Past Surgical History:  Procedure Laterality Date   ABLATION  10/20/2020   GASTRIC ROUX-EN-Y   N/A 03/29/2016   Procedure: LAPAROSCOPIC ROUX-EN-Y GASTRIC BYPASS WITH UPPER ENDOSCOPY;  Surgeon: Eric Wilson, MD;  Location: [facility];  Service: General;  Laterality: N/A;   UPPER GI ENDOSCOPY  03/29/2016   Procedure: UPPER GI ENDOSCOPY;  Surgeon: Eric Wilson, MD;  Location: [facility];  Service: General;;    Social History: Social History   Socioeconomic History   Marital status: Married    Spouse name: Amanda Morgan   Number of children: 2   Years of education: Not on file   Highest education level: Not on file  Occupational History   Occupation: loan technician, housing loans    Employer: [employer]  Tobacco Use   Smoking status: Never   Smokeless tobacco: Never  Vaping Use   Vaping Use: Never used  Substance and Sexual Activity   Alcohol use: No    Alcohol/week: 0.0 standard drinks    Comment: rarely   Drug use: No   Sexual activity: Yes    Birth control/protection: None    Comment: same sex couple  Other Topics Concern   Not on file  Social History Narrative   Not on file   Social Determinants of Health   Financial Resource Strain: Not on file  Food Insecurity: Not on file  Transportation Needs: Not on file  Physical Activity: Not on file  Stress: Not on file  Social Connections: Not on file  Intimate Partner Violence: Not on file    Family History: Family History  Problem Relation Age of Onset   Thyroid disease Mother    Depression Mother    Anxiety disorder Mother    Obesity Mother    Cancer Father 46       lung cancer, smoker   Alcohol abuse Father    Drug abuse Father    Liver disease Father    Diabetes Maternal Grandmother    Cancer Maternal Grandfather        prostate   Diabetes Paternal Grandmother    Hypertension Paternal Grandmother     Review of Systems: ROS Denies recent fevers, chills, or rashes.  Physical Exam: Vital Signs BP 116/73 (BP Location: Right Arm, Patient Position: Sitting, Cuff Size: Normal)   Pulse 72   Ht 5' 7" (1.702 m)   Wt 143 lb 12.8 oz (65.2 kg)   SpO2 100%   BMI 22.52 kg/m   Physical Exam Constitutional:      General: Not in acute distress.    Appearance: Normal appearance. Not ill-appearing.  HENT:     Head: Normocephalic and atraumatic.  Eyes:     Pupils: Pupils are equal, round. Cardiovascular:     Rate and Rhythm: Normal rate.    Pulses: Normal pulses.  Pulmonary:     Effort: No respiratory distress or increased work of breathing.  Speaks in full sentences. Abdominal:     General: Abdomen is flat. No distension.   Musculoskeletal: Normal range of motion. No lower extremity  swelling or edema. No varicosities.  Skin:    General: Skin is warm and dry.     Findings: No erythema or rash.  Neurological:     Mental Status: Alert and oriented to person, place, and time.  Psychiatric:        Mood and Affect: Mood normal.        Behavior: Behavior normal.    Assessment/Plan: The patient is scheduled for panniculectomy 03/12/2021 with Dr. Dillingham.  Risks, benefits, and alternatives of procedure discussed, questions answered and consent   obtained.    Smoking Status: Non-smoker.  Caprini Score: 2; Risk Factors include: Length of planned surgery. Recommendation for mechanical  prophylaxis. Encourage early ambulation.   Pictures obtained: 12/19/2020.  Post-op Rx sent to pharmacy: Norco, Zofran, Keflex.  Patient was provided with the General Surgical Risk consent document and Pain Medication Agreement prior to their appointment.  They had adequate time to read through the risk consent documents and Pain Medication Agreement. We also discussed them in person together during this preop appointment. All of their questions were answered to their satisfaction.  Recommended calling if they have any further questions.  Risk consent form and Pain Medication Agreement to be scanned into patient's chart.  The risk that can be encountered for this procedure were discussed and include the following but not limited to these: asymmetry, fluid accumulation, firmness of the tissue, skin loss, decrease or no sensation, fat necrosis, bleeding, infection, healing delay.  Deep vein thrombosis, cardiac and pulmonary complications are risks to any procedure.  There are risks of anesthesia, changes to skin sensation and injury to nerves or blood vessels.  The muscle can be temporarily or permanently injured.  You may have an allergic reaction to tape, suture, glue, blood products which can result in skin discoloration, swelling, pain, skin lesions, poor healing.  Any of these can lead to the need  for revisonal surgery or stage procedures.  Weight gain and weigh loss can also effect the long term appearance. The results are not guaranteed to last a lifetime.  Future surgery may be required.      Electronically signed by: Alonte Wulff, PA-C 02/17/2021 4:08 PM  

## 2021-02-17 NOTE — Progress Notes (Signed)
Patient ID: Leah Gilmore, female    DOB: [birth year], 35 y.o.   MRN: [id number]  Chief Complaint  Patient presents with   Pre-op Exam      ICD-10-CM   1. Panniculitis  M79.3        History of Present Illness: Leah Gilmore is a 35 y.o.  female  with a history of panniculitis.  She presents for preoperative evaluation for upcoming procedure, panniculectomy, scheduled for 03/12/2021 with Dr. Ulice Bold.  The patient has not had problems with anesthesia.  She denies any personal or family history of blood clots or clotting disorder.  No personal history of cancer.  She will hold her supplemental vitamins prior to surgery.  Denies any latex or tape allergies.  She states that she cannot take NSAIDs due to her gastric bypass.  Summary of Previous Visit: She was seen most recently for consult on 12/19/2020.  At that time, it was noted that she has had previous episodes of panniculitis.  She has been able to lose 142 pounds with assistance of Roux-en-Y gastric bypass 2018 and [facility].  She does not plan on having any additional children and is complaining of ongoing intermittent skin infections in her skin creases along with difficulty finding clothes that fit.  They discussed the fleur-de-lis approach and plan to avoid.  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  Discussed 4 weeks.  PMH Significant for: PCOS, Roux-en-Y gastric bypass 2018, panniculitis.   Past Medical History: Allergies: Allergies  Allergen Reactions   Azithromycin     hives   Food     Kiwi & Grapefruit-tongue swells    Grapefruit Extract Swelling   Kiwi Extract Swelling    Current Medications:  Current Outpatient Medications:    Calcium 500 MG tablet, Take 500 mg by mouth 3 (three) times daily., Disp: , Rfl:    cephALEXin (KEFLEX) 500 MG capsule, Take 1 capsule (500 mg total) by mouth 4 (four) times daily for 3 days., Disp: 12 capsule, Rfl:  0   HYDROcodone-acetaminophen (NORCO) 5-325 MG tablet, Take 1 tablet by mouth every 6 (six) hours as needed for up to 5 days for severe pain., Disp: 20 tablet, Rfl: 0   Multiple Vitamins-Iron (MULTIVITAMIN/IRON PO), Take 1 tablet by mouth daily., Disp: , Rfl:    ondansetron (ZOFRAN-ODT) 4 MG disintegrating tablet, Take 1 tablet (4 mg total) by mouth every 8 (eight) hours as needed for nausea or vomiting., Disp: 20 tablet, Rfl: 0   Semaglutide, 1 MG/DOSE, 4 MG/3ML SOPN, Inject 1 mg as directed once a week., Disp: 3 mL, Rfl: 0   sertraline (ZOLOFT) 50 MG tablet, Take 1.5 tablets (75 mg total) by mouth daily., Disp: 135 tablet, Rfl: 0   Vitamin D, Cholecalciferol, 50 MCG (2000 UT) CAPS, Take 2,000 Units by mouth., Disp: , Rfl:    Vitamin D, Ergocalciferol, (DRISDOL) 1.25 MG (50000 UNIT) CAPS capsule, TAKE 1 CAPSULE (50,000 UNITS TOTAL) BY MOUTH ONCE A WEEK., Disp: 4 capsule, Rfl: 0  Past Medical Problems: 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   Vitamin D deficiency     Past Surgical History: Past Surgical History:  Procedure Laterality Date   ABLATION  10/20/2020   GASTRIC ROUX-EN-Y  N/A 03/29/2016   Procedure: LAPAROSCOPIC ROUX-EN-Y GASTRIC BYPASS WITH UPPER ENDOSCOPY;  Surgeon: Gaynelle Adu, MD;  Location: [facility];  Service: General;  Laterality: N/A;   UPPER GI ENDOSCOPY  03/29/2016   Procedure: UPPER GI ENDOSCOPY;  Surgeon: Gaynelle Adu, MD;  Location: [facility];  Service: General;;    Social History: Social History   Socioeconomic History   Marital status: Married    Spouse name: Ernestine Conrad   Number of children: 2   Years of education: Not on file   Highest education level: Not on file  Occupational History   Occupation: Hydrologist, housing loans    Employer: [employer] sales [employer]  Tobacco Use   Smoking status: Never   Smokeless tobacco: Never  Vaping Use   Vaping Use: Never used  Substance and Sexual Activity   Alcohol use: No    Alcohol/week: 0.0 standard drinks    Comment: rarely   Drug use: No   Sexual activity: Yes    Birth control/protection: None    Comment: same sex couple  Other Topics Concern   Not on file  Social History Narrative   Not on file   Social Determinants of Health   Financial Resource Strain: Not on file  Food Insecurity: Not on file  Transportation Needs: Not on file  Physical Activity: Not on file  Stress: Not on file  Social Connections: Not on file  Intimate Partner Violence: Not on file    Family History: Family History  Problem Relation Age of Onset   Thyroid disease Mother    Depression Mother    Anxiety disorder Mother    Obesity Mother    Cancer Father 53       lung cancer, smoker   Alcohol abuse Father    Drug abuse Father    Liver disease Father    Diabetes Maternal Grandmother    Cancer Maternal Grandfather        prostate   Diabetes Paternal Grandmother    Hypertension Paternal Grandmother     Review of Systems: ROS Denies recent fevers, chills, or rashes.  Physical Exam: Vital Signs BP 116/73 (BP Location: Right Arm, Patient Position: Sitting, Cuff Size: Normal)   Pulse 72   Ht 5\' 7"  (1.702 m)   Wt 143 lb 12.8 oz (65.2 kg)   SpO2 100%   BMI 22.52 kg/m   Physical Exam Constitutional:      General: Not in acute distress.    Appearance: Normal appearance. Not ill-appearing.  HENT:     Head: Normocephalic and atraumatic.  Eyes:     Pupils: Pupils are equal, round. Cardiovascular:     Rate and Rhythm: Normal rate.    Pulses: Normal pulses.  Pulmonary:     Effort: No respiratory distress or increased work of breathing.  Speaks in full sentences. Abdominal:     General: Abdomen is flat. No distension.   Musculoskeletal: Normal range of motion. No lower extremity  swelling or edema. No varicosities.  Skin:    General: Skin is warm and dry.     Findings: No erythema or rash.  Neurological:     Mental Status: Alert and oriented to person, place, and time.  Psychiatric:        Mood and Affect: Mood normal.        Behavior: Behavior normal.    Assessment/Plan: The patient is scheduled for panniculectomy 03/12/2021 with Dr. 05/10/2021.  Risks, benefits, and alternatives of procedure discussed, questions answered and consent  obtained.    Smoking Status: Non-smoker.  Caprini Score: 2; Risk Factors include: Length of planned surgery. Recommendation for mechanical  prophylaxis. Encourage early ambulation.   Pictures obtained: 12/19/2020.  Post-op Rx sent to pharmacy: Norco, Zofran, Keflex.  Patient was provided with the General Surgical Risk consent document and Pain Medication Agreement prior to their appointment.  They had adequate time to read through the risk consent documents and Pain Medication Agreement. We also discussed them in person together during this preop appointment. All of their questions were answered to their satisfaction.  Recommended calling if they have any further questions.  Risk consent form and Pain Medication Agreement to be scanned into patient's chart.  The risk that can be encountered for this procedure were discussed and include the following but not limited to these: asymmetry, fluid accumulation, firmness of the tissue, skin loss, decrease or no sensation, fat necrosis, bleeding, infection, healing delay.  Deep vein thrombosis, cardiac and pulmonary complications are risks to any procedure.  There are risks of anesthesia, changes to skin sensation and injury to nerves or blood vessels.  The muscle can be temporarily or permanently injured.  You may have an allergic reaction to tape, suture, glue, blood products which can result in skin discoloration, swelling, pain, skin lesions, poor healing.  Any of these can lead to the need  for revisonal surgery or stage procedures.  Weight gain and weigh loss can also effect the long term appearance. The results are not guaranteed to last a lifetime.  Future surgery may be required.      Electronically signed by: Evelena Leyden, PA-C 02/17/2021 4:08 PM

## 2021-02-18 ENCOUNTER — Encounter (INDEPENDENT_AMBULATORY_CARE_PROVIDER_SITE_OTHER): Payer: Self-pay | Admitting: Family Medicine

## 2021-02-18 ENCOUNTER — Telehealth (INDEPENDENT_AMBULATORY_CARE_PROVIDER_SITE_OTHER): Payer: No Typology Code available for payment source | Admitting: Family Medicine

## 2021-02-18 DIAGNOSIS — E8881 Metabolic syndrome: Secondary | ICD-10-CM

## 2021-02-18 DIAGNOSIS — Z6832 Body mass index (BMI) 32.0-32.9, adult: Secondary | ICD-10-CM | POA: Diagnosis not present

## 2021-02-18 DIAGNOSIS — E669 Obesity, unspecified: Secondary | ICD-10-CM | POA: Diagnosis not present

## 2021-02-18 DIAGNOSIS — E559 Vitamin D deficiency, unspecified: Secondary | ICD-10-CM

## 2021-02-18 MED ORDER — VITAMIN D (ERGOCALCIFEROL) 1.25 MG (50000 UNIT) PO CAPS: ORAL_CAPSULE | ORAL | 0 refills | Status: DC

## 2021-02-18 MED ORDER — SEMAGLUTIDE (1 MG/DOSE) 4 MG/3ML ~~LOC~~ SOPN: 1.0000 mg | PEN_INJECTOR | SUBCUTANEOUS | 0 refills | Status: DC

## 2021-02-18 NOTE — Progress Notes (Signed)
TeleHealth Visit:  Due to the COVID-19 pandemic, this visit was completed with telemedicine (audio/video) technology to reduce patient and provider exposure as well as to preserve personal protective equipment.   [name] has verbally consented to this TeleHealth visit. The patient is located at home, the provider is located at the [facility] office. The participants in this visit include the listed provider and patient. The visit was conducted today via video.   Chief Complaint: OBESITY Leah Gilmore is here to discuss her progress with her obesity treatment plan along with follow-up of her obesity related diagnoses. Leah Gilmore is on following a lower carbohydrate, vegetable and lean protein rich diet plan and states she is following her eating plan approximately 30% of the time. Leah Gilmore states she is working with a trainer 30 minutes 3 times per week.  Today's visit was #: 28 Starting weight: 209 lbs Starting date: 04/30/2019  Interim History: Leah Gilmore went to [place] to see her mom, took in a new baby to foster, and has been living life. She got approved for panniculectomy on January 5th. She hasn't been as strict on low carb but otherwise, just focuses on nutritional validity.  Subjective:   1. Vitamin D deficiency Pt denies nausea, vomiting, and muscle weakness but notes fatigue. She is on prescription Vit D.  2. Insulin resistance Allana is on Ozempic 1 mg. She is doing well on GLP-1 with no GI side effects.  Assessment/Plan:   1. Vitamin D deficiency Low Vitamin D level contributes to fatigue and are associated with obesity, breast, and colon cancer. She agrees to continue to take prescription Vitamin D 50,000 IU every week and will follow-up for routine testing of Vitamin D, at least 2-3 times per year to avoid over-replacement.  Refill- Vitamin D, Ergocalciferol, (DRISDOL) 1.25 MG (50000 UNIT) CAPS capsule; TAKE 1 CAPSULE (50,000 UNITS TOTAL) BY MOUTH ONCE A WEEK.  Dispense: 4 capsule;  Refill: 0  2. Insulin resistance Leah Gilmore will continue to work on weight loss, exercise, and decreasing simple carbohydrates to help decrease the risk of diabetes. Leah Gilmore agreed to follow-up with [name] as directed to closely monitor her progress.  Refill- Semaglutide, 1 MG/DOSE, 4 MG/3ML SOPN; Inject 1 mg as directed once a week.  Dispense: 9 mL; Refill: 0  3. Obesity with current BMI of 21.7  Leah Gilmore is currently in the action stage of change. As such, her goal is to continue with weight loss efforts. She has agreed to following a lower carbohydrate, vegetable and lean protein rich diet plan.   Exercise goals: No exercise has been prescribed at this time.  Behavioral modification strategies: increasing lean protein intake, meal planning and cooking strategies, keeping healthy foods in the home, and planning for success.  Leah Gilmore has agreed to follow-up with our clinic in 6 weeks. She was informed of the importance of frequent follow-up visits to maximize her success with intensive lifestyle modifications for her multiple health conditions.  Objective:   VITALS: Per patient if applicable, see vitals. GENERAL: Alert and in no acute distress. CARDIOPULMONARY: No increased WOB. Speaking in clear sentences.  PSYCH: Pleasant and cooperative. Speech normal rate and rhythm. Affect is appropriate. Insight and judgement are appropriate. Attention is focused, linear, and appropriate.  NEURO: Oriented as arrived to appointment on time with no prompting.   Lab Results  Component Value Date   CREATININE 0.74 03/19/2020   BUN 12 03/19/2020   NA 139 03/19/2020   K 4.2 03/19/2020   CL 106 03/19/2020  CO2 25 03/19/2020   Lab Results  Component Value Date   ALT 11 03/19/2020   AST 19 03/19/2020   ALKPHOS 53 03/19/2020   BILITOT 1.1 03/19/2020   Lab Results  Component Value Date   HGBA1C 4.8 05/01/2019   HGBA1C 4.8 04/30/2019   No results found for: INSULIN Lab Results  Component Value Date   TSH 2.14  04/17/2019   Lab Results  Component Value Date   CHOL 168 04/17/2019   HDL 71 04/17/2019   LDLCALC 82 04/17/2019   TRIG 74 04/17/2019   CHOLHDL 2.4 04/17/2019   Lab Results  Component Value Date   VD25OH 37 03/19/2020   VD25OH 18 (L) 04/17/2019   VD25OH 23.13 (L) 03/16/2018   Lab Results  Component Value Date   WBC 3.0 (L) 11/27/2020   HGB 10.2 (L) 11/27/2020   HCT 32.1 (L) 11/27/2020   MCV 92.5 11/27/2020   PLT 241 11/27/2020   Lab Results  Component Value Date   IRON 38 (L) 11/27/2020   TIBC 302 11/27/2020   FERRITIN 49 11/27/2020    Attestation Statements:   Reviewed by clinician on day of visit: allergies, medications, problem list, medical history, surgical history, family history, social history, and previous encounter notes.  Edmund Hilda, CMA, am acting as transcriptionist for Reuben Likes, MD.  I have reviewed the above documentation for accuracy and completeness, and I agree with the above. - Reuben Likes, MD

## 2021-02-26 ENCOUNTER — Encounter (HOSPITAL_BASED_OUTPATIENT_CLINIC_OR_DEPARTMENT_OTHER): Payer: Self-pay | Admitting: Plastic Surgery

## 2021-02-26 ENCOUNTER — Other Ambulatory Visit: Payer: Self-pay

## 2021-03-11 NOTE — Progress Notes (Signed)
Sent text reminding pt to come in for lab work.  

## 2021-03-12 ENCOUNTER — Ambulatory Visit (HOSPITAL_BASED_OUTPATIENT_CLINIC_OR_DEPARTMENT_OTHER): Payer: No Typology Code available for payment source | Admitting: Anesthesiology

## 2021-03-12 ENCOUNTER — Other Ambulatory Visit: Payer: Self-pay

## 2021-03-12 ENCOUNTER — Encounter (HOSPITAL_BASED_OUTPATIENT_CLINIC_OR_DEPARTMENT_OTHER)
Admission: RE | Disposition: A | Discharge status: Home / Self Care | Payer: Self-pay | Source: Home / Self Care | Attending: Plastic Surgery

## 2021-03-12 ENCOUNTER — Observation Stay (HOSPITAL_BASED_OUTPATIENT_CLINIC_OR_DEPARTMENT_OTHER)
Admission: RE | Admit: 2021-03-12 | Discharge: 2021-03-12 | Disposition: A | Discharge status: Home / Self Care | Payer: No Typology Code available for payment source | Attending: Plastic Surgery | Admitting: Plastic Surgery

## 2021-03-12 ENCOUNTER — Encounter (HOSPITAL_BASED_OUTPATIENT_CLINIC_OR_DEPARTMENT_OTHER): Payer: Self-pay | Admitting: Plastic Surgery

## 2021-03-12 DIAGNOSIS — R7303 Prediabetes: Secondary | ICD-10-CM

## 2021-03-12 DIAGNOSIS — L089 Local infection of the skin and subcutaneous tissue, unspecified: Secondary | ICD-10-CM | POA: Insufficient documentation

## 2021-03-12 DIAGNOSIS — Z9884 Bariatric surgery status: Secondary | ICD-10-CM | POA: Diagnosis not present

## 2021-03-12 DIAGNOSIS — M793 Panniculitis, unspecified: Secondary | ICD-10-CM

## 2021-03-12 DIAGNOSIS — Z79899 Other long term (current) drug therapy: Secondary | ICD-10-CM | POA: Diagnosis not present

## 2021-03-12 DIAGNOSIS — Z8616 Personal history of COVID-19: Secondary | ICD-10-CM | POA: Insufficient documentation

## 2021-03-12 DIAGNOSIS — M546 Pain in thoracic spine: Secondary | ICD-10-CM

## 2021-03-12 DIAGNOSIS — G8929 Other chronic pain: Secondary | ICD-10-CM

## 2021-03-12 HISTORY — PX: PANNICULECTOMY: SHX5360

## 2021-03-12 LAB — POCT PREGNANCY, URINE: Preg Test, Ur: NEGATIVE

## 2021-03-12 SURGERY — PANNICULECTOMY
Anesthesia: General | Site: Abdomen

## 2021-03-12 MED ORDER — DEXAMETHASONE SODIUM PHOSPHATE 10 MG/ML IJ SOLN
INTRAMUSCULAR | Status: AC
  Filled 2021-03-12: qty 1

## 2021-03-12 MED ORDER — LIDOCAINE-EPINEPHRINE 1 %-1:100000 IJ SOLN
INTRAMUSCULAR | Status: DC | PRN
  Administered 2021-03-12: 20 mL via INTRADERMAL

## 2021-03-12 MED ORDER — SUFENTANIL CITRATE 50 MCG/ML IV SOLN
INTRAVENOUS | Status: DC | PRN
Start: 2021-03-12 — End: 2021-03-12
  Administered 2021-03-12: 20 ug via INTRAVENOUS

## 2021-03-12 MED ORDER — KCL IN DEXTROSE-NACL 20-5-0.45 MEQ/L-%-% IV SOLN: INTRAVENOUS | Status: DC

## 2021-03-12 MED ORDER — OXYCODONE HCL 5 MG/5ML PO SOLN: 5.0000 mg | Freq: Once | ORAL | Status: AC | PRN

## 2021-03-12 MED ORDER — DIAZEPAM 2 MG PO TABS: 2.0000 mg | ORAL_TABLET | Freq: Two times a day (BID) | ORAL | Status: DC | PRN

## 2021-03-12 MED ORDER — ROCURONIUM BROMIDE 10 MG/ML (PF) SYRINGE
PREFILLED_SYRINGE | INTRAVENOUS | Status: AC
  Filled 2021-03-12: qty 10

## 2021-03-12 MED ORDER — DEXAMETHASONE SODIUM PHOSPHATE 4 MG/ML IJ SOLN
INTRAMUSCULAR | Status: DC | PRN
  Administered 2021-03-12: 10 mg via INTRAVENOUS

## 2021-03-12 MED ORDER — ONDANSETRON 4 MG PO TBDP: 4.0000 mg | ORAL_TABLET | Freq: Four times a day (QID) | ORAL | Status: DC | PRN

## 2021-03-12 MED ORDER — OXYCODONE HCL 5 MG PO TABS
5.0000 mg | ORAL_TABLET | Freq: Once | ORAL | Status: AC | PRN
  Administered 2021-03-12: 5 mg via ORAL

## 2021-03-12 MED ORDER — CEFAZOLIN SODIUM-DEXTROSE 2-4 GM/100ML-% IV SOLN
INTRAVENOUS | Status: AC
  Filled 2021-03-12: qty 100

## 2021-03-12 MED ORDER — LIDOCAINE 2% (20 MG/ML) 5 ML SYRINGE
INTRAMUSCULAR | Status: AC
  Filled 2021-03-12: qty 5

## 2021-03-12 MED ORDER — EPHEDRINE 5 MG/ML INJ
INTRAVENOUS | Status: AC
  Filled 2021-03-12: qty 5

## 2021-03-12 MED ORDER — LACTATED RINGERS IV SOLN: INTRAVENOUS | Status: DC

## 2021-03-12 MED ORDER — KETOROLAC TROMETHAMINE 30 MG/ML IJ SOLN: 30.0000 mg | Freq: Once | INTRAMUSCULAR | Status: DC | PRN

## 2021-03-12 MED ORDER — MIDAZOLAM HCL 5 MG/5ML IJ SOLN
INTRAMUSCULAR | Status: DC | PRN
  Administered 2021-03-12: 2 mg via INTRAVENOUS

## 2021-03-12 MED ORDER — SUGAMMADEX SODIUM 200 MG/2ML IV SOLN
INTRAVENOUS | Status: DC | PRN
  Administered 2021-03-12: 200 mg via INTRAVENOUS

## 2021-03-12 MED ORDER — SENNA 8.6 MG PO TABS: 1.0000 | ORAL_TABLET | Freq: Two times a day (BID) | ORAL | Status: DC

## 2021-03-12 MED ORDER — LIDOCAINE HCL (CARDIAC) PF 100 MG/5ML IV SOSY
PREFILLED_SYRINGE | INTRAVENOUS | Status: DC | PRN
Start: 2021-03-12 — End: 2021-03-12
  Administered 2021-03-12: 60 mg via INTRAVENOUS
  Administered 2021-03-12: 100 mg via INTRAVENOUS

## 2021-03-12 MED ORDER — ONDANSETRON HCL 4 MG/2ML IJ SOLN
INTRAMUSCULAR | Status: AC
  Filled 2021-03-12: qty 2

## 2021-03-12 MED ORDER — DIPHENHYDRAMINE HCL 50 MG/ML IJ SOLN: 12.5000 mg | Freq: Four times a day (QID) | INTRAMUSCULAR | Status: DC | PRN

## 2021-03-12 MED ORDER — EPHEDRINE SULFATE 50 MG/ML IJ SOLN
INTRAMUSCULAR | Status: DC | PRN
  Administered 2021-03-12: 10 mg via INTRAVENOUS
  Administered 2021-03-12: 15 mg via INTRAVENOUS

## 2021-03-12 MED ORDER — PHENYLEPHRINE 40 MCG/ML (10ML) SYRINGE FOR IV PUSH (FOR BLOOD PRESSURE SUPPORT)
PREFILLED_SYRINGE | INTRAVENOUS | Status: AC
  Filled 2021-03-12: qty 10

## 2021-03-12 MED ORDER — SUFENTANIL CITRATE 50 MCG/ML IV SOLN
INTRAVENOUS | Status: AC
  Filled 2021-03-12: qty 1

## 2021-03-12 MED ORDER — POLYETHYLENE GLYCOL 3350 17 G PO PACK: 17.0000 g | PACK | Freq: Every day | ORAL | Status: DC | PRN

## 2021-03-12 MED ORDER — HYDROMORPHONE HCL 1 MG/ML IJ SOLN
INTRAMUSCULAR | Status: AC
  Filled 2021-03-12: qty 0.5

## 2021-03-12 MED ORDER — PROMETHAZINE HCL 25 MG/ML IJ SOLN: 6.2500 mg | INTRAMUSCULAR | Status: DC | PRN

## 2021-03-12 MED ORDER — HYDROMORPHONE HCL 1 MG/ML IJ SOLN: 1.0000 mg | INTRAMUSCULAR | Status: DC | PRN

## 2021-03-12 MED ORDER — OXYCODONE HCL 5 MG PO TABS
ORAL_TABLET | ORAL | Status: AC
  Filled 2021-03-12: qty 1

## 2021-03-12 MED ORDER — ONDANSETRON HCL 4 MG/2ML IJ SOLN: 4.0000 mg | Freq: Four times a day (QID) | INTRAMUSCULAR | Status: DC | PRN

## 2021-03-12 MED ORDER — MIDAZOLAM HCL 2 MG/2ML IJ SOLN
INTRAMUSCULAR | Status: AC
  Filled 2021-03-12: qty 2

## 2021-03-12 MED ORDER — CEFAZOLIN SODIUM-DEXTROSE 2-4 GM/100ML-% IV SOLN: 2.0000 g | Freq: Three times a day (TID) | INTRAVENOUS | Status: DC

## 2021-03-12 MED ORDER — ACETAMINOPHEN 325 MG PO TABS: 325.0000 mg | ORAL_TABLET | Freq: Four times a day (QID) | ORAL | Status: DC

## 2021-03-12 MED ORDER — ROCURONIUM BROMIDE 100 MG/10ML IV SOLN
INTRAVENOUS | Status: DC | PRN
  Administered 2021-03-12: 50 mg via INTRAVENOUS

## 2021-03-12 MED ORDER — CHLORHEXIDINE GLUCONATE CLOTH 2 % EX PADS: 6.0000 | MEDICATED_PAD | Freq: Once | CUTANEOUS | Status: DC

## 2021-03-12 MED ORDER — IBUPROFEN 200 MG PO TABS: 400.0000 mg | ORAL_TABLET | Freq: Four times a day (QID) | ORAL | Status: DC

## 2021-03-12 MED ORDER — HYDROMORPHONE HCL 1 MG/ML IJ SOLN: 0.2500 mg | INTRAMUSCULAR | Status: DC | PRN

## 2021-03-12 MED ORDER — DROPERIDOL 2.5 MG/ML IJ SOLN
INTRAMUSCULAR | Status: DC | PRN
  Administered 2021-03-12: .625 mg via INTRAVENOUS

## 2021-03-12 MED ORDER — CEFAZOLIN SODIUM-DEXTROSE 2-4 GM/100ML-% IV SOLN
2.0000 g | INTRAVENOUS | Status: AC
  Administered 2021-03-12: 2 g via INTRAVENOUS

## 2021-03-12 MED ORDER — HYDROCODONE-ACETAMINOPHEN 5-325 MG PO TABS: 1.0000 | ORAL_TABLET | ORAL | Status: DC | PRN

## 2021-03-12 MED ORDER — DIPHENHYDRAMINE HCL 12.5 MG/5ML PO ELIX: 12.5000 mg | ORAL_SOLUTION | Freq: Four times a day (QID) | ORAL | Status: DC | PRN

## 2021-03-12 MED ORDER — ATROPINE SULFATE 0.4 MG/ML IV SOLN
INTRAVENOUS | Status: AC
  Filled 2021-03-12: qty 1

## 2021-03-12 MED ORDER — PROPOFOL 10 MG/ML IV BOLUS
INTRAVENOUS | Status: DC | PRN
  Administered 2021-03-12: 120 mg via INTRAVENOUS

## 2021-03-12 MED ORDER — SUCCINYLCHOLINE CHLORIDE 200 MG/10ML IV SOSY
PREFILLED_SYRINGE | INTRAVENOUS | Status: AC
  Filled 2021-03-12: qty 10

## 2021-03-12 SURGICAL SUPPLY — 60 items
ADH SKN CLS APL DERMABOND .7 (GAUZE/BANDAGES/DRESSINGS) ×2
APPLIER CLIP 9.375 MED OPEN (MISCELLANEOUS)
APR CLP MED 9.3 20 MLT OPN (MISCELLANEOUS)
BAG DECANTER FOR FLEXI CONT (MISCELLANEOUS) IMPLANT
BINDER ABDOMINAL 12 SM 30-45 (SOFTGOODS) ×2 IMPLANT
BIOPATCH RED 1 DISK 7.0 (GAUZE/BANDAGES/DRESSINGS) ×2 IMPLANT
BLADE CLIPPER SURG (BLADE) ×2 IMPLANT
BLADE HEX COATED 2.75 (ELECTRODE) ×2 IMPLANT
BLADE SURG 10 STRL SS (BLADE) ×2 IMPLANT
BLADE SURG 15 STRL LF DISP TIS (BLADE) ×2 IMPLANT
BLADE SURG 15 STRL SS (BLADE) ×4
CANISTER SUCT 1200ML W/VALVE (MISCELLANEOUS) ×2 IMPLANT
CLIP APPLIE 9.375 MED OPEN (MISCELLANEOUS) IMPLANT
DERMABOND ADVANCED (GAUZE/BANDAGES/DRESSINGS) ×2
DERMABOND ADVANCED .7 DNX12 (GAUZE/BANDAGES/DRESSINGS) ×2 IMPLANT
DRAIN CHANNEL 19F RND (DRAIN) ×2 IMPLANT
DRSG OPSITE POSTOP 4X12 (GAUZE/BANDAGES/DRESSINGS) ×2 IMPLANT
DRSG PAD ABDOMINAL 8X10 ST (GAUZE/BANDAGES/DRESSINGS) ×8 IMPLANT
ELECT BLADE 4.0 EZ CLEAN MEGAD (MISCELLANEOUS)
ELECT REM PT RETURN 9FT ADLT (ELECTROSURGICAL) ×2
ELECTRODE BLDE 4.0 EZ CLN MEGD (MISCELLANEOUS) IMPLANT
ELECTRODE REM PT RTRN 9FT ADLT (ELECTROSURGICAL) ×1 IMPLANT
EVACUATOR SILICONE 100CC (DRAIN) IMPLANT
GAUZE SPONGE 4X4 12PLY STRL (GAUZE/BANDAGES/DRESSINGS) IMPLANT
GAUZE XEROFORM 1X8 LF (GAUZE/BANDAGES/DRESSINGS) ×1 IMPLANT
GLOVE SURG ENC MOIS LTX SZ6.5 (GLOVE) ×6 IMPLANT
GLOVE SURG ENC MOIS LTX SZ7 (GLOVE) ×2 IMPLANT
GOWN STRL REUS W/ TWL LRG LVL3 (GOWN DISPOSABLE) ×3 IMPLANT
GOWN STRL REUS W/TWL LRG LVL3 (GOWN DISPOSABLE) ×6
LINER CANISTER 1000CC FLEX (MISCELLANEOUS) IMPLANT
NDL HYPO 25X1 1.5 SAFETY (NEEDLE) IMPLANT
NDL SAFETY ECLIPSE 18X1.5 (NEEDLE) IMPLANT
NEEDLE HYPO 18GX1.5 SHARP (NEEDLE)
NEEDLE HYPO 25X1 1.5 SAFETY (NEEDLE) IMPLANT
NS IRRIG 1000ML POUR BTL (IV SOLUTION) ×2 IMPLANT
PACK BASIN DAY SURGERY FS (CUSTOM PROCEDURE TRAY) ×2 IMPLANT
PENCIL SMOKE EVACUATOR (MISCELLANEOUS) ×2 IMPLANT
PIN SAFETY STERILE (MISCELLANEOUS) ×2 IMPLANT
SLEEVE SCD COMPRESS KNEE MED (STOCKING) ×2 IMPLANT
SPONGE T-LAP 18X18 ~~LOC~~+RFID (SPONGE) ×4 IMPLANT
STRIP SUTURE WOUND CLOSURE 1/2 (MISCELLANEOUS) IMPLANT
SUT MNCRL AB 4-0 PS2 18 (SUTURE) ×8 IMPLANT
SUT MON AB 3-0 SH 27 (SUTURE) ×8
SUT MON AB 3-0 SH27 (SUTURE) ×6 IMPLANT
SUT MON AB 5-0 PS2 18 (SUTURE) ×2 IMPLANT
SUT PDS 3-0 CT2 (SUTURE) ×8
SUT PDS II 3-0 CT2 27 ABS (SUTURE) ×5 IMPLANT
SUT SILK 2 0 SH (SUTURE) ×2 IMPLANT
SUT VICRYL 4-0 PS2 18IN ABS (SUTURE) ×4 IMPLANT
SYR 50ML LL SCALE MARK (SYRINGE) IMPLANT
SYR BULB IRRIG 60ML STRL (SYRINGE) ×2 IMPLANT
SYR CONTROL 10ML LL (SYRINGE) IMPLANT
SYR TB 1ML LL NO SAFETY (SYRINGE) IMPLANT
TOWEL GREEN STERILE FF (TOWEL DISPOSABLE) ×4 IMPLANT
TRAY DSU PREP LF (CUSTOM PROCEDURE TRAY) ×2 IMPLANT
TUBE CONNECTING 20X1/4 (TUBING) ×2 IMPLANT
TUBING INFILTRATION IT-10001 (TUBING) IMPLANT
TUBING SET GRADUATE ASPIR 12FT (MISCELLANEOUS) IMPLANT
UNDERPAD 30X36 HEAVY ABSORB (UNDERPADS AND DIAPERS) ×4 IMPLANT
YANKAUER SUCT BULB TIP NO VENT (SUCTIONS) ×2 IMPLANT

## 2021-03-12 NOTE — Anesthesia Preprocedure Evaluation (Signed)
Anesthesia Evaluation  Patient identified by MRN, date of birth, ID band Patient awake    Reviewed: Allergy & Precautions, NPO status , Patient's Chart, lab work & pertinent test results  Airway Mallampati: II  TM Distance: >3 FB Neck ROM: Full    Dental no notable dental hx.    Pulmonary neg pulmonary ROS,    Pulmonary exam normal breath sounds clear to auscultation       Cardiovascular negative cardio ROS Normal cardiovascular exam Rhythm:Regular Rate:Normal     Neuro/Psych negative neurological ROS  negative psych ROS   GI/Hepatic negative GI ROS, Neg liver ROS,   Endo/Other  negative endocrine ROSPCOS (polycystic ovarian syndrome)  Renal/GU negative Renal ROS  negative genitourinary   Musculoskeletal negative musculoskeletal ROS (+)   Abdominal   Peds negative pediatric ROS (+)  Hematology negative hematology ROS (+)   Anesthesia Other Findings   Reproductive/Obstetrics negative OB ROS                             Anesthesia Physical Anesthesia Plan  ASA: 2  Anesthesia Plan: General   Post-op Pain Management:    Induction: Intravenous  PONV Risk Score and Plan: 3 and Ondansetron, Dexamethasone, Midazolam, Treatment may vary due to age or medical condition and Droperidol  Airway Management Planned: Oral ETT  Additional Equipment:   Intra-op Plan:   Post-operative Plan: Extubation in OR  Informed Consent: I have reviewed the patients History and Physical, chart, labs and discussed the procedure including the risks, benefits and alternatives for the proposed anesthesia with the patient or authorized representative who has indicated his/her understanding and acceptance.     Dental advisory given  Plan Discussed with: CRNA and Surgeon  Anesthesia Plan Comments:         Anesthesia Quick Evaluation

## 2021-03-12 NOTE — Anesthesia Procedure Notes (Signed)
Procedure Name: Intubation Date/Time: 03/12/2021 11:12 AM Performed by: Willa Frater, CRNA Pre-anesthesia Checklist: Patient identified, Emergency Drugs available, Suction available and Patient being monitored Patient Re-evaluated:Patient Re-evaluated prior to induction Oxygen Delivery Method: Circle system utilized Preoxygenation: Pre-oxygenation with 100% oxygen Induction Type: IV induction Ventilation: Mask ventilation without difficulty Grade View: Grade I Tube type: Oral Tube size: 7.0 mm Number of attempts: 1 Airway Equipment and Method: Stylet and Oral airway Placement Confirmation: ETT inserted through vocal cords under direct vision, positive ETCO2 and breath sounds checked- equal and bilateral Secured at: 22 cm Tube secured with: Tape Dental Injury: Teeth and Oropharynx as per pre-operative assessment

## 2021-03-12 NOTE — Interval H&P Note (Signed)
History and Physical Interval Note:  03/12/2021 10:05 AM  Leah Gilmore  has presented today for surgery, with the diagnosis of Panniculitis.  The various methods of treatment have been discussed with the patient and family. After consideration of risks, benefits and other options for treatment, the patient has consented to  Procedure(s) with comments: PANNICULECTOMY (N/A) - 3.5 hours as a surgical intervention.  The patient's history has been reviewed, patient examined, no change in status, stable for surgery.  I have reviewed the patient's chart and labs.  Questions were answered to the patient's satisfaction.     [name] [name]

## 2021-03-12 NOTE — Discharge Instructions (Addendum)
INSTRUCTIONS FOR AFTER ABDOMINAL SURGERY  You will likely have some questions about what to expect following your operation.  The following information will help you and your family understand what to expect when you get home.  Following these guidelines will help ensure a smooth recovery and reduce risks of complications.  Postoperative instructions include information on: diet, wound care, medications and physical activity.  AFTER SURGERY Expect to go home after the procedure.  In some cases, you may need to spend one night in the hospital for observation.  DIET This surgery does not require a specific diet.  However, the healthier you eat the better your body can heal. It is important to increasing your protein intake.  Limit foods with high sugar and  carbohydrate content.  Focus on vegetables, meat and other protein sources if you are vegan or vegetarian.  If you undergo liposuction during your procedure it is very important to drink 8 oz of water every hour while awake for 2 days.  If your urine is bright yellow, then it is concentrated, and you need to drink more water.  If you find you are persistently nauseated or unable to take in liquids let us know.  NO TOBACCO USE or EXPOSURE.  This will slow your healing process and increase the risk of a wound.  WOUND CARE Leave the abdominal binder in place for 3 days.  Then you can remove it and shower.  Replace the binder or spanx after your shower.   You may have Topifoam or Lipofoam on.  It is soft and spongy and helps keep you from getting creases if you have liposuction.  This can be removed before the shower and then replaced.  If you need more it is available on [place] as lipofoam.  If you have steri-strips / tape directly attached to your skin leave them in place. It is OK to get these wet.  No baths, pools or hot tubs for four weeks. We close your incision to leave the smallest and best-looking scar. No ointment or creams on your incisions  until cleared by your surgeon.  No Neosporin (Too many skin reactions with this one).  After the steri-strips are off can use Mederma or Skinuva and start massaging the scar. Continue to wear the binder/spanx or Ace wrap around the clock, including while sleeping, for 6 weeks. This provides added comfort and helps reduce the fluid accumulation at the surgery site.  ACTIVITY No heavy lifting until cleared by the doctor.  For example, no more than a half-gallon of milk.  It is OK to walk and you are encouraged to move your legs to help decrease your risk of getting a blood clot.  It will also help keep you from getting deconditioned.  Every 1 to 2 hours get up and walk for 5 minutes. This will help with a quicker recovery back to normal.  Let pain be your guide so you don't do too much.     SLEEPING / RESTING Sleeping and resting should be in the jack-knife or bent forward position with your head elevated.  This will help reduce pulling on your abdominal incision.  You can elevate your head and upper back with a few pillows and place a pillow under your knees.  Avoid stomach sleeping for 3 months.   WORK Everyone returns to work at different times. As a rough guide, most people take 1 - 2 weeks off prior to returning to work. If you need documentation for your  job give them to the front staff for processing.  DRIVING Arrange for someone to bring you home from the hospital.  You may be able to drive a few days after surgery but not while taking any narcotics or valium.  This is for your safety as well as others sharing the road with you.  BOWEL MOVEMENTS Constipation can occur after anesthesia and while taking pain medication.  It is important to stay ahead for your comfort.  We recommend taking Milk of Magnesia (2 tablespoons; twice a day) while taking the pain pills.  MEDICATIONS (you may receive and should be started after surgery) At your preoperative visit for you history and physical you were  given the following medications: Antibiotic: Start this medication when you get home and take according to the instructions on the bottle. Zofran 4 mg:  This is to treat nausea and vomiting.  You can take this every 6 hours as needed and only if needed. Norco (hydrocodone/acetaminophen) 5/325 mg:  This is only to be used after you have taken the Motrin or the Tylenol. Every 8 hours as needed.  Over the counter Medication to take: Ibuprofen (Motrin) 600 mg:  Take this every 6 hours.  If you have additional pain then take 500 mg of the Tylenol.  Only take the Norco after you have tried these two. MiraLAX or stool softener of choice: Take this according to the bottle if you take the Norco.  WHEN TO CALL Call your surgeon's office if any of the following occur:  Fever 101 degrees F or greater  Excessive bleeding or fluid from the incision site.  Pain that increases over time without aid from the medications  Redness, warmth, or pus draining from incision sites  Persistent nausea or inability to take in liquids  Severe misshapen area that underwent the operation.   Post Anesthesia Home Care Instructions  Activity: Get plenty of rest for the remainder of the day. A responsible individual must stay with you for 24 hours following the procedure.  For the next 24 hours, DO NOT: -Drive a car -Advertising copywriter -Drink alcoholic beverages -Take any medication unless instructed by your physician -Make any legal decisions or sign important papers.  Meals: Start with liquid foods such as gelatin or soup. Progress to regular foods as tolerated. Avoid greasy, spicy, heavy foods. If nausea and/or vomiting occur, drink only clear liquids until the nausea and/or vomiting subsides. Call your physician if vomiting continues.  Special Instructions/Symptoms: Your throat may feel dry or sore from the anesthesia or the breathing tube placed in your throat during surgery. If this causes discomfort, gargle  with warm salt water. The discomfort should disappear within 24 hours.  If you had a scopolamine patch placed behind your ear for the management of post- operative nausea and/or vomiting:  1. The medication in the patch is effective for 72 hours, after which it should be removed.  Wrap patch in a tissue and discard in the trash. Wash hands thoroughly with soap and water. 2. You may remove the patch earlier than 72 hours if you experience unpleasant side effects which may include dry mouth, dizziness or visual disturbances. 3. Avoid touching the patch. Wash your hands with soap and water after contact with the patch.    About my Jackson-Pratt Bulb Drain  What is a Jackson-Pratt bulb? A Jackson-Pratt is a soft, round device used to collect drainage. It is connected to a long, thin drainage catheter, which is held in place by  one or two small stiches near your surgical incision site. When the bulb is squeezed, it forms a vacuum, forcing the drainage to empty into the bulb.  Emptying the Jackson-Pratt bulb- To empty the bulb: 1. Release the plug on the top of the bulb. 2. Pour the bulb's contents into a measuring container which your nurse will provide. 3. Record the time emptied and amount of drainage. Empty the drain(s) as often as your     doctor or nurse recommends.  Date                  Time                    Amount (Drain 1)                 Amount (Drain  2)  _____________________________________________________________________  _____________________________________________________________________  _____________________________________________________________________  _____________________________________________________________________  _____________________________________________________________________  _____________________________________________________________________  _____________________________________________________________________  _____________________________________________________________________  Squeezing the Jackson-Pratt Bulb- To squeeze the bulb: 1. Make sure the plug at the top of the bulb is open. 2. Squeeze the bulb tightly in your fist. You will hear air squeezing from the bulb. 3. Replace the plug while the bulb is squeezed. 4. Use a safety pin to attach the bulb to your clothing. This will keep the catheter from     pulling at the bulb insertion site.  When to call your doctor- Call your doctor if: Drain site becomes red, swollen or hot. You have a fever greater than 101 degrees F. There is oozing at the drain site. Drain falls out (apply a guaze bandage over the drain hole and secure it with tape). Drainage increases daily not related to activity patterns. (You will usually have more drainage when you are active than when you are resting.) Drainage has a bad odor.

## 2021-03-12 NOTE — Op Note (Signed)
Operative Report  Date of operation: 03/12/2021  Patient: Leah Gilmore, MRN: [id number], 36 y.o. female.   Date of birth: June 19, 1985  Location: [facility]  Preoperative Diagnosis: Panniculitis  Postoperative Diagnosis:  Same  Procedure: Panniculectomy  Surgeon:  Theodoro Kos Nayelly Laughman  Assistant:  Lennice Sites, MD, Roetta Sessions, MD  Anesthesia:  General  EBL:  150cc  Drains:  2 number 55 blake round drains  Condition:  Stable  Complications: None  Disposition: Recovery Room  Procedure in Detail: Patient was seen the morning of her surgery and marked out for the procedure. She was then given an IV and IV antibiotics. The patient was taken to the operating room and underwent general anesthesia.  A time out was called and all information was confirmed to be correct. SCD's and a pillow under the knees was in place. The patient was then prepped and draped in the standard sterile fashion. Local was placed into the incision.  The planned lower incision was then incised and the incision taken down through the Scarpa's fascia to the rectus abdominus fascia. The skin and subcutaneous tissue was then lifted off the fascia up to the level of the umbilicus. The umbilicus is then circumscribed and freed up from the surrounding skin and freed down to the abdominal wall. The abdominal wall was then freed above the umbilicus but not widely to above the umbilicus.  The patient was then flexed on the table and the amount that could be excised was confirmed. The pannus was excised and it weighed 1100 grams. The mons was also suspended with 3-0 Monocryl.  Two #19 blake round drains were placed and secured with 3-0 Silk.  The new position of the umbilicus was determined and a small v shaped incision placed in the abdominal wall. The umbilicus was secured with 4- 4.0 Vicryl at 12, 3,6, and 9 o'clock. It was inset from the dermis to the abdominal wall and then run  closed with 4-0 Monocryl in a subcuticular fashion. The abdominal wall was closed with buried 3-0 PDS and Monocryl and subcuticular 4-0 Monocryl.    The wound was then dressed with dermabond. ABD's and an abdominal binder were placed. Patient was allowed to wake up, extubated and taken on a stretcher in the flexed position to the recovery room. Family was notified at the end of the case.   The advanced practice practitioner (APP) assisted throughout the case.  The APP was essential in retraction and counter traction when needed to make the case progress smoothly.  This retraction and assistance made it possible to see the tissue plans for the procedure.  The assistance was needed for blood control, tissue re-approximation and assisted with closure of the incision site.

## 2021-03-12 NOTE — Transfer of Care (Signed)
Immediate Anesthesia Transfer of Care Note  Patient: Leah Gilmore  Procedure(s) Performed: PANNICULECTOMY (Abdomen)  Patient Location: PACU  Anesthesia Type:General  Level of Consciousness: awake, alert , oriented, drowsy and patient cooperative  Airway & Oxygen Therapy: Patient Spontanous Breathing and Patient connected to face mask oxygen  Post-op Assessment: Report given to RN and Post -op Vital signs reviewed and stable  Post vital signs: Reviewed and stable  Last Vitals:  Vitals Value Taken Time  BP 119/66 03/12/21 1258  Temp    Pulse 92 03/12/21 1258  Resp    SpO2 100 % 03/12/21 1258  Vitals shown include unvalidated device data.  Last Pain:  Vitals:   03/12/21 0956  TempSrc: Oral  PainSc: 0-No pain         Complications: No notable events documented.

## 2021-03-12 NOTE — Anesthesia Postprocedure Evaluation (Signed)
Anesthesia Post Note  Patient: Leah Gilmore  Procedure(s) Performed: PANNICULECTOMY (Abdomen)     Patient location during evaluation: PACU Anesthesia Type: General Level of consciousness: awake and alert Pain management: pain level controlled Vital Signs Assessment: post-procedure vital signs reviewed and stable Respiratory status: spontaneous breathing, nonlabored ventilation, respiratory function stable and patient connected to nasal cannula oxygen Cardiovascular status: blood pressure returned to baseline and stable Postop Assessment: no apparent nausea or vomiting Anesthetic complications: no   No notable events documented.  Last Vitals:  Vitals:   03/12/21 1315 03/12/21 1330  BP: 126/74 123/71  Pulse: 88 71  Resp: 14 14  Temp:    SpO2: 100% 100%    Last Pain:  Vitals:   03/12/21 1330  TempSrc:   PainSc: 4                  [name] S

## 2021-03-13 ENCOUNTER — Encounter (HOSPITAL_BASED_OUTPATIENT_CLINIC_OR_DEPARTMENT_OTHER): Payer: Self-pay | Admitting: Plastic Surgery

## 2021-03-13 ENCOUNTER — Telehealth: Payer: Self-pay | Admitting: Plastic Surgery

## 2021-03-13 NOTE — Telephone Encounter (Signed)
Patient called in regards to her surgery to had yesterday with Dr. Marla Roe. The binding that is covering her bandages, continues to slide above her belly button and she would like to know if this is something she should be concerned about.  Please follow up with patient as she is concerned that she may mess up the bandages.

## 2021-03-16 ENCOUNTER — Telehealth: Payer: Self-pay

## 2021-03-16 NOTE — Telephone Encounter (Signed)
Patient said that she had surgery last Thursday and is experiencing really sharp and burning pain near the incision on her left side that brings her to tears.  Patient said the pain comes and goes.  She said it seems to happen about 5-10 minutes after she empties her drain.  She would like to know how long this will last.  Please call.

## 2021-03-16 NOTE — Telephone Encounter (Signed)
Did not receive notification from message until Monday a.m., called patient to discuss.  She reports that she is feeling much better.  She has not been taking any hydrocodone, reports no additional episodes of feeling dizzy or passing out.  She reports she is drinking plenty of fluids.  She is otherwise feeling well.  She did have some questions about some sharp shooting pains after emptying her JP drains.  She reports no other changes and is overall doing well.

## 2021-03-16 NOTE — Discharge Summary (Signed)
Physician Discharge Summary  Patient ID: Leah Gilmore MRN: [id number] DOB/AGE: [birth year] 36 y.o.  Admit date: 03/12/2021 Discharge date: 03/16/2021  Admission Diagnoses:  Discharge Diagnoses:  Principal Problem:   Panniculitis   Discharged Condition: good  Hospital Course: The patient was admitted and taken to the operating room for a panniculectomy.  She did very well throughout the case.  When she was fully awake in the recovery room she decided that she felt very comfortable going home.  She was therefore discharged to home in stable condition.  Consults: None  Significant Diagnostic Studies: none  Treatments: surgery  Discharge Exam: Blood pressure 119/83, pulse 94, temperature 97.8 F (36.6 C), resp. rate 18, height 5\' 7"  (1.702 m), weight 64.4 kg, SpO2 97 %. General appearance: alert, cooperative, and no distress Incision/Wound: intact and dressing in place  Disposition:    Allergies as of 03/12/2021       Reactions   Azithromycin    hives   Food    Kiwi & Grapefruit-tongue swells   Grapefruit Extract Swelling   Kiwi Extract Swelling        Medication List     TAKE these medications    Calcium 500 MG tablet Take 500 mg by mouth 3 (three) times daily.   MULTIVITAMIN/IRON PO Take 1 tablet by mouth daily.   ondansetron 4 MG disintegrating tablet Commonly known as: ZOFRAN-ODT Take 1 tablet (4 mg total) by mouth every 8 (eight) hours as needed for nausea or vomiting.   Semaglutide (1 MG/DOSE) 4 MG/3ML Sopn Inject 1 mg as directed once a week.   sertraline 50 MG tablet Commonly known as: ZOLOFT Take 1.5 tablets (75 mg total) by mouth daily.   Vitamin D (Ergocalciferol) 1.25 MG (50000 UNIT) Caps capsule Commonly known as: DRISDOL TAKE 1 CAPSULE (50,000 UNITS TOTAL) BY MOUTH ONCE A WEEK.   vitamin D3 50 MCG (2000 UT) Caps Take 2,000 Units by mouth.        Follow-up Information     Tyja Gortney, 05/10/2021, DO Follow up in 10 day(s).    Specialty: Plastic Surgery Contact information: [address] [place] [phone]                 Signed: [phone] Kevron Patella 03/16/2021, 10:26 AM

## 2021-03-16 NOTE — Telephone Encounter (Signed)
Leah Gilmore  spoke with patient this afternoon and answered questions.

## 2021-03-20 ENCOUNTER — Other Ambulatory Visit: Payer: Self-pay

## 2021-03-20 ENCOUNTER — Ambulatory Visit (INDEPENDENT_AMBULATORY_CARE_PROVIDER_SITE_OTHER): Payer: No Typology Code available for payment source | Admitting: Plastic Surgery

## 2021-03-20 DIAGNOSIS — Z719 Counseling, unspecified: Secondary | ICD-10-CM

## 2021-03-20 NOTE — Progress Notes (Signed)
The patient is a 36 year old female here for follow-up after undergoing a panniculectomy.  She is doing extremely well and is very happy with her results.  I was able to remove the right drain.  We will plan on hopefully removing the left drain next week since the output has been very minimal.  She looks really good and has no sign of a hematoma or seroma.  I have encouraged her to get a pair of spanks.  I think that will be much more comfortable than the binder.

## 2021-03-27 ENCOUNTER — Telehealth: Payer: Self-pay | Admitting: Plastic Surgery

## 2021-03-27 NOTE — Telephone Encounter (Signed)
Patient called and stated that she would like to see if her other drain can be removed because it is beginning to cause her pain.   Please follow up with patient and let front desk staff know if additional scheduling is needed.

## 2021-03-31 ENCOUNTER — Ambulatory Visit (INDEPENDENT_AMBULATORY_CARE_PROVIDER_SITE_OTHER): Payer: No Typology Code available for payment source | Admitting: Surgical

## 2021-03-31 ENCOUNTER — Other Ambulatory Visit: Payer: Self-pay

## 2021-03-31 DIAGNOSIS — M546 Pain in thoracic spine: Secondary | ICD-10-CM

## 2021-03-31 DIAGNOSIS — Z719 Counseling, unspecified: Secondary | ICD-10-CM

## 2021-03-31 DIAGNOSIS — G8929 Other chronic pain: Secondary | ICD-10-CM

## 2021-03-31 DIAGNOSIS — M793 Panniculitis, unspecified: Secondary | ICD-10-CM

## 2021-03-31 NOTE — Progress Notes (Signed)
Patient is a 36 year old female here for follow-up after undergoing panniculectomy with Dr. Ulice Bold.  She has her left drain in place, reports output had been very little for the past week or so, reports she has been a little bit more active so the output has increased over the past few days.  Output is approximately 25 to 35 cc. She reports she is overall feeling well.  She is not having any infectious symptoms.  She reports that she is very excited about her result, she is very happy.  Chaperone present on exam On exam umbilicus is viable.  Abdominal incisions are intact and healing well.  I do not appreciate any subcutaneous fluid collections with palpation.  There is approximately 3 cc of serosanguineous drainage noted in the left JP drain.  There is no surrounding erythema.  No cellulitic changes noted.   Recommend following up in 2 weeks for reevaluation.  Avoid strenuous activities.  No heavy lifting greater than 20 pounds.  Recommend calling with questions or concerns.  No signs of infection.

## 2021-04-03 ENCOUNTER — Encounter: Payer: No Typology Code available for payment source | Admitting: Surgical

## 2021-04-20 ENCOUNTER — Other Ambulatory Visit: Payer: Self-pay

## 2021-04-20 ENCOUNTER — Encounter: Payer: Self-pay | Admitting: Family Medicine

## 2021-04-20 ENCOUNTER — Ambulatory Visit (INDEPENDENT_AMBULATORY_CARE_PROVIDER_SITE_OTHER): Payer: No Typology Code available for payment source | Admitting: Family Medicine

## 2021-04-20 VITALS — BP 130/70 | Temp 98.6°F | Ht 67.0 in | Wt <= 1120 oz

## 2021-04-20 DIAGNOSIS — E559 Vitamin D deficiency, unspecified: Secondary | ICD-10-CM

## 2021-04-20 DIAGNOSIS — F4322 Adjustment disorder with anxiety: Secondary | ICD-10-CM

## 2021-04-20 DIAGNOSIS — D508 Other iron deficiency anemias: Secondary | ICD-10-CM | POA: Diagnosis not present

## 2021-04-20 DIAGNOSIS — Z0000 Encounter for general adult medical examination without abnormal findings: Secondary | ICD-10-CM | POA: Diagnosis not present

## 2021-04-20 DIAGNOSIS — Z8639 Personal history of other endocrine, nutritional and metabolic disease: Secondary | ICD-10-CM | POA: Insufficient documentation

## 2021-04-20 DIAGNOSIS — Z9884 Bariatric surgery status: Secondary | ICD-10-CM

## 2021-04-20 DIAGNOSIS — E282 Polycystic ovarian syndrome: Secondary | ICD-10-CM

## 2021-04-20 HISTORY — DX: Personal history of other endocrine, nutritional and metabolic disease: Z86.39

## 2021-04-20 MED ORDER — SERTRALINE HCL 50 MG PO TABS: 25.0000 mg | ORAL_TABLET | Freq: Every day | ORAL | 0 refills | Status: DC

## 2021-04-20 NOTE — Patient Instructions (Signed)
Please return in 12 months for your annual complete physical; please come fasting.   I will release your lab results to you on your MyChart account with further instructions. You may see the results before I do, but when I review them I will send you a message with my report or have my assistant call you if things need to be discussed. Please reply to my message with any questions. Thank you!   I recommend therapy to help "unpack" your concerns as we discussed. It can help!  If you have any questions or concerns, please don't hesitate to send me a message via MyChart or call the office at [phone]. Thank you for visiting with [name] today! It's our pleasure caring for you.

## 2021-04-20 NOTE — Addendum Note (Signed)
Addended by: Loura Back on: 04/20/2021 10:06 AM   Modules accepted: Orders

## 2021-04-20 NOTE — Progress Notes (Signed)
Subjective  Chief Complaint  Patient presents with   Annual Exam    Non fasting    HPI: Leah Gilmore is a 36 y.o. female who presents to [facility] today for a Female Wellness Visit.  She also has the concerns and/or needs as listed above in the chief complaint. These will be addressed in addition to the Health Maintenance Visit.   Wellness Visit: annual visit with health maintenance review and exam without Pap  HM: 36 year old status post panniculectomy last month.  Doing very well.  Reviewed postop notes, recovery is going well.  Also had uterine ablation back in August 2022 for menorrhagia.  Fortunately now having rare spotting.  Overall, doing well, however she is complains of anxiety and irritability.  She and her wife have 4 children and they are fostering a 70-month-old.  She finds herself anxious and inability to cope at times.  She discussed this with the healthy weight loss physician who ordered sertraline.  She took the low-dose for a while but has not continued it.  She continues to worry about weight loss Chronic disease management visit and/or acute problem visit: History of obesity: Status post gastric bypass surgery and currently on 1 mg of Ozempic weekly down from 2 mg weekly.  She remains very concerned that she could regain her weight.  Somewhat obsessive thinking.  Also body dysmorphic.  I reviewed their notes PCOS: Status post ablation.  No longer having menorrhagia. Iron deficiency anemia due to poor absorption: Last iron infusion was about a year ago.  No longer with heavy menstrual cycles.  Due for recheck History of vitamin deficiencies: Taking B12 and vitamin D.  Assessment  1. Annual physical exam   2. S/P gastric bypass   3. PCOS (polycystic ovarian syndrome)   4. Other iron deficiency anemia   5. Vitamin D deficiency   6. History of obesity   7. Adjustment disorder with anxiety      Plan  Female Wellness Visit: Age  appropriate Health Maintenance and Prevention measures were discussed with patient. Included topics are cancer screening recommendations, ways to keep healthy (see AVS) including dietary and exercise recommendations, regular eye and dental care, use of seat belts, and avoidance of moderate alcohol use and tobacco use.  Screens are current BMI: discussed patient's BMI and encouraged positive lifestyle modifications to help get to or maintain a target BMI. HM needs and immunizations were addressed and ordered. See below for orders. See HM and immunization section for updates. Routine labs and screening tests ordered including cmp, cbc and lipids where appropriate. Discussed recommendations regarding Vit D and calcium supplementation (see AVS)  Chronic disease f/u and/or acute problem visit: (deemed necessary to be done in addition to the wellness visit): History of obesity status post gastric bypass: Had long discussion regarding goal weight and coming to terms with her success.  Currently Ozempic dose has been decreased.  She will monitor her weight.  I recommend psychotherapy to discuss her fear of weight gain, obsession with weight loss, and body dysmorphism.  Patient agrees. Adjustment disorder with anxiety: In part to the above and also due to stress of new foster child.  Counseling can help.  She can consider restarting Zoloft at 25 mg daily.  Discussed possible weight gain with higher dosages. Check vitamin levels Monitoring A1c and lipids with history of PCOS.  Follow up: 12 months for complete physical  Orders Placed This Encounter  Procedures   CBC with Differential/Platelet  Comprehensive metabolic panel   Hemoglobin A1c   Lipid panel   Iron, TIBC and Ferritin Panel   Hepatitis C antibody   VITAMIN D 25 Hydroxy (Vit-D Deficiency, Fractures)   Vitamin B12   Meds ordered this encounter  Medications   sertraline (ZOLOFT) 50 MG tablet    Sig: Take 0.5 tablets (25 mg total) by mouth  daily.    Dispense:  135 tablet    Refill:  0       Body mass index is 2.22 kg/m. Wt Readings from Last 3 Encounters:  04/20/21 14 lb 3.2 oz (6.441 kg)  03/12/21 141 lb 15.6 oz (64.4 kg)  02/17/21 143 lb 12.8 oz (65.2 kg)     Patient Active Problem List   Diagnosis Date Noted   Vitamin D deficiency 04/20/2021   History of obesity 04/20/2021   Adjustment disorder with anxiety 04/20/2021   Iron deficiency anemia 03/11/2017   PCOS (polycystic ovarian syndrome) 03/29/2016   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   Health Maintenance  Topic Date Due   Hepatitis C Screening  Never done   COVID-19 Vaccine (4 - Booster for Pfizer series) 02/08/2020   INFLUENZA VACCINE  10/06/2020   TETANUS/TDAP  10/11/2023   PAP SMEAR-Modifier  04/17/2025   HIV Screening  Completed   HPV VACCINES  Aged Out   Immunization History  Administered Date(s) Administered   Influenza, Seasonal, Injecte, Preservative Fre 11/20/2014   Influenza,inj,Quad PF,6+ Mos 10/31/2016, 12/16/2017   Influenza-Unspecified 11/29/2016, 12/14/2019   PFIZER(Purple Top)SARS-COV-2 Vaccination 05/11/2019, 06/01/2019, 12/14/2019   Tdap 10/10/2013   We updated and reviewed the patient's past history in detail and it is documented below. Allergies: Patient  reports no history of alcohol use. Past Medical History Patient  has a past medical history of Anemia, COVID-19 (11/03/2020), History of obesity (04/20/2021), Iron deficiency anemia (03/11/2017), Multiple food allergies, PCOS (polycystic ovarian syndrome) (03/29/2016), S/P gastric bypass (03/29/2016), and Vitamin D deficiency. 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 Social History   Socioeconomic History   Marital status: Married    Spouse name: Glenford Peers   Number of children: 2   Years of education:  Not on file   Highest education level: Not on file  Occupational History   Occupation: Counselling psychologist, housing loans    Employer: [employer]  Tobacco Use   Smoking status: Never   Smokeless tobacco: Never  Vaping Use   Vaping Use: Never used  Substance and Sexual Activity   Alcohol use: No    Alcohol/week: 0.0 standard drinks    Comment: rarely   Drug use: No   Sexual activity: Yes    Birth control/protection: None    Comment: same sex couple  Other Topics Concern   Not on file  Social History Narrative   Not on file   Social Determinants of Health   Financial Resource Strain: Not on file  Food Insecurity: Not on file  Transportation Needs: Not on file  Physical Activity: Not on file  Stress: Not on file  Social Connections: Not on file   Family History  Problem Relation Age of Onset   Thyroid disease Mother    Depression Mother    Anxiety disorder Mother    Obesity Mother    Cancer Father 92       lung cancer, smoker   Alcohol abuse Father    Drug  abuse Father    Liver disease Father    Diabetes Maternal Grandmother    Cancer Maternal Grandfather        prostate   Diabetes Paternal Grandmother    Hypertension Paternal Grandmother     Review of Systems: Constitutional: negative for fever or malaise Ophthalmic: negative for photophobia, double vision or loss of vision Cardiovascular: negative for chest pain, dyspnea on exertion, or new LE swelling Respiratory: negative for SOB or persistent cough Gastrointestinal: negative for abdominal pain, change in bowel habits or melena Genitourinary: negative for dysuria or gross hematuria, no abnormal uterine bleeding or disharge Musculoskeletal: negative for new gait disturbance or muscular weakness Integumentary: negative for new or persistent rashes, no breast lumps Neurological: negative for TIA or stroke symptoms Psychiatric: negative for SI or delusions Allergic/Immunologic: negative for  hives  Patient Care Team    Relationship Specialty Notifications Start End  Leamon Arnt, MD PCP - General Family Medicine  03/23/16   Nicholas Lose, MD Consulting Physician Hematology and Oncology  03/20/18   Dillingham, [name], DO Attending Physician Plastic Surgery  04/17/19   Janyth Contes, MD Consulting Physician Obstetrics and Gynecology  04/20/21     Objective  Vitals: BP 130/70    Temp 98.6 F (37 C) (Temporal)    Ht 5\' 7"  (1.702 m)    Wt 14 lb 3.2 oz (6.441 kg)    BMI 2.22 kg/m  General:  Well developed, well nourished, no acute distress, appears stated Psych:  Alert and orientedx3,normal mood and affect HEENT:  Normocephalic, atraumatic, non-icteric sclera, PERRL, supple neck without adenopathy, mass or thyromegaly Cardiovascular:  Normal S1, S2, RRR without gallop, rub or murmur Respiratory:  Good breath sounds bilaterally, CTAB with normal respiratory effort Gastrointestinal: normal bowel sounds, soft, non-tender, no noted masses. No HSM MSK: no deformities, contusions. Joints are without erythema or swelling.  Skin:  Warm, no rashes or suspicious lesions noted Neurologic:    Mental status is normal. Gross motor and sensory exams are normal. Normal gait. No tremor Breast Exam: No mass, skin retraction or nipple discharge is appreciated in either breast. No axillary adenopathy. Fibrocystic changes are not noted    Commons side effects, risks, benefits, and alternatives for medications and treatment plan prescribed today were discussed, and the patient expressed understanding of the given instructions. Patient is instructed to call or message via MyChart if he/she has any questions or concerns regarding our treatment plan. No barriers to understanding were identified. We discussed Red Flag symptoms and signs in detail. Patient expressed understanding regarding what to do in case of urgent or emergency type symptoms.  Medication list was reconciled, printed and provided  to the patient in AVS. Patient instructions and summary information was reviewed with the patient as documented in the AVS. This note was prepared with assistance of Dragon voice recognition software. Occasional wrong-word or sound-a-like substitutions may have occurred due to the inherent limitations of voice recognition software  This visit occurred during the SARS-CoV-2 public health emergency.  Safety protocols were in place, including screening questions prior to the visit, additional usage of staff PPE, and extensive cleaning of exam room while observing appropriate contact time as indicated for disinfecting solutions.

## 2021-04-21 ENCOUNTER — Ambulatory Visit (INDEPENDENT_AMBULATORY_CARE_PROVIDER_SITE_OTHER): Payer: No Typology Code available for payment source | Admitting: Physician Assistant

## 2021-04-21 ENCOUNTER — Other Ambulatory Visit: Payer: Self-pay

## 2021-04-21 DIAGNOSIS — Z9889 Other specified postprocedural states: Secondary | ICD-10-CM

## 2021-04-21 LAB — IRON,TIBC AND FERRITIN PANEL
%SAT: 13 % (calc) — ABNORMAL LOW (ref 16–45)
Ferritin: 20 ng/mL (ref 16–154)
Iron: 51 ug/dL (ref 40–190)
TIBC: 380 mcg/dL (calc) (ref 250–450)

## 2021-04-21 LAB — COMPREHENSIVE METABOLIC PANEL
AG Ratio: 1.4 (calc) (ref 1.0–2.5)
ALT: 18 U/L (ref 6–29)
AST: 21 U/L (ref 10–30)
Albumin: 4 g/dL (ref 3.6–5.1)
Alkaline phosphatase (APISO): 63 U/L (ref 31–125)
BUN: 14 mg/dL (ref 7–25)
CO2: 28 mmol/L (ref 20–32)
Calcium: 8.9 mg/dL (ref 8.6–10.2)
Chloride: 107 mmol/L (ref 98–110)
Creat: 0.69 mg/dL (ref 0.50–0.97)
Globulin: 2.9 g/dL (calc) (ref 1.9–3.7)
Glucose, Bld: 82 mg/dL (ref 65–99)
Potassium: 4.2 mmol/L (ref 3.5–5.3)
Sodium: 140 mmol/L (ref 135–146)
Total Bilirubin: 0.8 mg/dL (ref 0.2–1.2)
Total Protein: 6.9 g/dL (ref 6.1–8.1)

## 2021-04-21 LAB — CBC WITH DIFFERENTIAL/PLATELET
Absolute Monocytes: 238 cells/uL (ref 200–950)
Basophils Absolute: 10 cells/uL (ref 0–200)
Basophils Relative: 0.4 %
Eosinophils Absolute: 30 cells/uL (ref 15–500)
Eosinophils Relative: 1.2 %
HCT: 30.9 % — ABNORMAL LOW (ref 35.0–45.0)
Hemoglobin: 9.7 g/dL — ABNORMAL LOW (ref 11.7–15.5)
Lymphs Abs: 1150 cells/uL (ref 850–3900)
MCH: 27.8 pg (ref 27.0–33.0)
MCHC: 31.4 g/dL — ABNORMAL LOW (ref 32.0–36.0)
MCV: 88.5 fL (ref 80.0–100.0)
MPV: 11.9 fL (ref 7.5–12.5)
Monocytes Relative: 9.5 %
Neutro Abs: 1073 cells/uL — ABNORMAL LOW (ref 1500–7800)
Neutrophils Relative %: 42.9 %
Platelets: 257 10*3/uL (ref 140–400)
RBC: 3.49 10*6/uL — ABNORMAL LOW (ref 3.80–5.10)
RDW: 12.3 % (ref 11.0–15.0)
Total Lymphocyte: 46 %
WBC: 2.5 10*3/uL — ABNORMAL LOW (ref 3.8–10.8)

## 2021-04-21 LAB — LIPID PANEL
Cholesterol: 164 mg/dL (ref <200)
HDL: 79 mg/dL (ref >=50)
LDL Cholesterol (Calc): 71 mg/dL (calc)
Non-HDL Cholesterol (Calc): 85 mg/dL (calc) (ref <130)
Total CHOL/HDL Ratio: 2.1 (calc) (ref <5.0)
Triglycerides: 48 mg/dL (ref <150)

## 2021-04-21 LAB — HEMOGLOBIN A1C
Hgb A1c MFr Bld: 4.4 % of total Hgb (ref <5.7)
Mean Plasma Glucose: 80 mg/dL
eAG (mmol/L): 4.4 mmol/L

## 2021-04-21 LAB — HEPATITIS C ANTIBODY
Hepatitis C Ab: NONREACTIVE
SIGNAL TO CUT-OFF: <0.02 (ref <1.00)

## 2021-04-21 LAB — VITAMIN B12: Vitamin B-12: 674 pg/mL (ref 200–1100)

## 2021-04-21 LAB — VITAMIN D 25 HYDROXY (VIT D DEFICIENCY, FRACTURES): Vit D, 25-Hydroxy: 26 ng/mL — ABNORMAL LOW (ref 30–100)

## 2021-04-21 NOTE — Progress Notes (Signed)
Patient is a pleasant 36 year old female with PMH of Roux-en-Y gastric bypass and recurrent panniculitis s/p panniculectomy performed 03/12/2021 with Dr. Ulice Bold who presents to clinic for postoperative follow-up.  She was last seen here in clinic 03/31/2021.  At that time, physical exam was reassuring.  Recommended continued activity modifications.  Today, patient is doing well.  She states that her abdominal binder is uncomfortable at night, but has otherwise been wearing it at all times.  She also states that she has been very cautious with regard to her postoperative recovery, avoiding all caffeine and alcohol.  She has also been adhering to activity modifications, but very much looks forward to exercising soon.  She is overall very pleased with the surgical outcome and is grateful for having had the surgery.  She expresses that she may return when she has saved some money for additional surgery to address the excess skin on her arms and legs.  Physical exam is entirely reassuring.  Umbilicus is viable.  Abdominal incision is completely healed.  No areas concerning for subcutaneous fluid collection.  Good symmetry.  Discussed scar creams.  She is looking for an affordable over-the-counter option, discussed Mederma specifically.  Given that this is a new scar, recommend that they apply Mederma once daily at night x8 weeks.  Patient is cleared of all restrictions with exception of avoiding submersion of her incisions in water until 06/2021.  No specific follow-up required, but she can certainly call the clinic to return if she has any future questions or concerns.  Picture(s) obtained of the patient and placed in the chart were with the patient's or guardian's permission.

## 2021-04-22 ENCOUNTER — Encounter (INDEPENDENT_AMBULATORY_CARE_PROVIDER_SITE_OTHER): Payer: Self-pay | Admitting: Family Medicine

## 2021-04-23 ENCOUNTER — Other Ambulatory Visit: Payer: Self-pay

## 2021-04-23 ENCOUNTER — Encounter (INDEPENDENT_AMBULATORY_CARE_PROVIDER_SITE_OTHER): Payer: Self-pay | Admitting: Family Medicine

## 2021-04-23 ENCOUNTER — Telehealth (INDEPENDENT_AMBULATORY_CARE_PROVIDER_SITE_OTHER): Payer: No Typology Code available for payment source | Admitting: Family Medicine

## 2021-04-23 DIAGNOSIS — E559 Vitamin D deficiency, unspecified: Secondary | ICD-10-CM

## 2021-04-23 DIAGNOSIS — E8881 Metabolic syndrome: Secondary | ICD-10-CM | POA: Diagnosis not present

## 2021-04-23 DIAGNOSIS — D508 Other iron deficiency anemias: Secondary | ICD-10-CM

## 2021-04-23 DIAGNOSIS — E669 Obesity, unspecified: Secondary | ICD-10-CM

## 2021-04-23 DIAGNOSIS — Z6821 Body mass index (BMI) 21.0-21.9, adult: Secondary | ICD-10-CM

## 2021-04-23 MED ORDER — SEMAGLUTIDE (1 MG/DOSE) 4 MG/3ML ~~LOC~~ SOPN: 1.0000 mg | PEN_INJECTOR | SUBCUTANEOUS | 0 refills | Status: DC

## 2021-04-23 MED ORDER — VITAMIN D (ERGOCALCIFEROL) 1.25 MG (50000 UNIT) PO CAPS: ORAL_CAPSULE | ORAL | 0 refills | Status: DC

## 2021-04-23 NOTE — Progress Notes (Signed)
TeleHealth Visit:  Due to the COVID-19 pandemic, this visit was completed with telemedicine (audio/video) technology to reduce patient and provider exposure as well as to preserve personal protective equipment.   Leah Gilmore has verbally consented to this TeleHealth visit. The patient is located at home, the provider is located at the [facility] office. The participants in this visit include the listed provider and patient. The visit was conducted today via video.   Chief Complaint: OBESITY Leah Gilmore is here to discuss her progress with her obesity treatment plan along with follow-up of her obesity related diagnoses. Leah Gilmore is on following a lower carbohydrate, vegetable and lean protein rich diet plan and states she is following her eating plan approximately 60% of the time. Leah Gilmore states she is not currently exercising.  Today's visit was #: 29 Starting weight: 209 lbs Starting date: 04/30/2019  Interim History: Pt has undergone panniculectomy since her last appt. She is 6 weeks post-op. She has gained 10 lbs since her last appt and is worried about why her weight has not gone back down. She is doing decently on low carb and wants to get back on plan and be more in line with eating on plan.  Subjective:   1. Vitamin D deficiency Pt is on Rx Vit D and reports fatigue.  2. Other iron deficiency anemia Pt hasn't been about to tolerate bariatric multivitamin. Her iron, ferritin, and MCV all within normal limits. Pt reports fatigue.  3. Insulin resistance Pt is on Ozempic but feeling an increase in hunger. She denies GI side effects.  Assessment/Plan:   1. Vitamin D deficiency Low Vitamin D level contributes to fatigue and are associated with obesity, breast, and colon cancer. She agrees to continue to take prescription Vitamin D 50,000 IU every week and will follow-up for routine testing of Vitamin D, at least 2-3 times per year to avoid over-replacement.  Refill- Vitamin D,  Ergocalciferol, (DRISDOL) 1.25 MG (50000 UNIT) CAPS capsule; TAKE 1 CAPSULE (50,000 UNITS TOTAL) BY MOUTH ONCE A WEEK.  Dispense: 4 capsule; Refill: 0  2. Other iron deficiency anemia Orders and follow up as documented in patient record. CBC at next appt- may need to re-evaluate with hematology.  Counseling Iron is essential for our bodies to make red blood cells.  Reasons that someone may be deficient include: an iron-deficient diet (more likely in those following vegan or vegetarian diets), women with heavy menses, patients with GI disorders or poor absorption, patients that have had bariatric surgery, frequent blood donors, patients with cancer, and patients with heart disease.   Iron-rich foods include dark leafy greens, red and white meats, eggs, seafood, and beans.   Certain foods and drinks prevent your body from absorbing iron properly. Avoid eating these foods in the same meal as iron-rich foods or with iron supplements. These foods include: coffee, black tea, and red wine; milk, dairy products, and foods that are high in calcium; beans and soybeans; whole grains.  Constipation can be a side effect of iron supplementation. Increased water and fiber intake are helpful. Water goal: > 2 liters/day. Fiber goal: > 25 grams/day.  3. Insulin resistance Leah Gilmore will continue to work on weight loss, exercise, and decreasing simple carbohydrates to help decrease the risk of diabetes. Leah Gilmore agreed to follow-up with [name] as directed to closely monitor her progress.  Refill- Semaglutide, 1 MG/DOSE, 4 MG/3ML SOPN; Inject 1 mg as directed once a week.  Dispense: 3 mL; Refill: 0  4. Obesity with  current BMI of 21.7 Leah Gilmore is currently in the action stage of change. As such, her goal is to continue with weight loss efforts. She has agreed to following a lower carbohydrate, vegetable and lean protein rich diet plan.   Exercise goals:  As is  Behavioral modification strategies: increasing lean protein intake, meal  planning and cooking strategies, and keeping healthy foods in the home.  Leah Gilmore has agreed to follow-up with our clinic in 4 weeks. She was informed of the importance of frequent follow-up visits to maximize her success with intensive lifestyle modifications for her multiple health conditions.  Objective:   VITALS: Per patient if applicable, see vitals. GENERAL: Alert and in no acute distress. CARDIOPULMONARY: No increased WOB. Speaking in clear sentences.  PSYCH: Pleasant and cooperative. Speech normal rate and rhythm. Affect is appropriate. Insight and judgement are appropriate. Attention is focused, linear, and appropriate.  NEURO: Oriented as arrived to appointment on time with no prompting.   Lab Results  Component Value Date   CREATININE 0.69 04/20/2021   BUN 14 04/20/2021   NA 140 04/20/2021   K 4.2 04/20/2021   CL 107 04/20/2021   CO2 28 04/20/2021   Lab Results  Component Value Date   ALT 18 04/20/2021   AST 21 04/20/2021   ALKPHOS 53 03/19/2020   BILITOT 0.8 04/20/2021   Lab Results  Component Value Date   HGBA1C 4.4 04/20/2021   HGBA1C 4.8 05/01/2019   HGBA1C 4.8 04/30/2019   No results found for: INSULIN Lab Results  Component Value Date   TSH 2.14 04/17/2019   Lab Results  Component Value Date   CHOL 164 04/20/2021   HDL 79 04/20/2021   LDLCALC 71 04/20/2021   TRIG 48 04/20/2021   CHOLHDL 2.1 04/20/2021   Lab Results  Component Value Date   VD25OH 26 (L) 04/20/2021   VD25OH 37 03/19/2020   VD25OH 18 (L) 04/17/2019   Lab Results  Component Value Date   WBC 2.5 (L) 04/20/2021   HGB 9.7 (L) 04/20/2021   HCT 30.9 (L) 04/20/2021   MCV 88.5 04/20/2021   PLT 257 04/20/2021   Lab Results  Component Value Date   IRON 51 04/20/2021   TIBC 380 04/20/2021   FERRITIN 20 04/20/2021    Attestation Statements:   Reviewed by clinician on day of visit: allergies, medications, problem list, medical history, surgical history, family history, social history,  and previous encounter notes.  Coral Ceo, CMA, am acting as transcriptionist for Coralie Common, MD.   I have reviewed the above documentation for accuracy and completeness, and I agree with the above. Coralie Common, MD '

## 2021-05-20 ENCOUNTER — Ambulatory Visit (INDEPENDENT_AMBULATORY_CARE_PROVIDER_SITE_OTHER): Payer: No Typology Code available for payment source | Admitting: Family Medicine

## 2021-05-20 ENCOUNTER — Other Ambulatory Visit: Payer: Self-pay

## 2021-05-20 ENCOUNTER — Encounter (INDEPENDENT_AMBULATORY_CARE_PROVIDER_SITE_OTHER): Payer: Self-pay | Admitting: Family Medicine

## 2021-05-20 VITALS — BP 90/56 | HR 66 | Temp 98.6°F | Ht 67.0 in | Wt 137.0 lb

## 2021-05-20 DIAGNOSIS — Z6821 Body mass index (BMI) 21.0-21.9, adult: Secondary | ICD-10-CM | POA: Diagnosis not present

## 2021-05-20 DIAGNOSIS — E8881 Metabolic syndrome: Secondary | ICD-10-CM

## 2021-05-20 DIAGNOSIS — E669 Obesity, unspecified: Secondary | ICD-10-CM

## 2021-05-20 DIAGNOSIS — D508 Other iron deficiency anemias: Secondary | ICD-10-CM | POA: Diagnosis not present

## 2021-05-20 MED ORDER — SEMAGLUTIDE (1 MG/DOSE) 4 MG/3ML ~~LOC~~ SOPN: 1.0000 mg | PEN_INJECTOR | SUBCUTANEOUS | 0 refills | Status: DC

## 2021-05-20 NOTE — Progress Notes (Signed)
? ? ? ?Chief Complaint:  ? ?OBESITY ?Leah Gilmore is here to discuss her progress with her obesity treatment plan along with follow-up of her obesity related diagnoses. Leah Gilmore is on following a lower carbohydrate, vegetable and lean protein rich diet plan and states she is following her eating plan approximately 30% of the time. Leah Gilmore states she has not been exercising. ? ?Today's visit was #: 30 ?Starting weight: 209 lbs ?Starting date: 04/30/19 ?Today's weight: 137 lbs ?Today's date: 05/20/2021 ?Total lbs lost to date: 22 ?Total lbs lost since last in-office visit: 1 lb ? ?Interim History: Leah Gilmore has been very busy with life and being a mom. Her foster child is 65 months old. Leah Gilmore voices that she is feeling good as post op panniculectomy. She still experiences bloating and swelling. Leah Gilmore has stopped exercising due to time demands and stopping her trainer. She has been prenatals and blood booster. ? ?Subjective:  ? ?1. Insulin resistance ?Goal is HgbA1c < 5.7, fasting insulin closer to 5. Leah Gilmore is doing well on Ozempic 1 mg dose SubQ weekly. She denies any GI side effects. ? ?Lab Results  ?Component Value Date  ? HGBA1C 4.4 04/20/2021  ? ?No results found for: INSULIN ? ?2. Other iron deficiency anemia ?H/H was previously low, but Iron was 51 and Ferritin was 20. Leah Gilmore is on prenatal vitamins and has previously gotten iron infusions. ? ?Assessment/Plan:  ? ?1. Insulin resistance ?Leah Gilmore will continue to work on weight loss, exercise, and decreasing simple carbohydrates to help decrease the risk of diabetes. Leah Gilmore agreed to follow-up with [name] as directed to closely monitor her progress. ? ?- Semaglutide, 1 MG/DOSE, 4 MG/3ML SOPN; Inject 1 mg as directed once a week.  Dispense: 3 mL; Refill: 0 ? ?2. Other iron deficiency anemia ?Leah Gilmore is to schedule an iron infusion prior to her next appointment. ? ?3. Obesity with current BMI of 21.5 ? ?Leah Gilmore is currently in the action stage of change. As such, her goal is to continue with weight loss efforts. She  has agreed to following a lower carbohydrate, vegetable and lean protein rich diet plan.  ? ?Exercise goals: All adults should avoid inactivity. Some physical activity is better than none, and adults who participate in any amount of physical activity gain some health benefits. ? ?Behavioral modification strategies: increasing lean protein intake, meal planning and cooking strategies, and keeping healthy foods in the home. ? ?Leah Gilmore has agreed to follow-up with our clinic in 6 weeks. She was informed of the importance of frequent follow-up visits to maximize her success with intensive lifestyle modifications for her multiple health conditions.  ? ?Objective:  ? ?Blood pressure (!) 90/56, pulse 66, temperature 98.6 ?F (37 ?C), height 5\' 7"  (1.702 m), weight 137 lb (62.1 kg), SpO2 100 %. ?Body mass index is 21.46 kg/m?. ? ?General: Cooperative, alert, well developed, in no acute distress. ?HEENT: Conjunctivae and lids unremarkable. ?Cardiovascular: Regular rhythm.  ?Lungs: Normal work of breathing. ?Neurologic: No focal deficits.  ? ?Lab Results  ?Component Value Date  ? CREATININE 0.69 04/20/2021  ? BUN 14 04/20/2021  ? NA 140 04/20/2021  ? K 4.2 04/20/2021  ? CL 107 04/20/2021  ? CO2 28 04/20/2021  ? ?Lab Results  ?Component Value Date  ? ALT 18 04/20/2021  ? AST 21 04/20/2021  ? ALKPHOS 53 03/19/2020  ? BILITOT 0.8 04/20/2021  ? ?Lab Results  ?Component Value Date  ? HGBA1C 4.4 04/20/2021  ? HGBA1C 4.8 05/01/2019  ? HGBA1C 4.8 04/30/2019  ? ?  No results found for: INSULIN ?Lab Results  ?Component Value Date  ? TSH 2.14 04/17/2019  ? ?Lab Results  ?Component Value Date  ? CHOL 164 04/20/2021  ? HDL 79 04/20/2021  ? LDLCALC 71 04/20/2021  ? TRIG 48 04/20/2021  ? CHOLHDL 2.1 04/20/2021  ? ?Lab Results  ?Component Value Date  ? VD25OH 26 (L) 04/20/2021  ? VD25OH 37 03/19/2020  ? VD25OH 18 (L) 04/17/2019  ? ?Lab Results  ?Component Value Date  ? WBC 2.5 (L) 04/20/2021  ? HGB 9.7 (L) 04/20/2021  ? HCT 30.9 (L) 04/20/2021  ?  MCV 88.5 04/20/2021  ? PLT 257 04/20/2021  ? ?Lab Results  ?Component Value Date  ? IRON 51 04/20/2021  ? TIBC 380 04/20/2021  ? FERRITIN 20 04/20/2021  ? ? ?Attestation Statements:  ? ?Reviewed by clinician on day of visit: allergies, medications, problem list, medical history, surgical history, family history, social history, and previous encounter notes. ? ? ?I, Kirke Corin, CMA, am acting as transcriptionist for Reuben Likes, MD  ? ?I have reviewed the above documentation for accuracy and completeness, and I agree with the above. Reuben Likes, MD ? ?

## 2021-06-01 ENCOUNTER — Telehealth: Payer: Self-pay | Admitting: *Deleted

## 2021-06-01 NOTE — Telephone Encounter (Signed)
Received call from pt requesting hard script for iron study labs to be drawn.  Pt states she has no co-pay if labs are drawn at a [facility].  MD provided RN with hard script, script placed at the front desk and pt notified to pick up.  Pt verbalized understanding.  ?

## 2021-06-09 ENCOUNTER — Encounter: Payer: Self-pay | Admitting: Hematology and Oncology

## 2021-06-09 ENCOUNTER — Other Ambulatory Visit: Payer: Self-pay | Admitting: *Deleted

## 2021-06-09 NOTE — Progress Notes (Addendum)
?  ?HEMATOLOGY-ONCOLOGY [place] VISIT PROGRESS NOTE ? ?I connected with Leah Gilmore on 06/10/2021 at 11:15 AM EDT by Mychart video conference and verified that I am speaking with the correct person using two identifiers.  ?I discussed the limitations, risks, security and privacy concerns of performing an evaluation and management service by Webex and the availability of in person appointments.  ?I also discussed with the patient that there may be a patient responsible charge related to this service. The patient expressed understanding and agreed to proceed.  ?Patient's Location: Home ?Physician Location: Clinic ? ?CHIEF COMPLIANT: follow-up of anemia and neutropenia ? ?INTERVAL HISTORY: Leah Gilmore is a 36 y.o. female with above-mentioned history of 36 y.o. with above-mentioned history of iron deficiency anemia due to heavy menstrual cycles. Labs on 03/19/20 showed Hg 9.6, HCT 30.1, iron saturation 6%, ferritin 5, B-12 815, folate 11.7. She presents to the clinic today for my chart follow-up.  ?She has been feeling relatively well without any problems or concerns.  Her major symptom is severe fatigue. ? ?REVIEW OF SYSTEMS:   ?Constitutional: Denies fevers, chills or abnormal weight loss ?  ? ?Observations/Objective:  ?There were no vitals filed for this visit. ?There is no height or weight on file to calculate BMI.  ?I have reviewed the data as listed ? ?  Latest Ref Rng & Units 04/20/2021  ? 10:07 AM 03/19/2020  ?  3:50 PM 03/19/2020  ? 12:00 AM  ?CMP  ?Glucose 65 - 99 mg/dL 82   86     ?BUN 7 - 25 mg/dL 14   12     ?Creatinine 0.50 - 0.97 mg/dL 0.69   0.74     ?Sodium 135 - 146 mmol/L 140   139   139       ?Potassium 3.5 - 5.3 mmol/L 4.2   4.2     ?Chloride 98 - 110 mmol/L 107   106     ?CO2 20 - 32 mmol/L 28   25     ?Calcium 8.6 - 10.2 mg/dL 8.9   9.7     ?Total Protein 6.1 - 8.1 g/dL 6.9   7.4     ?Total Bilirubin 0.2 - 1.2 mg/dL 0.8   1.1     ?Alkaline Phos 25 - 125   53       ?AST 10 - 30 U/L _0 ?ALT 6 - 29 U/L 18   11     ?  ? This result is from an external source.  ? ? ?Lab Results  ?Component Value Date  ? WBC 2.5 (L) 04/20/2021  ? HGB 9.7 (L) 04/20/2021  ? HCT 30.9 (L) 04/20/2021  ? MCV 88.5 04/20/2021  ? PLT 257 04/20/2021  ? NEUTROABS 1,073 (L) 04/20/2021  ? ? ?  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  She has chronic iron deficiency ?IV iron: October 2019, February 2020 ?  ?Labs  ?11/14/2017: Ferritin 9 (IV iron October 2019) ?03/19/2020: Ferritin 5, iron saturation 6%, hemoglobin 9.6 ?04/20/21: WBC 2.5, Hb 9.7, ANC: 1.07, Vit D 26, B 12: 674, Iron sat: 13%, ferritin 20, Cr: 0.69  ? ?I discussed the lab results which show leukopenia along with her anemia. ?This could be ethnicity related or cyclical. ?We will check reticulocyte count, LDH, erythropoietin levels. ? ?However I recommend that we obtain  a bone marrow biopsy for further evaluation. ?  ? ?Leukopenia: Primarily neutropenia: I would like to check ANA, B12 ?Return to clinic after the bone marrow biopsy to discuss results. ? ? ? ?I discussed the assessment and treatment plan with the patient. The patient was provided an opportunity to ask questions and all were answered. The patient agreed with the plan and demonstrated an understanding of the instructions. The patient was advised to call back or seek an in-person evaluation if the symptoms worsen or if the condition fails to improve as anticipated.  ? ?I provided 30 minutes of face-to-face Web Ex time and time for charting and coordination of care during this encounter.   ? ?Rulon Eisenmenger, MD ?06/10/2021  ?I Gardiner Coins am scribing for Dr. Lindi Adie ? ?I have reviewed the above documentation for accuracy and completeness, and I agree with the above. ?  ?

## 2021-06-10 ENCOUNTER — Encounter: Payer: Self-pay | Admitting: Hematology and Oncology

## 2021-06-10 ENCOUNTER — Other Ambulatory Visit: Payer: Self-pay | Admitting: *Deleted

## 2021-06-10 ENCOUNTER — Other Ambulatory Visit: Payer: No Typology Code available for payment source

## 2021-06-10 ENCOUNTER — Telehealth: Payer: Self-pay | Admitting: Hematology and Oncology

## 2021-06-10 ENCOUNTER — Inpatient Hospital Stay
Payer: No Typology Code available for payment source | Attending: Hematology and Oncology | Admitting: Hematology and Oncology

## 2021-06-10 DIAGNOSIS — D649 Anemia, unspecified: Secondary | ICD-10-CM | POA: Insufficient documentation

## 2021-06-10 DIAGNOSIS — D509 Iron deficiency anemia, unspecified: Secondary | ICD-10-CM

## 2021-06-10 DIAGNOSIS — D72819 Decreased white blood cell count, unspecified: Secondary | ICD-10-CM | POA: Insufficient documentation

## 2021-06-10 DIAGNOSIS — D508 Other iron deficiency anemias: Secondary | ICD-10-CM | POA: Diagnosis not present

## 2021-06-10 NOTE — Telephone Encounter (Signed)
.  Called patient to schedule appointment per 4/5 inbasket, patient is aware of date and time.   ?

## 2021-06-10 NOTE — Assessment & Plan Note (Signed)
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 ?She has chronic iron deficiency ?IV iron: October 2019, February 2020 ?? ?Labs  ?11/14/2017: Ferritin 9 (IV iron October 2019) ?03/19/2020: Ferritin 5, iron saturation 6%, hemoglobin 9.6 ?04/20/21: WBC 2.5, Hb 9.7, ANC: 1.07, Vit D 26, B 12: 674, Iron sat: 13%, ferritin 20, Cr: 0.69  ? ?I discussed the lab results which show leukopenia along with her anemia. ?This could be ethnicity related or cyclical. ?However I recommend that we obtain a bone marrow biopsy for further evaluation. ?If the bone marrow is negative we can consider IV iron therapy. ?

## 2021-06-15 ENCOUNTER — Ambulatory Visit (INDEPENDENT_AMBULATORY_CARE_PROVIDER_SITE_OTHER): Payer: No Typology Code available for payment source | Admitting: Family Medicine

## 2021-06-17 ENCOUNTER — Ambulatory Visit (INDEPENDENT_AMBULATORY_CARE_PROVIDER_SITE_OTHER): Payer: No Typology Code available for payment source | Admitting: Family Medicine

## 2021-06-17 ENCOUNTER — Encounter (INDEPENDENT_AMBULATORY_CARE_PROVIDER_SITE_OTHER): Payer: Self-pay | Admitting: Family Medicine

## 2021-06-17 VITALS — BP 98/58 | HR 74 | Temp 97.8°F | Ht 67.0 in | Wt 137.0 lb

## 2021-06-17 DIAGNOSIS — E8881 Metabolic syndrome: Secondary | ICD-10-CM | POA: Diagnosis not present

## 2021-06-17 DIAGNOSIS — Z6821 Body mass index (BMI) 21.0-21.9, adult: Secondary | ICD-10-CM | POA: Diagnosis not present

## 2021-06-17 DIAGNOSIS — E559 Vitamin D deficiency, unspecified: Secondary | ICD-10-CM | POA: Diagnosis not present

## 2021-06-17 DIAGNOSIS — E669 Obesity, unspecified: Secondary | ICD-10-CM

## 2021-06-17 MED ORDER — SEMAGLUTIDE (1 MG/DOSE) 4 MG/3ML ~~LOC~~ SOPN: 1.0000 mg | PEN_INJECTOR | SUBCUTANEOUS | 0 refills | Status: DC

## 2021-06-17 MED ORDER — VITAMIN D (ERGOCALCIFEROL) 1.25 MG (50000 UNIT) PO CAPS: ORAL_CAPSULE | ORAL | 0 refills | Status: DC

## 2021-06-19 NOTE — Progress Notes (Signed)
?HEMATOLOGY-ONCOLOGY [place] VISIT PROGRESS NOTE ? ?I connected with Leah Gilmore on 07/03/2021 at  9:45 AM EDT by Mychart video conference and verified that I am speaking with the correct person using two identifiers.  ?I discussed the limitations, risks, security and privacy concerns of performing an evaluation and management service by Webex and the availability of in person appointments.  ?I also discussed with the patient that there may be a patient responsible charge related to this service. The patient expressed understanding and agreed to proceed.  ?Patient's Location: Home ?Physician Location: Clinic ? ?CHIEF COMPLIANT: history of anemia and neutropenia. ? ?INTERVAL HISTORY: Leah Gilmore is a 36 y.o. female with above-mentioned history of anemia and neutropenia.  She underwent a bone marrow biopsy and is here today to discuss the results.  She presents to the clinic today for my chart follow-up.  ? ?REVIEW OF SYSTEMS:   ?Constitutional: Denies fevers, chills or abnormal weight loss ?  ?All other systems were reviewed with the patient and are negative. ? ?Observations/Objective:  ?There were no vitals filed for this visit. ?There is no height or weight on file to calculate BMI.  ?I have reviewed the data as listed ? ?  Latest Ref Rng & Units 04/20/2021  ? 10:07 AM 03/19/2020  ?  3:50 PM 03/19/2020  ? 12:00 AM  ?CMP  ?Glucose 65 - 99 mg/dL 82   86     ?BUN 7 - 25 mg/dL 14   12     ?Creatinine 0.50 - 0.97 mg/dL 0.69   0.74     ?Sodium 135 - 146 mmol/L 140   139   139       ?Potassium 3.5 - 5.3 mmol/L 4.2   4.2     ?Chloride 98 - 110 mmol/L 107   106     ?CO2 20 - 32 mmol/L 28   25     ?Calcium 8.6 - 10.2 mg/dL 8.9   9.7     ?Total Protein 6.1 - 8.1 g/dL 6.9   7.4     ?Total Bilirubin 0.2 - 1.2 mg/dL 0.8   1.1     ?Alkaline Phos 25 - 125   53       ?AST 10 - 30 U/L 21   19   19        ?ALT 6 - 29 U/L 18   11     ?  ? This result is from an external source.  ? ? ?Lab Results  ?Component Value Date  ?  WBC 2.1 (L) 06/25/2021  ? HGB 11.1 (L) 06/25/2021  ? HCT 34.5 (L) 06/25/2021  ? MCV 86.7 06/25/2021  ? PLT 240 06/25/2021  ? NEUTROABS 0.8 (L) 06/25/2021  ? ? ?  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  She has chronic iron deficiency ?IV iron: October 2019, February 2020 ?  ?Labs  ?11/14/2017: Ferritin 9 (IV iron October 2019) ?03/19/2020: Ferritin 5, iron saturation 6%, hemoglobin 9.6 ?04/20/21: WBC 2.5, Hb 9.7, ANC: 1.07, Vit D 26, B 12: 674, Iron sat: 13%, ferritin 20, Cr: 0.69  ?06/25/2021: WBC 2.1, hemoglobin 11.1, ANC 0.8, platelets 240, B12 545, ANA negative, erythropoietin 8.3, LDH 117, absolute reticulocyte count 26.4%, reticulocyte percent: 0.7% ? ?Bone marrow aspiration biopsy: Normocellular marrow with trilineage hematopoiesis, no leukemia or lymphoma.  Cellularity 60%, cytogenetics pending, storage iron absent, ringed sideroblasts not identified ? ?Based on these findings,  it can attributed the decrease in the white blood cell count to either medications or infection or some peripheral process of decreased white blood cell count.  Hemoglobin has recovered.  There were no iron stores suggestive of underlying iron deficiency.  We can elect to watch and monitor for the time being. ? ?Recheck labs and follow-up in 3 months. ? ?I discussed the assessment and treatment plan with the patient. The patient was provided an opportunity to ask questions and all were answered. The patient agreed with the plan and demonstrated an understanding of the instructions. The patient was advised to call back or seek an in-person evaluation if the symptoms worsen or if the condition fails to improve as anticipated.  ? ?I provided 12 minutes  during this encounter and for charting and coordination of care.   ? ?Rulon Eisenmenger, MD ?07/03/2021  ?Marland KitchenI Gardiner Coins am scribing for Dr. Lindi Adie ? ?I have reviewed the above documentation  for accuracy and completeness, and I agree with the above. ?. ?

## 2021-06-22 NOTE — Progress Notes (Signed)
? ? ? ?Chief Complaint:  ? ?OBESITY ?Leah Gilmore is here to discuss her progress with her obesity treatment plan along with follow-up of her obesity related diagnoses. Leah Gilmore is on following a lower carbohydrate, vegetable and lean protein rich diet plan and states she is following her eating plan approximately 0% of the time. Leah Gilmore states she is doing 0 minutes 0 times per week. ? ?Today's visit was #: 31 ?Starting weight: 209 lbs ?Starting date: 04/30/2019 ?Today's weight: 137 lbs ?Today's date: 06/17/2021 ?Total lbs lost to date: 87 ?Total lbs lost since last in-office visit: 0 ? ?Interim History: Leah Gilmore is planning on a bone marrow biopsy next week by Dr. Lindi Gilmore. Life has been mostly the same as normal life. She is interested in adding in some muscle mass over the next few months. Going to [place] at the end of the month to see family. Noticing increase in sweet tooth recently.  ? ?Subjective:  ? ?1. Vitamin D deficiency ?Leah Gilmore is on Vitamin D prescription and note fatigue.  ? ?2. Insulin resistance ?Leah Gilmore is on Ozempic 1 mg weekly, and she denies GI side effects.  ? ?Assessment/Plan:  ? ?1. Vitamin D deficiency ?We will refill prescription Vitamin D 50,000 IU weekly for 1 month.  ? ?- Vitamin D, Ergocalciferol, (DRISDOL) 1.25 MG (50000 UNIT) CAPS capsule; TAKE 1 CAPSULE (50,000 UNITS TOTAL) BY MOUTH ONCE A WEEK.  Dispense: 4 capsule; Refill: 0 ? ?2. Insulin resistance ?We will refill Ozempic 1 mg SubQ weekly with no refills.  ? ?- Semaglutide, 1 MG/DOSE, 4 MG/3ML SOPN; Inject 1 mg as directed once a week.  Dispense: 3 mL; Refill: 0 ? ?3. Obesity with current BMI of 21.5 ?Leah Gilmore is currently in the action stage of change. As such, her goal is to continue with weight loss efforts. She has agreed to practicing portion control and making smarter food choices, such as increasing vegetables and decreasing simple carbohydrates.  ? ?Exercise goals: Patient is to start 10-15 minutes 3 times per week of activity.  ? ?Behavioral modification  strategies: increasing lean protein intake, meal planning and cooking strategies, keeping healthy foods in the home, and planning for success. ? ?Leah Gilmore has agreed to follow-up with our clinic in 4 to 5 weeks. She was informed of the importance of frequent follow-up visits to maximize her success with intensive lifestyle modifications for her multiple health conditions.  ? ?Objective:  ? ?Blood pressure (!) 98/58, pulse 74, temperature 97.8 ?F (36.6 ?C), height 5' 7"  (1.702 m), weight 137 lb (62.1 kg), SpO2 100 %. ?Body mass index is 21.46 kg/m?. ? ?General: Cooperative, alert, well developed, in no acute distress. ?HEENT: Conjunctivae and lids unremarkable. ?Cardiovascular: Regular rhythm.  ?Lungs: Normal work of breathing. ?Neurologic: No focal deficits.  ? ?Lab Results  ?Component Value Date  ? CREATININE 0.69 04/20/2021  ? BUN 14 04/20/2021  ? NA 140 04/20/2021  ? K 4.2 04/20/2021  ? CL 107 04/20/2021  ? CO2 28 04/20/2021  ? ?Lab Results  ?Component Value Date  ? ALT 18 04/20/2021  ? AST 21 04/20/2021  ? ALKPHOS 53 03/19/2020  ? BILITOT 0.8 04/20/2021  ? ?Lab Results  ?Component Value Date  ? HGBA1C 4.4 04/20/2021  ? HGBA1C 4.8 05/01/2019  ? HGBA1C 4.8 04/30/2019  ? ?No results found for: INSULIN ?Lab Results  ?Component Value Date  ? TSH 2.14 04/17/2019  ? ?Lab Results  ?Component Value Date  ? CHOL 164 04/20/2021  ? HDL 79 04/20/2021  ? [place] 71  04/20/2021  ? TRIG 48 04/20/2021  ? CHOLHDL 2.1 04/20/2021  ? ?Lab Results  ?Component Value Date  ? VD25OH 26 (L) 04/20/2021  ? VD25OH 37 03/19/2020  ? VD25OH 18 (L) 04/17/2019  ? ?Lab Results  ?Component Value Date  ? WBC 2.5 (L) 04/20/2021  ? HGB 9.7 (L) 04/20/2021  ? HCT 30.9 (L) 04/20/2021  ? MCV 88.5 04/20/2021  ? PLT 257 04/20/2021  ? ?Lab Results  ?Component Value Date  ? IRON 51 04/20/2021  ? TIBC 380 04/20/2021  ? FERRITIN 20 04/20/2021  ? ?Attestation Statements:  ? ?Reviewed by clinician on day of visit: allergies, medications, problem list, medical history,  surgical history, family history, social history, and previous encounter notes. ? ? ?I, Leah Gilmore, am acting as transcriptionist for Leah Common, MD. ? ?I have reviewed the above documentation for accuracy and completeness, and I agree with the above. Leah Common, MD ? ? ?

## 2021-06-25 ENCOUNTER — Inpatient Hospital Stay: Payer: No Typology Code available for payment source

## 2021-06-25 ENCOUNTER — Other Ambulatory Visit: Payer: Self-pay

## 2021-06-25 ENCOUNTER — Inpatient Hospital Stay (HOSPITAL_BASED_OUTPATIENT_CLINIC_OR_DEPARTMENT_OTHER): Payer: No Typology Code available for payment source | Admitting: Hematology and Oncology

## 2021-06-25 VITALS — BP 113/64 | HR 58 | Temp 98.2°F | Resp 18

## 2021-06-25 DIAGNOSIS — D509 Iron deficiency anemia, unspecified: Secondary | ICD-10-CM

## 2021-06-25 DIAGNOSIS — D508 Other iron deficiency anemias: Secondary | ICD-10-CM

## 2021-06-25 DIAGNOSIS — D649 Anemia, unspecified: Secondary | ICD-10-CM

## 2021-06-25 DIAGNOSIS — D72819 Decreased white blood cell count, unspecified: Secondary | ICD-10-CM | POA: Diagnosis not present

## 2021-06-25 LAB — VITAMIN B12: Vitamin B-12: 545 pg/mL (ref 180–914)

## 2021-06-25 LAB — CBC WITH DIFFERENTIAL (CANCER CENTER ONLY)
Abs Immature Granulocytes: 0 10*3/uL (ref 0.00–0.07)
Basophils Absolute: 0 10*3/uL (ref 0.0–0.1)
Basophils Relative: 1 %
Eosinophils Absolute: 0 10*3/uL (ref 0.0–0.5)
Eosinophils Relative: 1 %
HCT: 34.5 % — ABNORMAL LOW (ref 36.0–46.0)
Hemoglobin: 11.1 g/dL — ABNORMAL LOW (ref 12.0–15.0)
Immature Granulocytes: 0 %
Lymphocytes Relative: 49 %
Lymphs Abs: 1 10*3/uL (ref 0.7–4.0)
MCH: 27.9 pg (ref 26.0–34.0)
MCHC: 32.2 g/dL (ref 30.0–36.0)
MCV: 86.7 fL (ref 80.0–100.0)
Monocytes Absolute: 0.2 10*3/uL (ref 0.1–1.0)
Monocytes Relative: 10 %
Neutro Abs: 0.8 10*3/uL — ABNORMAL LOW (ref 1.7–7.7)
Neutrophils Relative %: 39 %
Platelet Count: 240 10*3/uL (ref 150–400)
RBC: 3.98 MIL/uL (ref 3.87–5.11)
RDW: 13.7 % (ref 11.5–15.5)
WBC Count: 2.1 10*3/uL — ABNORMAL LOW (ref 4.0–10.5)
nRBC: 0 % (ref 0.0–0.2)

## 2021-06-25 LAB — RETICULOCYTES
Immature Retic Fract: 6 % (ref 2.3–15.9)
RBC.: 3.94 MIL/uL (ref 3.87–5.11)
Retic Count, Absolute: 26.4 10*3/uL (ref 19.0–186.0)
Retic Ct Pct: 0.7 % (ref 0.4–3.1)

## 2021-06-25 LAB — LACTATE DEHYDROGENASE: LDH: 117 U/L (ref 98–192)

## 2021-06-25 NOTE — Progress Notes (Signed)
Pt stable upon discharge, procedure site clean dry and intact, no bleeding note. VSS, pt given discharge instructions, verbalized understanding to call if she needs anything.  ?

## 2021-06-25 NOTE — Progress Notes (Signed)
INDICATION: Anemia and leukopenia ?  ? ?Bone Marrow Biopsy and Aspiration Procedure Note  ? ?Informed consent was obtained and potential risks including bleeding, infection and pain were reviewed with the patient. ? ?The patient's name, date of birth, identification, consent and allergies were verified prior to the start of procedure and time out was performed. ? ?The left posterior iliac crest was chosen as the site of biopsy. ? ?The skin was prepped with ChloraPrep.  ? ?8 cc of 1% lidocaine was used to provide local anaesthesia.  ? ?10 cc of bone marrow aspirate was obtained followed by 1cm biopsy.  ?Pressure was applied to the biopsy site and bandage was placed over the biopsy site. ?Patient was made to lie on the back for 15 mins prior to discharge. ? ?The procedure was tolerated well. ?COMPLICATIONS: None ?BLOOD LOSS: none ?The patient was discharged home in stable condition with a 1 week follow up to review results.  Patient was provided with post bone marrow biopsy instructions and instructed to call if there was any bleeding or worsening pain. ? ?Specimens sent for flow cytometry, cytogenetics and additional studies. ? ?Signed ?Harriette Ohara, MD ? ?

## 2021-06-25 NOTE — Patient Instructions (Addendum)
Bone Marrow Aspiration and Bone Marrow Biopsy, Adult, Care After ?This sheet gives you information about how to care for yourself after your procedure. Your health care provider may also give you more specific instructions. If you have problems or questions, contact your health care provider. ?What can I expect after the procedure? ?After the procedure, it is common to have: ?Mild pain and tenderness. ?Swelling. ?Bruising. ?Follow these instructions at home: ?Puncture site care ? ?Follow instructions from your health care provider about how to take care of the puncture site. Make sure you: ?Wash your hands with soap and water before and after you change your bandage (dressing). If soap and water are not available, use hand sanitizer. ?Change your dressing as told by your health care provider. ?Check your puncture site every day for signs of infection. Check for: ?More redness, swelling, or pain. ?Fluid or blood. ?Warmth. ?Pus or a bad smell. ?Activity ?Return to your normal activities as told by your health care provider. Ask your health care provider what activities are safe for you. ?Do not lift anything that is heavier than 10 lb (4.5 kg), or the limit that you are told, until your health care provider says that it is safe. ?Do not drive for 24 hours if you were given a sedative during your procedure. ?General instructions ? ?Take over-the-counter and prescription medicines only as told by your health care provider. ?Do not take baths, swim, or use a hot tub until your health care provider approves. Ask your health care provider if you may take showers. You may only be allowed to take sponge baths. ?If directed, put ice on the affected area. To do this: ?Put ice in a plastic bag. ?Place a towel between your skin and the bag. ?Leave the ice on for 20 minutes, 2-3 times a day. ?Keep all follow-up visits as told by your health care provider. This is important. ?Contact a health care provider if: ?Your pain is not  controlled with medicine. ?You have a fever. ?You have more redness, swelling, or pain around the puncture site. ?You have fluid or blood coming from the puncture site. ?Your puncture site feels warm to the touch. ?You have pus or a bad smell coming from the puncture site. ?Summary ?After the procedure, it is common to have mild pain, tenderness, swelling, and bruising. ?Follow instructions from your health care provider about how to take care of the puncture site and what activities are safe for you. ?Take over-the-counter and prescription medicines only as told by your health care provider. ?Contact a health care provider if you have any signs of infection, such as fluid or blood coming from the puncture site. ?This information is not intended to replace advice given to you by your health care provider. Make sure you discuss any questions you have with your health care provider. ?Document Revised: 07/11/2018 Document Reviewed: 07/11/2018 ?Elsevier Patient Education ? [place]. ? ?Excuse from Work, [employer], or Physical Activity ?_______________[name]________ needs to be excused from: ?__X__ Work. ?____ [employer]. ?__X__ Physical activity. ?This is effective for the following dates: _______4/20/2023_______________________________. ? ?He or she may return to full physical activity on _4/21/2023___________. ? ?Health care provider name (printed): _________Brooke Labrisha Wuellner, RN ____________________________________________ ?Health care provider (signature): _________________________________________________________ ?Date: _____4/20/2023_________________________ ?This information is not intended to replace advice given to you by your health care provider. Make sure you discuss any questions you have with your health care provider. ?Document Revised: 11/12/2020 Document Reviewed: 11/12/2020 ?Elsevier Patient Education ? 2023 [employer] ? ?

## 2021-06-26 LAB — ANTINUCLEAR ANTIBODIES, IFA: ANA Ab, IFA: NEGATIVE

## 2021-06-26 LAB — ERYTHROPOIETIN: Erythropoietin: 8.3 m[IU]/mL (ref 2.6–18.5)

## 2021-07-03 ENCOUNTER — Inpatient Hospital Stay (HOSPITAL_BASED_OUTPATIENT_CLINIC_OR_DEPARTMENT_OTHER): Payer: No Typology Code available for payment source | Admitting: Hematology and Oncology

## 2021-07-03 ENCOUNTER — Encounter: Payer: Self-pay | Admitting: Hematology and Oncology

## 2021-07-03 DIAGNOSIS — D508 Other iron deficiency anemias: Secondary | ICD-10-CM

## 2021-07-03 NOTE — Assessment & Plan Note (Signed)
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 ?She has chronic iron deficiency ?IV iron: October 2019, February 2020 ?? ?Labs  ?11/14/2017: Ferritin 9?(IV iron October 2019) ?03/19/2020: Ferritin 5, iron saturation 6%, hemoglobin 9.6 ?04/20/21: WBC 2.5, Hb 9.7, ANC: 1.07, Vit D 26, B 12: 674, Iron sat: 13%, ferritin 20, Cr: 0.69  ?06/25/2021: WBC 2.1, hemoglobin 11.1, ANC 0.8, platelets 240, B12 545, ANA negative, erythropoietin 8.3, LDH 117, absolute reticulocyte count 26.4%, reticulocyte percent: 0.7% ? ?Bone marrow aspiration biopsy: Normocellular marrow with trilineage hematopoiesis, no leukemia or lymphoma.  Cellularity 60%, cytogenetics pending, storage iron absent, ringed sideroblasts not identified ? ?Based on these findings, it can attributed the decrease in the white blood cell count to either medications or infection or some peripheral process of decreased white blood cell count.  Hemoglobin has recovered.  There were no iron stores suggestive of underlying iron deficiency.  We can elect to watch and monitor for the time being. ? ?Recheck labs and follow-up in 3 months. ? ? ?

## 2021-07-06 ENCOUNTER — Encounter (HOSPITAL_COMMUNITY): Payer: Self-pay | Admitting: Hematology and Oncology

## 2021-07-06 LAB — SURGICAL PATHOLOGY

## 2021-07-15 ENCOUNTER — Telehealth (INDEPENDENT_AMBULATORY_CARE_PROVIDER_SITE_OTHER): Payer: No Typology Code available for payment source | Admitting: Family Medicine

## 2021-07-15 VITALS — Ht 67.0 in | Wt 139.0 lb

## 2021-07-15 DIAGNOSIS — E559 Vitamin D deficiency, unspecified: Secondary | ICD-10-CM | POA: Diagnosis not present

## 2021-07-15 DIAGNOSIS — E669 Obesity, unspecified: Secondary | ICD-10-CM

## 2021-07-15 DIAGNOSIS — Z6821 Body mass index (BMI) 21.0-21.9, adult: Secondary | ICD-10-CM

## 2021-07-15 DIAGNOSIS — E8881 Metabolic syndrome: Secondary | ICD-10-CM

## 2021-07-15 MED ORDER — VITAMIN D (ERGOCALCIFEROL) 1.25 MG (50000 UNIT) PO CAPS: ORAL_CAPSULE | ORAL | 0 refills | Status: DC

## 2021-07-15 MED ORDER — SEMAGLUTIDE (1 MG/DOSE) 4 MG/3ML ~~LOC~~ SOPN: 1.0000 mg | PEN_INJECTOR | SUBCUTANEOUS | 0 refills | Status: DC

## 2021-07-22 NOTE — Progress Notes (Signed)
TeleHealth Visit:  Due to the COVID-19 pandemic, this visit was completed with telemedicine (audio/video) technology to reduce patient and provider exposure as well as to preserve personal protective equipment.   Leah Gilmore has verbally consented to this TeleHealth visit. The patient is located at home, the provider is located at home. The participants in this visit include the listed provider and patient. The visit was conducted today via Mychart video.  Chief Complaint: OBESITY Leah Gilmore is here to discuss her progress with her obesity treatment plan along with follow-up of her obesity related diagnoses. Leah Gilmore is on practicing portion control and making smarter food choices, such as increasing vegetables and decreasing simple carbohydrates and states she is following her eating plan approximately 100% of the time. Leah Gilmore states she is doing physical activity 30-60 minutes 1-2 times per week.  Today's visit was #: 42 Starting weight: 209 lbs Starting date: 04/30/2019  Interim History: Patient recently had a bone marrow biopsy, it was negative. Her weight of 139 today on our scale. Home life has been busy, she has been bringing food with her to the office , as she is going into the office more frequently. She has not been able to get up to 3 times a week of activity. Wants to get more in and work on protein intake over the next few weeks  Subjective:   1. Vitamin D deficiency Leah Gilmore is currently on prescription Vit D. Her last Vit D level of 26 in February. Notes fatigue.   2. Insulin resistance Leah Gilmore is doing very well on Ozempic. Denies GI side effects. She does sometimes forget to take medication as strictly as she did, still taking medication weekly consistently.  Assessment/Plan:   1. Vitamin D deficiency We will refill Vit D 50K IU/weekly for 1 month with zero refills.  -Refill Vitamin D, Ergocalciferol, (DRISDOL) 1.25 MG (50000 UNIT) CAPS capsule; TAKE 1 CAPSULE (50,000 UNITS TOTAL) BY MOUTH ONCE A  WEEK.  Dispense: 4 capsule; Refill: 0  2. Insulin resistance We will refill Ozempic 1 mg subcutaneous weekly for 1 month with zero refills.  -Refill Semaglutide, 1 MG/DOSE, 4 MG/3ML SOPN; Inject 1 mg as directed once a week.  Dispense: 3 mL; Refill: 0  3. Obesity with current BMI of 21.5 Leah Gilmore is currently in the action stage of change. As such, her goal is to continue with weight loss efforts. She has agreed to practicing portion control and making smarter food choices, such as increasing vegetables and decreasing simple carbohydrates.   Exercise goals: All adults should avoid inactivity. Some physical activity is better than none, and adults who participate in any amount of physical activity gain some health benefits. Increase activity to 3 times a week.  Behavioral modification strategies: increasing lean protein intake, meal planning and cooking strategies, keeping healthy foods in the home, and planning for success.  Leah Gilmore has agreed to follow-up with our clinic in 4 weeks. She was informed of the importance of frequent follow-up visits to maximize her success with intensive lifestyle modifications for her multiple health conditions.  Objective:   VITALS: Per patient if applicable, see vitals. GENERAL: Alert and in no acute distress. CARDIOPULMONARY: No increased WOB. Speaking in clear sentences.  PSYCH: Pleasant and cooperative. Speech normal rate and rhythm. Affect is appropriate. Insight and judgement are appropriate. Attention is focused, linear, and appropriate.  NEURO: Oriented as arrived to appointment on time with no prompting.   Lab Results  Component Value Date   CREATININE 0.69 04/20/2021  BUN 14 04/20/2021   NA 140 04/20/2021   K 4.2 04/20/2021   CL 107 04/20/2021   CO2 28 04/20/2021   Lab Results  Component Value Date   ALT 18 04/20/2021   AST 21 04/20/2021   ALKPHOS 53 03/19/2020   BILITOT 0.8 04/20/2021   Lab Results  Component Value Date   HGBA1C 4.4  04/20/2021   HGBA1C 4.8 05/01/2019   HGBA1C 4.8 04/30/2019   No results found for: INSULIN Lab Results  Component Value Date   TSH 2.14 04/17/2019   Lab Results  Component Value Date   CHOL 164 04/20/2021   HDL 79 04/20/2021   LDLCALC 71 04/20/2021   TRIG 48 04/20/2021   CHOLHDL 2.1 04/20/2021   Lab Results  Component Value Date   VD25OH 26 (L) 04/20/2021   VD25OH 37 03/19/2020   VD25OH 18 (L) 04/17/2019   Lab Results  Component Value Date   WBC 2.1 (L) 06/25/2021   HGB 11.1 (L) 06/25/2021   HCT 34.5 (L) 06/25/2021   MCV 86.7 06/25/2021   PLT 240 06/25/2021   Lab Results  Component Value Date   IRON 51 04/20/2021   TIBC 380 04/20/2021   FERRITIN 20 04/20/2021    Attestation Statements:   Reviewed by clinician on day of visit: allergies, medications, problem list, medical history, surgical history, family history, social history, and previous encounter notes.  I, Elnora Morrison, RMA am acting as transcriptionist for Coralie Common, MD.  I have reviewed the above documentation for accuracy and completeness, and I agree with the above. - Coralie Common, MD

## 2021-08-12 ENCOUNTER — Ambulatory Visit (INDEPENDENT_AMBULATORY_CARE_PROVIDER_SITE_OTHER): Payer: No Typology Code available for payment source | Admitting: Family Medicine

## 2021-08-12 ENCOUNTER — Encounter (INDEPENDENT_AMBULATORY_CARE_PROVIDER_SITE_OTHER): Payer: Self-pay | Admitting: Family Medicine

## 2021-08-12 VITALS — BP 100/58 | HR 65 | Temp 98.3°F | Ht 67.0 in | Wt 143.0 lb

## 2021-08-12 DIAGNOSIS — Z6822 Body mass index (BMI) 22.0-22.9, adult: Secondary | ICD-10-CM | POA: Diagnosis not present

## 2021-08-12 DIAGNOSIS — E669 Obesity, unspecified: Secondary | ICD-10-CM | POA: Diagnosis not present

## 2021-08-12 DIAGNOSIS — D508 Other iron deficiency anemias: Secondary | ICD-10-CM | POA: Diagnosis not present

## 2021-08-12 DIAGNOSIS — E8881 Metabolic syndrome: Secondary | ICD-10-CM

## 2021-08-12 DIAGNOSIS — Z7985 Long-term (current) use of injectable non-insulin antidiabetic drugs: Secondary | ICD-10-CM

## 2021-08-12 DIAGNOSIS — Z9189 Other specified personal risk factors, not elsewhere classified: Secondary | ICD-10-CM

## 2021-08-12 MED ORDER — SEMAGLUTIDE (1 MG/DOSE) 4 MG/3ML ~~LOC~~ SOPN: 1.0000 mg | PEN_INJECTOR | SUBCUTANEOUS | 0 refills | Status: DC

## 2021-08-14 NOTE — Progress Notes (Unsigned)
Chief Complaint:   OBESITY Leah Gilmore is here to discuss her progress with her obesity treatment plan along with follow-up of her obesity related diagnoses. Leah Gilmore is on practicing portion control and making smarter food choices, such as increasing vegetables and decreasing simple carbohydrates and states she is following her eating plan approximately 25% of the time. Leah Gilmore states she is exercising 0 minutes 0 times per week.  Today's visit was #: 33 Starting weight: 209 lbs Starting date: 04/30/2019 Today's weight: 143 lbs Today's date: 08/12/2021 Total lbs lost to date: 66 lbs Total lbs lost since last in-office visit: 6 lbs  Interim History: Leah Gilmore is feeling hungry more frequently. Eating and drinking more frequent indulgences. She is drinking Sprite more frequently. She is eating every 2-3 hours and not being as mindful of food choices. She is wondering if she needs to increase dose of Ozempic.  Subjective:   1. Insulin resistance Leah Gilmore is doing reasonably on Ozempic. She is on 1 mg dose with no GI side effects. Her last A1c at 4.4.  2. Other iron deficiency anemia Leah Gilmore sees a hematology/oncology. Her ferritin at 13 on last draw and iron 51. MCV within normal limits.   3. At risk for malnutrition Leah Gilmore is at increased risk for malnutrition due to food choices.  Assessment/Plan:   1. Insulin resistance We will refill Ozempic 1 mg subcutaneous once weekly for 1 month with 0 refills.  -Refill Semaglutide, 1 MG/DOSE, 4 MG/3ML SOPN; Inject 1 mg as directed once a week.  Dispense: 3 mL; Refill: 0  2. Other iron deficiency anemia Leah Gilmore will follow up with Dr. Pamelia Hoit in August.  3. At risk for malnutrition Leah Gilmore was given approximately 15 minutes of counseling today regarding prevention of malnutrition and ways to meet macronutrient goals..   4. Obesity with current BMI of 22.4 Leah Gilmore is currently in the action stage of change. As such, her goal is to continue with weight loss efforts. She  has agreed to the Category 3 Plan and keeping a food journal and adhering to recommended goals of 1350-1500 calories and 95+ grams of protein daily.   Exercise goals: All adults should avoid inactivity. Some physical activity is better than none, and adults who participate in any amount of physical activity gain some health benefits.  Behavioral modification strategies: increasing lean protein intake, meal planning and cooking strategies, keeping healthy foods in the home, and planning for success.  Leah Gilmore has agreed to follow-up with our clinic in 4 weeks. She was informed of the importance of frequent follow-up visits to maximize her success with intensive lifestyle modifications for her multiple health conditions.   Objective:   Blood pressure (!) 100/58, pulse 65, temperature 98.3 F (36.8 C), height 5\' 7"  (1.702 m), weight 143 lb (64.9 kg), SpO2 96 %. Body mass index is 22.4 kg/m.  General: Cooperative, alert, well developed, in no acute distress. HEENT: Conjunctivae and lids unremarkable. Cardiovascular: Regular rhythm.  Lungs: Normal work of breathing. Neurologic: No focal deficits.   Lab Results  Component Value Date   CREATININE 0.69 04/20/2021   BUN 14 04/20/2021   NA 140 04/20/2021   K 4.2 04/20/2021   CL 107 04/20/2021   CO2 28 04/20/2021   Lab Results  Component Value Date   ALT 18 04/20/2021   AST 21 04/20/2021   ALKPHOS 53 03/19/2020   BILITOT 0.8 04/20/2021   Lab Results  Component Value Date   HGBA1C 4.4 04/20/2021   HGBA1C 4.8  05/01/2019   HGBA1C 4.8 04/30/2019   No results found for: "INSULIN" Lab Results  Component Value Date   TSH 2.14 04/17/2019   Lab Results  Component Value Date   CHOL 164 04/20/2021   HDL 79 04/20/2021   LDLCALC 71 04/20/2021   TRIG 48 04/20/2021   CHOLHDL 2.1 04/20/2021   Lab Results  Component Value Date   VD25OH 26 (L) 04/20/2021   VD25OH 37 03/19/2020   VD25OH 18 (L) 04/17/2019   Lab Results  Component Value  Date   WBC 2.1 (L) 06/25/2021   HGB 11.1 (L) 06/25/2021   HCT 34.5 (L) 06/25/2021   MCV 86.7 06/25/2021   PLT 240 06/25/2021   Lab Results  Component Value Date   IRON 51 04/20/2021   TIBC 380 04/20/2021   FERRITIN 20 04/20/2021   Attestation Statements:   Reviewed by clinician on day of visit: allergies, medications, problem list, medical history, surgical history, family history, social history, and previous encounter notes.  I, Fortino Sic, RMA am acting as transcriptionist for Reuben Likes, MD.  I have reviewed the above documentation for accuracy and completeness, and I agree with the above. -  ***

## 2021-09-14 ENCOUNTER — Ambulatory Visit (INDEPENDENT_AMBULATORY_CARE_PROVIDER_SITE_OTHER): Payer: No Typology Code available for payment source | Admitting: Family Medicine

## 2021-09-29 ENCOUNTER — Encounter: Payer: Self-pay | Admitting: Hematology and Oncology

## 2021-09-30 ENCOUNTER — Other Ambulatory Visit: Payer: Self-pay | Admitting: *Deleted

## 2021-09-30 DIAGNOSIS — D509 Iron deficiency anemia, unspecified: Secondary | ICD-10-CM

## 2021-09-30 DIAGNOSIS — D508 Other iron deficiency anemias: Secondary | ICD-10-CM

## 2021-09-30 NOTE — Progress Notes (Signed)
Per pt request RN successfully faxed lab orders to [facility] [phone]).

## 2021-10-07 ENCOUNTER — Other Ambulatory Visit: Payer: No Typology Code available for payment source

## 2021-10-07 ENCOUNTER — Inpatient Hospital Stay
Payer: No Typology Code available for payment source | Attending: Hematology and Oncology | Admitting: Hematology and Oncology

## 2021-10-07 NOTE — Assessment & Plan Note (Deleted)
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 She has chronic iron deficiency IV iron: October 2019, February 2020  Labs  11/14/2017: Ferritin 9(IV iron October 2019) 03/19/2020: Ferritin 5, iron saturation 6%, hemoglobin 9.6 04/20/21: WBC 2.5, Hb 9.7, ANC: 1.07, Vit D 26, B 12: 674, Iron sat: 13%, ferritin 20, Cr: 0.69   I discussed the lab results which show leukopenia along with her anemia. This could be ethnicity related or cyclical. Erythropoietin: 8.3, LDH 117, reticulocyte 0.7%  Leukopenia: Primarily neutropenia:: 06/25/2021: WBC 2.1, ANC 0.8, ANA negative, B12 545 Bone marrow biopsy 06/25/2021: Normocellular bone marrow with trilineage hematopoiesis.  No leukemia or lymphoma identified.  Cytogenetics normal 46 xx  Based on all of these findings we attributed her leukopenia to slight clinical/ethnicity related causes.

## 2021-10-13 ENCOUNTER — Encounter (INDEPENDENT_AMBULATORY_CARE_PROVIDER_SITE_OTHER): Payer: Self-pay

## 2021-10-14 ENCOUNTER — Ambulatory Visit (INDEPENDENT_AMBULATORY_CARE_PROVIDER_SITE_OTHER): Payer: No Typology Code available for payment source | Admitting: Family Medicine

## 2021-10-14 ENCOUNTER — Encounter (INDEPENDENT_AMBULATORY_CARE_PROVIDER_SITE_OTHER): Payer: Self-pay | Admitting: Family Medicine

## 2021-10-14 ENCOUNTER — Encounter (INDEPENDENT_AMBULATORY_CARE_PROVIDER_SITE_OTHER): Payer: Self-pay

## 2021-10-14 VITALS — BP 89/60 | HR 62 | Temp 98.3°F | Ht 67.0 in | Wt 146.0 lb

## 2021-10-14 DIAGNOSIS — E669 Obesity, unspecified: Secondary | ICD-10-CM | POA: Diagnosis not present

## 2021-10-14 DIAGNOSIS — Z6822 Body mass index (BMI) 22.0-22.9, adult: Secondary | ICD-10-CM

## 2021-10-14 DIAGNOSIS — E8881 Metabolic syndrome: Secondary | ICD-10-CM | POA: Diagnosis not present

## 2021-10-14 DIAGNOSIS — E559 Vitamin D deficiency, unspecified: Secondary | ICD-10-CM

## 2021-10-14 DIAGNOSIS — Z7985 Long-term (current) use of injectable non-insulin antidiabetic drugs: Secondary | ICD-10-CM

## 2021-10-14 DIAGNOSIS — R0602 Shortness of breath: Secondary | ICD-10-CM | POA: Diagnosis not present

## 2021-10-14 MED ORDER — VITAMIN D (ERGOCALCIFEROL) 1.25 MG (50000 UNIT) PO CAPS: ORAL_CAPSULE | ORAL | 0 refills | Status: DC

## 2021-10-14 MED ORDER — SEMAGLUTIDE (1 MG/DOSE) 4 MG/3ML ~~LOC~~ SOPN: 1.0000 mg | PEN_INJECTOR | SUBCUTANEOUS | 0 refills | Status: DC

## 2021-10-15 ENCOUNTER — Encounter (HOSPITAL_COMMUNITY): Payer: Self-pay | Admitting: *Deleted

## 2021-10-19 NOTE — Progress Notes (Signed)
Chief Complaint:   OBESITY Leah Gilmore is here to discuss her progress with her obesity treatment plan along with follow-up of her obesity related diagnoses. Leah Gilmore is on the Category 3 Plan and states she is following her eating plan approximately 0% of the time. Leah Gilmore states she is exercising 0 minutes 0 times per week.  Today's visit was #: 34 Starting weight: 209 lbs Starting date: 04/30/2019 Today's weight: 146 lbs Today's date: 10/14/2021 Total lbs lost to date: 63 lbs Total lbs lost since last in-office visit: 0  Interim History: Leah Gilmore returned from beach last night--a 4 day trip. Otherwise summer has been busy with kids. Recognizes that with Ozempic she gets used to limited quantity in terms of intake. She does not have any food at home currently.  Subjective:   1. SOBOE (shortness of breath on exertion) Anberlyn's IC on 04/30/19 was of 1839. Her symptoms have improved from 2 years ago, but have worsened recently.  2. Vitamin D deficiency Leah Gilmore denies any nausea, vomiting or muscle weakness. She notes fatigue. Vit D level from Feb of 26.  3. Insulin resistance Leah Gilmore is currently taking Ozempic without GI symptoms. Her last A1c was from Feb of 4.4.  Assessment/Plan:   1. SOBOE (shortness of breath on exertion) IC reviewed today of 1426.   2. Vitamin D deficiency We will obtain labs today. We will refill Vit D 50,00 IU once a week for 1 month with 0 refills.  - Refill Vitamin D, Ergocalciferol, (DRISDOL) 1.25 MG (50000 UNIT) CAPS capsule; TAKE 1 CAPSULE (50,000 UNITS TOTAL) BY MOUTH ONCE A WEEK.  Dispense: 4 capsule; Refill: 0 - VITAMIN D 25 Hydroxy (Vit-D Deficiency, Fractures)  3. Insulin resistance We will refill Semaglutide 1 mg SubQ once weekly for 1 month with 0 refills.  -Refill Semaglutide, 1 MG/DOSE, 4 MG/3ML SOPN; Inject 1 mg as directed once a week.  Dispense: 3 mL; Refill: 0  4. Obesity with current BMI of 22.9 Leah Gilmore is currently in the action stage of change. As such, her  goal is to continue with weight loss efforts. She has agreed to the Category 3 Plan with 8 oz at night.   Exercise goals: All adults should avoid inactivity. Some physical activity is better than none, and adults who participate in any amount of physical activity gain some health benefits.  Behavioral modification strategies: increasing lean protein intake, meal planning and cooking strategies, keeping healthy foods in the home, and planning for success.  Leah Gilmore has agreed to follow-up with our clinic in 5 weeks. She was informed of the importance of frequent follow-up visits to maximize her success with intensive lifestyle modifications for her multiple health conditions.   Leah Gilmore was informed we would discuss her lab results at her next visit unless there is a critical issue that needs to be addressed sooner. Leah Gilmore agreed to keep her next visit at the agreed upon time to discuss these results.  Objective:   Blood pressure (!) 89/60, pulse 62, temperature 98.3 F (36.8 C), height 5\' 7"  (1.702 m), weight 146 lb (66.2 kg), SpO2 100 %. Body mass index is 22.87 kg/m.  General: Cooperative, alert, well developed, in no acute distress. HEENT: Conjunctivae and lids unremarkable. Cardiovascular: Regular rhythm.  Lungs: Normal work of breathing. Neurologic: No focal deficits.   Lab Results  Component Value Date   CREATININE 0.69 04/20/2021   BUN 14 04/20/2021   NA 140 04/20/2021   K 4.2 04/20/2021   CL 107 04/20/2021  CO2 28 04/20/2021   Lab Results  Component Value Date   ALT 18 04/20/2021   AST 21 04/20/2021   ALKPHOS 53 03/19/2020   BILITOT 0.8 04/20/2021   Lab Results  Component Value Date   HGBA1C 4.4 04/20/2021   HGBA1C 4.8 05/01/2019   HGBA1C 4.8 04/30/2019   No results found for: "INSULIN" Lab Results  Component Value Date   TSH 2.14 04/17/2019   Lab Results  Component Value Date   CHOL 164 04/20/2021   HDL 79 04/20/2021   LDLCALC 71 04/20/2021   TRIG 48 04/20/2021    CHOLHDL 2.1 04/20/2021   Lab Results  Component Value Date   VD25OH 26 (L) 04/20/2021   VD25OH 37 03/19/2020   VD25OH 18 (L) 04/17/2019   Lab Results  Component Value Date   WBC 2.1 (L) 06/25/2021   HGB 11.1 (L) 06/25/2021   HCT 34.5 (L) 06/25/2021   MCV 86.7 06/25/2021   PLT 240 06/25/2021   Lab Results  Component Value Date   IRON 51 04/20/2021   TIBC 380 04/20/2021   FERRITIN 20 04/20/2021   Attestation Statements:   Reviewed by clinician on day of visit: allergies, medications, problem list, medical history, surgical history, family history, social history, and previous encounter notes.  I, Fortino Sic, RMA am acting as transcriptionist for Reuben Likes, MD.  I have reviewed the above documentation for accuracy and completeness, and I agree with the above. - Reuben Likes, MD

## 2021-11-11 ENCOUNTER — Ambulatory Visit (INDEPENDENT_AMBULATORY_CARE_PROVIDER_SITE_OTHER): Payer: No Typology Code available for payment source | Admitting: Family Medicine

## 2021-11-11 ENCOUNTER — Encounter (INDEPENDENT_AMBULATORY_CARE_PROVIDER_SITE_OTHER): Payer: Self-pay | Admitting: Family Medicine

## 2021-11-11 VITALS — BP 104/56 | HR 75 | Temp 98.2°F | Ht 67.0 in | Wt 143.0 lb

## 2021-11-11 DIAGNOSIS — E559 Vitamin D deficiency, unspecified: Secondary | ICD-10-CM | POA: Diagnosis not present

## 2021-11-11 DIAGNOSIS — Z6822 Body mass index (BMI) 22.0-22.9, adult: Secondary | ICD-10-CM | POA: Diagnosis not present

## 2021-11-11 DIAGNOSIS — E8881 Metabolic syndrome: Secondary | ICD-10-CM | POA: Diagnosis not present

## 2021-11-11 DIAGNOSIS — E669 Obesity, unspecified: Secondary | ICD-10-CM | POA: Diagnosis not present

## 2021-11-11 MED ORDER — VITAMIN D (ERGOCALCIFEROL) 1.25 MG (50000 UNIT) PO CAPS: ORAL_CAPSULE | ORAL | 0 refills | Status: DC

## 2021-11-11 MED ORDER — SEMAGLUTIDE (1 MG/DOSE) 4 MG/3ML ~~LOC~~ SOPN: 1.0000 mg | PEN_INJECTOR | SUBCUTANEOUS | 0 refills | Status: DC

## 2021-11-13 NOTE — Progress Notes (Signed)
Chief Complaint:   OBESITY Leah Gilmore is here to discuss her progress with her obesity treatment plan along with follow-up of her obesity related diagnoses. Leah Gilmore is on the Category 3 Plan and states she is following her eating plan approximately 80% of the time. Leah Gilmore states she is exercise 0 minutes 0 times per week.  Today's visit was #: 35 Starting weight: 209 lbs Starting date: 04/30/2019 Today's weight: 143 lbs Today's date: 11/11/2021 Total lbs lost to date: 66 lbs Total lbs lost since last in-office visit: 3  Interim History: Leah Gilmore has been very busy with kids starting school and trying navigate all drop offs and pick ups. Has been eating low carb most of the time. Still having an ongoing conversation with her wife about her weight loss. Has been doing Dannon drinkable yogurt.  Subjective:   1. Vitamin D deficiency Leah Gilmore is currently taking prescription Vit D 50,000 IU once a week. Denies any nausea, vomiting or muscle weakness.  She notes fatigue. Her last Vit D 26.  2. Insulin resistance Leah Gilmore is on Ozempic weekly. A1c and insulin within normal limits.  Assessment/Plan:   1. Vitamin D deficiency We will obtain labs today. We will refill Vit D 50k IU once a week for 1 month with 0 refills.  - VITAMIN D 25 Hydroxy (Vit-D Deficiency, Fractures)  -Refill Vitamin D, Ergocalciferol, (DRISDOL) 1.25 MG (50000 UNIT) CAPS capsule; TAKE 1 CAPSULE (50,000 UNITS TOTAL) BY MOUTH ONCE A WEEK.  Dispense: 4 capsule; Refill: 0  2. Insulin resistance We will refill Ozempic 1 mg SubQ once weekly for 1 month with 0 refills.  -Refill Semaglutide, 1 MG/DOSE, 4 MG/3ML SOPN; Inject 1 mg as directed once a week.  Dispense: 3 mL; Refill: 0  3. Obesity with current BMI of 22.5 Leah Gilmore is currently in the action stage of change. As such, her goal is to continue with weight loss efforts. She has agreed to following a lower carbohydrate, vegetable and lean protein rich diet plan.   Exercise goals: All adults  should avoid inactivity. Some physical activity is better than none, and adults who participate in any amount of physical activity gain some health benefits.  Behavioral modification strategies: increasing lean protein intake, meal planning and cooking strategies, keeping healthy foods in the home, and planning for success.  Leah Gilmore has agreed to follow-up with our clinic in 4 weeks. She was informed of the importance of frequent follow-up visits to maximize her success with intensive lifestyle modifications for her multiple health conditions.   Objective:   Blood pressure (!) 104/56, pulse 75, temperature 98.2 F (36.8 C), height 5\' 7"  (1.702 m), weight 143 lb (64.9 kg), SpO2 99 %. Body mass index is 22.4 kg/m.  General: Cooperative, alert, well developed, in no acute distress. HEENT: Conjunctivae and lids unremarkable. Cardiovascular: Regular rhythm.  Lungs: Normal work of breathing. Neurologic: No focal deficits.   Lab Results  Component Value Date   CREATININE 0.69 04/20/2021   BUN 14 04/20/2021   NA 140 04/20/2021   K 4.2 04/20/2021   CL 107 04/20/2021   CO2 28 04/20/2021   Lab Results  Component Value Date   ALT 18 04/20/2021   AST 21 04/20/2021   ALKPHOS 53 03/19/2020   BILITOT 0.8 04/20/2021   Lab Results  Component Value Date   HGBA1C 4.4 04/20/2021   HGBA1C 4.8 05/01/2019   HGBA1C 4.8 04/30/2019   No results found for: "INSULIN" Lab Results  Component Value Date  TSH 2.14 04/17/2019   Lab Results  Component Value Date   CHOL 164 04/20/2021   HDL 79 04/20/2021   LDLCALC 71 04/20/2021   TRIG 48 04/20/2021   CHOLHDL 2.1 04/20/2021   Lab Results  Component Value Date   VD25OH 26 (L) 04/20/2021   VD25OH 37 03/19/2020   VD25OH 18 (L) 04/17/2019   Lab Results  Component Value Date   WBC 2.1 (L) 06/25/2021   HGB 11.1 (L) 06/25/2021   HCT 34.5 (L) 06/25/2021   MCV 86.7 06/25/2021   PLT 240 06/25/2021   Lab Results  Component Value Date   IRON 51  04/20/2021   TIBC 380 04/20/2021   FERRITIN 20 04/20/2021   Attestation Statements:   Reviewed by clinician on day of visit: allergies, medications, problem list, medical history, surgical history, family history, social history, and previous encounter notes.  I, Fortino Sic, RMA am acting as transcriptionist for Reuben Likes, MD.  I have reviewed the above documentation for accuracy and completeness, and I agree with the above. - Reuben Likes, MD

## 2021-11-30 ENCOUNTER — Encounter: Payer: Self-pay | Admitting: *Deleted

## 2021-12-06 ENCOUNTER — Encounter (INDEPENDENT_AMBULATORY_CARE_PROVIDER_SITE_OTHER): Payer: Self-pay | Admitting: Family Medicine

## 2021-12-07 NOTE — Telephone Encounter (Signed)
Please reschedule pt, Thanks

## 2021-12-09 ENCOUNTER — Ambulatory Visit (INDEPENDENT_AMBULATORY_CARE_PROVIDER_SITE_OTHER): Payer: No Typology Code available for payment source | Admitting: Family Medicine

## 2021-12-14 ENCOUNTER — Ambulatory Visit (INDEPENDENT_AMBULATORY_CARE_PROVIDER_SITE_OTHER): Payer: No Typology Code available for payment source | Admitting: Family Medicine

## 2021-12-14 NOTE — Telephone Encounter (Signed)
Please reschedule appt, Thanks

## 2021-12-23 ENCOUNTER — Encounter (INDEPENDENT_AMBULATORY_CARE_PROVIDER_SITE_OTHER): Payer: Self-pay | Admitting: Family Medicine

## 2022-01-05 ENCOUNTER — Encounter (INDEPENDENT_AMBULATORY_CARE_PROVIDER_SITE_OTHER): Payer: Self-pay | Admitting: Family Medicine

## 2022-01-05 ENCOUNTER — Ambulatory Visit (INDEPENDENT_AMBULATORY_CARE_PROVIDER_SITE_OTHER): Payer: No Typology Code available for payment source | Admitting: Family Medicine

## 2022-01-05 VITALS — BP 97/63 | HR 64 | Temp 98.3°F | Ht 67.0 in | Wt 154.0 lb

## 2022-01-05 DIAGNOSIS — F32A Depression, unspecified: Secondary | ICD-10-CM

## 2022-01-05 DIAGNOSIS — E669 Obesity, unspecified: Secondary | ICD-10-CM

## 2022-01-05 DIAGNOSIS — Z6824 Body mass index (BMI) 24.0-24.9, adult: Secondary | ICD-10-CM

## 2022-01-05 DIAGNOSIS — F419 Anxiety disorder, unspecified: Secondary | ICD-10-CM

## 2022-01-05 DIAGNOSIS — E88819 Insulin resistance, unspecified: Secondary | ICD-10-CM

## 2022-01-05 MED ORDER — SEMAGLUTIDE (2 MG/DOSE) 8 MG/3ML ~~LOC~~ SOPN: 2.0000 mg | PEN_INJECTOR | SUBCUTANEOUS | 0 refills | Status: DC

## 2022-01-06 NOTE — Progress Notes (Signed)
Chief Complaint:   OBESITY Leah Gilmore is here to discuss her progress with her obesity treatment plan along with follow-up of her obesity related diagnoses. Leah Gilmore is on following a lower carbohydrate, vegetable and lean protein rich diet plan and states she is following her eating plan approximately 0% of the time. Leah Gilmore states she is exercising 0 minutes 0 times per week.  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  Subjective:   1. Insulin resistance Leah Gilmore is on Semaglutide with good carb intake control. Denies GI side effects.  2. Anxiety and depression Leah Gilmore is taking Sertraline with some improvement in symptoms. Denies suicidal ideas, and homicidal ideas.  Assessment/Plan:   1. Insulin resistance We will increase/refill Ozempic to 2 mg weekly for 1 month with 0 refills.  -Increase/Refill Semaglutide, 2 MG/DOSE, 8 MG/3ML SOPN; Inject 2 mg as directed once a week.  Dispense: 9 mL; Refill: 0  2. Anxiety and depression Continue Zoloft without changes in dose.  3. Obesity with current BMI of 24.2 Leah Gilmore is currently in the action stage of change. As such, her goal is to continue with weight loss efforts. She has agreed to following a lower carbohydrate, vegetable and lean protein rich diet plan.   Exercise goals: All adults should avoid inactivity. Some physical activity is better than none, and adults who participate in any amount of physical activity gain some health benefits.  Behavioral modification strategies: increasing lean protein intake, meal planning and cooking strategies, keeping healthy foods in the home, and planning for success.  Leah Gilmore has agreed to follow-up with our clinic in 5 weeks. She was  informed of the importance of frequent follow-up visits to maximize her success with intensive lifestyle modifications for her multiple health conditions.   Objective:   Blood pressure 97/63, pulse 64, temperature 98.3 F (36.8 C), height 5\' 7"  (1.702 m), weight 154 lb (69.9 kg), SpO2 97 %. Body mass index is 24.12 kg/m.  General: Cooperative, alert, well developed, in no acute distress. HEENT: Conjunctivae and lids unremarkable. Cardiovascular: Regular rhythm.  Lungs: Normal work of breathing. Neurologic: No focal deficits.   Lab Results  Component Value Date   CREATININE 0.69 04/20/2021   BUN 14 04/20/2021   NA 140 04/20/2021   K 4.2 04/20/2021   CL 107 04/20/2021   CO2 28 04/20/2021   Lab Results  Component Value Date   ALT 18 04/20/2021   AST 21 04/20/2021   ALKPHOS 53 03/19/2020   BILITOT 0.8 04/20/2021   Lab Results  Component Value Date   HGBA1C 4.4 04/20/2021   HGBA1C 4.8 05/01/2019   HGBA1C 4.8 04/30/2019   No results found for: "INSULIN" Lab Results  Component Value Date   TSH 2.14 04/17/2019   Lab Results  Component Value Date   CHOL 164 04/20/2021   HDL 79 04/20/2021   LDLCALC 71 04/20/2021   TRIG 48 04/20/2021   CHOLHDL 2.1 04/20/2021   Lab Results  Component Value Date   VD25OH 26 (L) 04/20/2021   VD25OH 37 03/19/2020   VD25OH 18 (L) 04/17/2019   Lab Results  Component Value Date   WBC 2.1 (L) 06/25/2021   HGB 11.1 (L) 06/25/2021   HCT 34.5 (  L) 06/25/2021   MCV 86.7 06/25/2021   PLT 240 06/25/2021   Lab Results  Component Value Date   IRON 51 04/20/2021   TIBC 380 04/20/2021   FERRITIN 20 04/20/2021   Attestation Statements:   Reviewed by clinician on day of visit: allergies, medications, problem list, medical history, surgical history, family history, social history, and previous encounter notes.  I, Fortino Sic, RMA am acting as transcriptionist for Reuben Likes, MD.  I have reviewed the above documentation for  accuracy and completeness, and I agree with the above. - Reuben Likes, MD

## 2022-01-13 ENCOUNTER — Ambulatory Visit (INDEPENDENT_AMBULATORY_CARE_PROVIDER_SITE_OTHER): Payer: No Typology Code available for payment source | Admitting: Family Medicine

## 2022-02-10 ENCOUNTER — Encounter (INDEPENDENT_AMBULATORY_CARE_PROVIDER_SITE_OTHER): Payer: Self-pay | Admitting: Family Medicine

## 2022-02-10 ENCOUNTER — Telehealth (INDEPENDENT_AMBULATORY_CARE_PROVIDER_SITE_OTHER): Payer: No Typology Code available for payment source | Admitting: Family Medicine

## 2022-02-10 DIAGNOSIS — E669 Obesity, unspecified: Secondary | ICD-10-CM | POA: Diagnosis not present

## 2022-02-10 DIAGNOSIS — E559 Vitamin D deficiency, unspecified: Secondary | ICD-10-CM

## 2022-02-10 DIAGNOSIS — Z6824 Body mass index (BMI) 24.0-24.9, adult: Secondary | ICD-10-CM | POA: Diagnosis not present

## 2022-02-10 DIAGNOSIS — E88819 Insulin resistance, unspecified: Secondary | ICD-10-CM | POA: Diagnosis not present

## 2022-02-10 MED ORDER — VITAMIN D (ERGOCALCIFEROL) 1.25 MG (50000 UNIT) PO CAPS: ORAL_CAPSULE | ORAL | 0 refills | Status: DC

## 2022-02-10 NOTE — Telephone Encounter (Signed)
Please reschedule pt, Thanks

## 2022-02-16 ENCOUNTER — Ambulatory Visit (INDEPENDENT_AMBULATORY_CARE_PROVIDER_SITE_OTHER): Payer: No Typology Code available for payment source | Admitting: Family Medicine

## 2022-02-18 ENCOUNTER — Encounter: Payer: Self-pay | Admitting: *Deleted

## 2022-02-24 NOTE — Progress Notes (Signed)
TeleHealth Visit:  Due to the COVID-19 pandemic, this visit was completed with telemedicine (audio/video) technology to reduce patient and provider exposure as well as to preserve personal protective equipment.   Leah Gilmore has verbally consented to this TeleHealth visit. The patient is located at home, the provider is located at the [facility] office. The participants in this visit include the listed provider and patient. The visit was conducted today via MyChart Video.    Chief Complaint: OBESITY Leah Gilmore is here to discuss her progress with her obesity treatment plan along with follow-up of her obesity related diagnoses. Leah Gilmore is on following a lower carbohydrate, vegetable and lean protein rich diet plan and states she is following her eating plan approximately 50% of the time. Leah Gilmore states she is walking 2 miles 7 times per week.  Today's visit was #: 37 Starting weight: 209 lbs Starting date: 04/30/2019  Interim History: Leah Gilmore son is sick with GI illness. Weight of 146 lbs this am. Has been trying to stick to low carb. Has been having more cravings recently particularly for chocolate. Going to stay local for Thanksgiving. Stays so busy she sometimes does not eat.  Subjective:   1. Vitamin D deficiency Leah Gilmore is currently taking prescription Vit D 50,000 IU once a week. Denies any nausea, vomiting or muscle weakness. She notes fatigue.  2. Insulin resistance Leah Gilmore is on Ozempic with good control of carb cravings. Denies GI side effects.  Assessment/Plan:   1. Vitamin D deficiency We will refill Vit D 50K IU once a week for 1 month with 0 refills.  -Refill Vitamin D, Ergocalciferol, (DRISDOL) 1.25 MG (50000 UNIT) CAPS capsule; TAKE 1 CAPSULE (50,000 UNITS TOTAL) BY MOUTH ONCE A WEEK.  Dispense: 4 capsule; Refill: 0  2. Insulin resistance Follow up labs with PCP.  3. Obesity with current BMI of 24.2 Leah Gilmore is currently in the action stage of change.  As such, her goal is to continue with weight loss efforts. She has agreed to following a lower carbohydrate, vegetable and lean protein rich diet plan.   Exercise goals: All adults should avoid inactivity. Some physical activity is better than none, and adults who participate in any amount of physical activity gain some health benefits.  Behavioral modification strategies: increasing lean protein intake, meal planning and cooking strategies, keeping healthy foods in the home, and planning for success.  Leah Gilmore has agreed to follow-up with our clinic in 4 weeks. She was informed of the importance of frequent follow-up visits to maximize her success with intensive lifestyle modifications for her multiple health conditions.  Objective:   VITALS: Per patient if applicable, see vitals. GENERAL: Alert and in no acute distress. CARDIOPULMONARY: No increased WOB. Speaking in clear sentences.  PSYCH: Pleasant and cooperative. Speech normal rate and rhythm. Affect is appropriate. Insight and judgement are appropriate. Attention is focused, linear, and appropriate.  NEURO: Oriented as arrived to appointment on time with no prompting.   Lab Results  Component Value Date   CREATININE 0.69 04/20/2021   BUN 14 04/20/2021   NA 140 04/20/2021   K 4.2 04/20/2021   CL 107 04/20/2021   CO2 28 04/20/2021   Lab Results  Component Value Date   ALT 18 04/20/2021   AST 21 04/20/2021   ALKPHOS 53 03/19/2020   BILITOT 0.8 04/20/2021   Lab Results  Component Value Date   HGBA1C 4.4 04/20/2021   HGBA1C 4.8 05/01/2019   HGBA1C 4.8 04/30/2019  No results found for: "INSULIN" Lab Results  Component Value Date   TSH 2.14 04/17/2019   Lab Results  Component Value Date   CHOL 164 04/20/2021   HDL 79 04/20/2021   LDLCALC 71 04/20/2021   TRIG 48 04/20/2021   CHOLHDL 2.1 04/20/2021   Lab Results  Component Value Date   VD25OH 26 (L) 04/20/2021   VD25OH 37 03/19/2020   VD25OH 18 (L) 04/17/2019   Lab  Results  Component Value Date   WBC 2.1 (L) 06/25/2021   HGB 11.1 (L) 06/25/2021   HCT 34.5 (L) 06/25/2021   MCV 86.7 06/25/2021   PLT 240 06/25/2021   Lab Results  Component Value Date   IRON 51 04/20/2021   TIBC 380 04/20/2021   FERRITIN 20 04/20/2021   Attestation Statements:   Reviewed by clinician on day of visit: allergies, medications, problem list, medical history, surgical history, family history, social history, and previous encounter notes.  I, Fortino Sic, RMA am acting as transcriptionist for Reuben Likes, MD.  I have reviewed the above documentation for accuracy and completeness, and I agree with the above. - Reuben Likes, MD

## 2022-03-11 ENCOUNTER — Encounter (INDEPENDENT_AMBULATORY_CARE_PROVIDER_SITE_OTHER): Payer: Self-pay | Admitting: Family Medicine

## 2022-03-11 ENCOUNTER — Ambulatory Visit (INDEPENDENT_AMBULATORY_CARE_PROVIDER_SITE_OTHER): Payer: No Typology Code available for payment source | Admitting: Family Medicine

## 2022-03-11 VITALS — BP 100/63 | HR 67 | Temp 98.3°F | Ht 67.0 in | Wt 146.0 lb

## 2022-03-11 DIAGNOSIS — Z6832 Body mass index (BMI) 32.0-32.9, adult: Secondary | ICD-10-CM | POA: Diagnosis not present

## 2022-03-11 DIAGNOSIS — E88819 Insulin resistance, unspecified: Secondary | ICD-10-CM

## 2022-03-11 DIAGNOSIS — E559 Vitamin D deficiency, unspecified: Secondary | ICD-10-CM | POA: Diagnosis not present

## 2022-03-11 DIAGNOSIS — E669 Obesity, unspecified: Secondary | ICD-10-CM

## 2022-03-11 MED ORDER — SEMAGLUTIDE (2 MG/DOSE) 8 MG/3ML ~~LOC~~ SOPN: 2.0000 mg | PEN_INJECTOR | SUBCUTANEOUS | 0 refills | Status: DC

## 2022-03-14 ENCOUNTER — Encounter (INDEPENDENT_AMBULATORY_CARE_PROVIDER_SITE_OTHER): Payer: Self-pay | Admitting: Family Medicine

## 2022-03-18 ENCOUNTER — Encounter (INDEPENDENT_AMBULATORY_CARE_PROVIDER_SITE_OTHER): Payer: Self-pay

## 2022-03-22 NOTE — Progress Notes (Signed)
Chief Complaint:   OBESITY Leah Gilmore is here to discuss her progress with her obesity treatment plan along with follow-up of her obesity related diagnoses. Leah Gilmore is on following a lower carbohydrate, vegetable and lean protein rich diet plan and states she is following her eating plan approximately 60% of the time. Leah Gilmore states she is exercising 0 minutes 0 times per week.  Today's visit was #: 34 Starting weight: 209 lbs Starting date: 04/30/2019 Today's weight: 146 lbs Today's date: 03/11/2022 Total lbs lost to date: 63 lbs Total lbs lost since last in-office visit: 8  Interim History: Leah Gilmore's last visit was a video visit.  She has noticed that she is not always the healthiest in choices of foods.  She does not like much food and so tends to revert to foods she knows she likes.   Subjective:   1. Insulin resistance Leah Gilmore is on Ozempic 2 mg with good carb controll. Denies GI side effects.  2. Vitamin D deficiency Leah Gilmore is currently taking prescription Vit D 50,000 IU once a week.  Denies any nausea, vomiting or muscle weakness.  Assessment/Plan:   1. Insulin resistance We will refill Ozempic 2 mg SQ once a week for 1 month with 0 refills.  -Refill Semaglutide, 2 MG/DOSE, 8 MG/3ML SOPN; Inject 2 mg as directed once a week.  Dispense: 9 mL; Refill: 0  2. Vitamin D deficiency Continue Vit D, no refills needed.  3. Obesity with current BMI of 23.0 Leah Gilmore is currently in the action stage of change. As such, her goal is to continue with weight loss efforts. She has agreed to the Category 2 Plan and the [place].   Exercise goals: All adults should avoid inactivity. Some physical activity is better than none, and adults who participate in any amount of physical activity gain some health benefits.  Behavioral modification strategies: increasing lean protein intake, meal planning and cooking strategies, keeping healthy foods in the home, and planning for success.  Leah Gilmore has agreed to  follow-up with our clinic in 4 weeks. She was informed of the importance of frequent follow-up visits to maximize her success with intensive lifestyle modifications for her multiple health conditions.   Objective:   Blood pressure 100/63, pulse 67, temperature 98.3 F (36.8 C), height 5\' 7"  (1.702 m), weight 146 lb (66.2 kg), SpO2 100 %. Body mass index is 22.87 kg/m.  General: Cooperative, alert, well developed, in no acute distress. HEENT: Conjunctivae and lids unremarkable. Cardiovascular: Regular rhythm.  Lungs: Normal work of breathing. Neurologic: No focal deficits.   Lab Results  Component Value Date   CREATININE 0.69 04/20/2021   BUN 14 04/20/2021   NA 140 04/20/2021   K 4.2 04/20/2021   CL 107 04/20/2021   CO2 28 04/20/2021   Lab Results  Component Value Date   ALT 18 04/20/2021   AST 21 04/20/2021   ALKPHOS 53 03/19/2020   BILITOT 0.8 04/20/2021   Lab Results  Component Value Date   HGBA1C 4.4 04/20/2021   HGBA1C 4.8 05/01/2019   HGBA1C 4.8 04/30/2019   No results found for: "INSULIN" Lab Results  Component Value Date   TSH 2.14 04/17/2019   Lab Results  Component Value Date   CHOL 164 04/20/2021   HDL 79 04/20/2021   LDLCALC 71 04/20/2021   TRIG 48 04/20/2021   CHOLHDL 2.1 04/20/2021   Lab Results  Component Value Date   VD25OH 26 (L) 04/20/2021   VD25OH 37 03/19/2020   VD25OH  18 (L) 04/17/2019   Lab Results  Component Value Date   WBC 2.1 (L) 06/25/2021   HGB 11.1 (L) 06/25/2021   HCT 34.5 (L) 06/25/2021   MCV 86.7 06/25/2021   PLT 240 06/25/2021   Lab Results  Component Value Date   IRON 51 04/20/2021   TIBC 380 04/20/2021   FERRITIN 20 04/20/2021   Attestation Statements:   Reviewed by clinician on day of visit: allergies, medications, problem list, medical history, surgical history, family history, social history, and previous encounter notes.  I, Elnora Morrison, RMA am acting as transcriptionist for Coralie Common, MD.  I  have reviewed the above documentation for accuracy and completeness, and I agree with the above. - Coralie Common, MD

## 2022-04-08 ENCOUNTER — Encounter (INDEPENDENT_AMBULATORY_CARE_PROVIDER_SITE_OTHER): Payer: Self-pay | Admitting: Family Medicine

## 2022-04-08 ENCOUNTER — Ambulatory Visit (INDEPENDENT_AMBULATORY_CARE_PROVIDER_SITE_OTHER): Payer: No Typology Code available for payment source | Admitting: Family Medicine

## 2022-04-08 VITALS — BP 94/65 | HR 65 | Temp 98.7°F | Ht 67.0 in | Wt 151.0 lb

## 2022-04-08 DIAGNOSIS — E669 Obesity, unspecified: Secondary | ICD-10-CM | POA: Diagnosis not present

## 2022-04-08 DIAGNOSIS — E559 Vitamin D deficiency, unspecified: Secondary | ICD-10-CM

## 2022-04-08 DIAGNOSIS — Z6823 Body mass index (BMI) 23.0-23.9, adult: Secondary | ICD-10-CM

## 2022-04-08 NOTE — Progress Notes (Deleted)
Patient is just getting over COVID last week.  She is just now getting over the congestion associated with COVID.  She mentions she had back pain and leg pain.  She has not gotten the '2mg'$  Ozempic due to the pharmacy not filling the 3 month supply and having issues with finding a pharmacy to fill the Rx and also accept her savings card.  Plan to get her RX today.  Cravings have returned and ability to get food in.  She has been snacking more at night as well and recognizes she over overindulges in snack food.  She has also started [facility] thru [facility].  She recognizes she needs to start doing more movement.  Realizes the hard part is getting started.

## 2022-04-20 ENCOUNTER — Encounter: Payer: No Typology Code available for payment source | Admitting: Family Medicine

## 2022-04-20 NOTE — Progress Notes (Unsigned)
Chief Complaint:   OBESITY Leah Gilmore is here to discuss her progress with her obesity treatment plan along with follow-up of her obesity related diagnoses. Leah Gilmore is on following a lower carbohydrate, vegetable and lean protein rich diet plan and states she is following her eating plan approximately 60% of the time. Leah Gilmore states she is exercising 0 minutes 0 times per week.  Today's visit was #: 71 Starting weight: 209 lbs Starting date: 04/30/2019 Today's weight: 151 lbs Today's date: 04/08/2022 Total lbs lost to date: 58 lbs Total lbs lost since last in-office visit: 0  Interim History: Patient is just getting over [place] last week. She is just now getting over the congestion associated with COVID. She mentions she had back pain and leg pain. She has not gotten the '2mg'$  Ozempic due to the pharmacy not filling the 3 month supply and having issues with finding a pharmacy to fill the Rx and also accept her savings card. Plan to get her RX today. Cravings have returned and ability to get food in. She has been snacking more at night as well and recognizes she over overindulges in snack food. She has also started therapy thru [facility]. She recognizes she needs to start doing more movement. Realizes the hard part is getting started.   Subjective:   1. Vitamin D deficiency Leah Gilmore is currently taking prescription Vit D 50,000 IU once a week.  Denies any nausea, vomiting or muscle weakness.   She notes fatigue.  Assessment/Plan:   1. Vitamin D deficiency Continue Vit D with no refills needed.  2. Obesity with current BMI of 23.8 Leah Gilmore is currently in the action stage of change. As such, her goal is to continue with weight loss efforts. She has agreed to the Category 2 Plan.   Exercise goals: All adults should avoid inactivity. Some physical activity is better than none, and adults who participate in any amount of physical activity gain some health benefits.   3 times a week 10 minutes each  time.  Behavioral modification strategies: increasing lean protein intake, decreasing simple carbohydrates, meal planning and cooking strategies, and planning for success.  Leah Gilmore has agreed to follow-up with our clinic in 4 weeks. She was informed of the importance of frequent follow-up visits to maximize her success with intensive lifestyle modifications for her multiple health conditions.   Objective:   Blood pressure 94/65, pulse 65, temperature 98.7 F (37.1 C), height '5\' 7"'$  (1.702 m), weight 151 lb (68.5 kg), SpO2 100 %. Body mass index is 23.65 kg/m.  General: Cooperative, alert, well developed, in no acute distress. HEENT: Conjunctivae and lids unremarkable. Cardiovascular: Regular rhythm.  Lungs: Normal work of breathing. Neurologic: No focal deficits.   Lab Results  Component Value Date   CREATININE 0.69 04/20/2021   BUN 14 04/20/2021   NA 140 04/20/2021   K 4.2 04/20/2021   CL 107 04/20/2021   CO2 28 04/20/2021   Lab Results  Component Value Date   ALT 18 04/20/2021   AST 21 04/20/2021   ALKPHOS 53 03/19/2020   BILITOT 0.8 04/20/2021   Lab Results  Component Value Date   HGBA1C 4.4 04/20/2021   HGBA1C 4.8 05/01/2019   HGBA1C 4.8 04/30/2019   No results found for: "INSULIN" Lab Results  Component Value Date   TSH 2.14 04/17/2019   Lab Results  Component Value Date   CHOL 164 04/20/2021   HDL 79 04/20/2021   LDLCALC 71 04/20/2021   TRIG 48 04/20/2021  CHOLHDL 2.1 04/20/2021   Lab Results  Component Value Date   VD25OH 26 (L) 04/20/2021   VD25OH 37 03/19/2020   VD25OH 18 (L) 04/17/2019   Lab Results  Component Value Date   WBC 2.1 (L) 06/25/2021   HGB 11.1 (L) 06/25/2021   HCT 34.5 (L) 06/25/2021   MCV 86.7 06/25/2021   PLT 240 06/25/2021   Lab Results  Component Value Date   IRON 51 04/20/2021   TIBC 380 04/20/2021   FERRITIN 20 04/20/2021   Attestation Statements:   Reviewed by clinician on day of visit: allergies, medications,  problem list, medical history, surgical history, family history, social history, and previous encounter notes.  I, Elnora Morrison, RMA am acting as transcriptionist for Coralie Common, MD.  I have reviewed the above documentation for accuracy and completeness, and I agree with the above. - Coralie Common, MD

## 2022-04-29 ENCOUNTER — Encounter (INDEPENDENT_AMBULATORY_CARE_PROVIDER_SITE_OTHER): Payer: Self-pay

## 2022-05-03 ENCOUNTER — Telehealth (INDEPENDENT_AMBULATORY_CARE_PROVIDER_SITE_OTHER): Payer: Self-pay

## 2022-05-03 NOTE — Telephone Encounter (Signed)
  [facility] received a prior authorization request from your doctor for coverage of Ozempic (semaglutide) for you. The request was denied for the following reason: . Your plan only covers this drug when it is used for certain health conditions. Covered use is for type 2 diabetes mellitus. Your plan does not cover the drug for your health condition that your doctor told us you have. We reviewed the information we had. Your request has been denied. Your doctor can send [name] any new or missing information for [name] to review. For this drug, you may have to meet other criteria. You can request the drug policy for more details. You can also request other plan documents for your review.

## 2022-05-03 NOTE — Telephone Encounter (Signed)
PA started for Ozempic '2mg'$ 

## 2022-05-05 ENCOUNTER — Encounter: Payer: Self-pay | Admitting: Family Medicine

## 2022-05-05 ENCOUNTER — Ambulatory Visit (INDEPENDENT_AMBULATORY_CARE_PROVIDER_SITE_OTHER): Payer: No Typology Code available for payment source | Admitting: Family Medicine

## 2022-05-05 VITALS — BP 100/60 | HR 64 | Temp 98.3°F | Ht 67.0 in | Wt 151.2 lb

## 2022-05-05 DIAGNOSIS — D508 Other iron deficiency anemias: Secondary | ICD-10-CM

## 2022-05-05 DIAGNOSIS — E559 Vitamin D deficiency, unspecified: Secondary | ICD-10-CM | POA: Diagnosis not present

## 2022-05-05 DIAGNOSIS — Z8639 Personal history of other endocrine, nutritional and metabolic disease: Secondary | ICD-10-CM | POA: Diagnosis not present

## 2022-05-05 DIAGNOSIS — Z0000 Encounter for general adult medical examination without abnormal findings: Secondary | ICD-10-CM

## 2022-05-05 DIAGNOSIS — F4322 Adjustment disorder with anxiety: Secondary | ICD-10-CM

## 2022-05-05 DIAGNOSIS — Z9884 Bariatric surgery status: Secondary | ICD-10-CM | POA: Diagnosis not present

## 2022-05-05 NOTE — Patient Instructions (Addendum)
Please return in 12 months for your annual complete physical; please come fasting.   I will release your lab results to you on your MyChart account with further instructions. You may see the results before I do, but when I review them I will send you a message with my report or have my assistant call you if things need to be discussed. Please reply to my message with any questions. Thank you!   If you have any questions or concerns, please don't hesitate to send me a message via MyChart or call the office at [phone]. Thank you for visiting with [name] today! It's our pleasure caring for you.   Mindfulness-Based Stress Reduction Mindfulness-based stress reduction (MBSR) is a program that helps people learn to practice mindfulness. Mindfulness is the practice of consciously paying attention to the present moment. MBSR focuses on developing self-awareness, which lets you respond to life stress without judgment or negative feelings. It can be learned and practiced through techniques such as education, breathing exercises, meditation, and yoga. MBSR includes several mindfulness techniques in one program. MBSR works best when you understand the treatment, are willing to try new things, and can commit to spending time practicing what you learn. MBSR training may include learning about: How your feelings, thoughts, and reactions affect your body. New ways to respond to things that cause negative thoughts to start (triggers). How to notice your thoughts and let go of them. Practicing awareness of everyday things that you normally do without thinking. The techniques and goals of different types of meditation. What are the benefits of MBSR? MBSR can have many benefits, which include helping you to: Develop self-awareness. This means knowing and understanding yourself. Learn skills and attitudes that help you to take part in your own health care. Learn new ways to care for yourself. Be more accepting about  how things are, and let things go. Be less judgmental and approach things with an open mind. Be patient with yourself and trust yourself more. MBSR has also been shown to: Reduce negative emotions, such as sadness, overwhelm, and worry. Improve memory and focus. Change how you sense and react to pain. Boost your body's ability to fight infections. Help you connect better with other people. Improve your sense of well-being. How to practice mindfulness To do a basic awareness exercise: Find a comfortable place to sit. Pay attention to the present moment. Notice your thoughts, feelings, and surroundings just as they are. Avoid judging yourself, your feelings, or your surroundings. Make note of any judgment that comes up and let it go. Your mind may wander, and that is okay. Make note of when your thoughts drift, and return your attention to the present moment. To do basic mindfulness meditation: Find a comfortable place to sit. This may include a stable chair or a firm floor cushion. Sit upright with your back straight. Let your arms fall next to your sides, with your hands resting on your legs. If you are sitting in a chair, rest your feet flat on the floor. If you are sitting on a cushion, cross your legs in front of you. Keep your head in a neutral position with your chin dropped slightly. Relax your jaw and rest the tip of your tongue on the roof of your mouth. Drop your gaze to the floor or close your eyes. Breathe normally and pay attention to your breath. Feel the air moving in and out of your nose. Feel your belly expanding and relaxing with each breath. Your  mind may wander, and that is okay. Make note of when your thoughts drift, and return your attention to your breath. Avoid judging yourself, your feelings, or your surroundings. Make note of any judgment or feelings that come up, let them go, and bring your attention back to your breath. When you are ready, lift your gaze or open  your eyes. Pay attention to how your body feels after the meditation. Follow these instructions at home:  Find a local in-person or online MBSR program. Set aside some time regularly for mindfulness practice. Practice every day if you can. Even 10 minutes of practice is helpful. Find a mindfulness practice that works best for you. This may include one or more of the following: Meditation. This involves focusing your mind on a certain thought or activity. Breathing awareness exercises. These help you to stay present by focusing on your breath. Body scan. For this practice, you lie down and pay attention to each part of your body from head to toe. You can identify tension and soreness and consciously relax parts of your body. Yoga. Yoga involves stretching and breathing, and it can improve your ability to move and be flexible. It can also help you to test your body's limits, which can help you release stress. Mindful eating. This way of eating involves focusing on the taste, texture, color, and smell of each bite of food. This slows down eating and helps you feel full sooner. For this reason, it can be an important part of a weight loss plan. Find a podcast or recording that provides guidance for breathing awareness, body scan, or meditation exercises. You can listen to these any time when you have a free moment to rest without distractions. Follow your treatment plan as told by your health care provider. This may include taking regular medicines and making changes to your diet or lifestyle as recommended. Where to find more information You can find more information about MBSR from: Your health care provider. Community-based meditation centers or programs. Programs offered near you. Summary Mindfulness-based stress reduction (MBSR) is a program that teaches you how to consciously pay attention to the present moment. It is used to help you deal better with daily stress, feelings, and pain. MBSR  focuses on developing self-awareness, which allows you to respond to life stress without judgment or negative feelings. MBSR programs may involve learning different mindfulness practices, such as breathing exercises, meditation, yoga, body scan, or mindful eating. Find a mindfulness practice that works best for you, and set aside time for it on a regular basis. This information is not intended to replace advice given to you by your health care provider. Make sure you discuss any questions you have with your health care provider. Document Revised: 10/02/2020 Document Reviewed: 10/02/2020 Elsevier Patient Education  [place].

## 2022-05-05 NOTE — Progress Notes (Signed)
Subjective  Chief Complaint  Patient presents with   Annual Exam    Pt here for annual exam and is currently fasting     HPI: Leah Gilmore is a 37 y.o. female who presents to [facility] [place] today for a Female Wellness Visit.  She also has the concerns and/or needs as listed above in the chief complaint. These will be addressed in addition to the Health Maintenance Visit.   Wellness Visit: annual visit with health maintenance review and exam without Pap reviewed office visits from heme and weight loss. Reviewed labs from April 2023 and July 2023.  Reviewed bone marrow bx results from 2023 HM: Screens are current.  Healthy lifestyle.  Married, mother of 4.  Very busy.  Chronically tired, sleeps 6 to 7 hours per night which is not her ideal.  Sleeps well.  Declines flu shot. Chronic disease management visit and/or acute problem visit: Iron deficiency anemia: Had complete workup with hematology and again last year.  Did not need iron infusions last year.  Bone marrow stores are stable.  Did have bone marrow biopsy for low white blood count.  Fortunately this was normal.  Has chronic mild anemia.  History of menorrhagia status post ablation therapy which has worked Obesity: Still with healthy weight and wellness on Ozempic but unfortunately, insurance has denied ongoing treatment.  She is fearful about coming off of the GLP-1 but will continue to work on diet and exercise.  Her weight is ideal at the moment.  She feels well. History of gastric bypass surgery and vitamin D deficiency on high-dose supplements weekly now.  Due for recheck.  Thinks low D does contribute to her fatigue History of adjustment disorder with anxiety: Now seeing a psychotherapist.  This is going fairly well.  Did not tolerate sertraline.  Does not feel she needs medication assistance at this time.  Does tend to run high strung most of the time.  No panic or depressive symptoms  Assessment  1.  Annual physical exam   2. History of obesity   3. S/P gastric bypass   4. Other iron deficiency anemia   5. Vitamin D deficiency   6. Adjustment disorder with anxiety      Plan  Female Wellness Visit: Age appropriate Health Maintenance and Prevention measures were discussed with patient. Included topics are cancer screening recommendations, ways to keep healthy (see AVS) including dietary and exercise recommendations, regular eye and dental care, use of seat belts, and avoidance of moderate alcohol use and tobacco use.  BMI: discussed patient's BMI and encouraged positive lifestyle modifications to help get to or maintain a target BMI. HM needs and immunizations were addressed and ordered. See below for orders. See HM and immunization section for updates.  Declines flu vaccination after education Routine labs and screening tests ordered including cmp, cbc and lipids where appropriate. Discussed recommendations regarding Vit D and calcium supplementation (see AVS)  Chronic disease f/u and/or acute problem visit: (deemed necessary to be done in addition to the wellness visit): History of obesity: Reassured, continue diet exercise Check vitamin D levels Monitor anemia and iron levels Discussed anxiety management and stress management.  Recommend meditation Fatigue: Likely chronically sleep deprived.  Busy lifestyle with full-time work, married and 4 children.  Chart adjust schedule as she can.  Follow up: 12 months for recheck CPE  No orders of the defined types were placed in this encounter.  No orders of the defined types were placed  in this encounter.      Body mass index is 23.68 kg/m. Wt Readings from Last 3 Encounters:  05/05/22 151 lb 3.2 oz (68.6 kg)  04/08/22 151 lb (68.5 kg)  03/11/22 146 lb (66.2 kg)      Patient Active Problem List   Diagnosis Date Noted   Vitamin D deficiency 04/20/2021   History of obesity 04/20/2021   Adjustment disorder with anxiety  04/20/2021   Iron deficiency anemia 03/11/2017   PCOS (polycystic ovarian syndrome) 03/29/2016   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   Health Maintenance  Topic Date Due   INFLUENZA VACCINE  05/21/2022 (Originally 10/06/2021)   COVID-19 Vaccine (4 - 2023-24 season) 05/21/2022 (Originally 11/06/2021)   DTaP/Tdap/Td (2 - Td or Tdap) 10/11/2023   PAP SMEAR-Modifier  04/17/2025   Hepatitis C Screening  Completed   HIV Screening  Completed   HPV VACCINES  Aged Out   Immunization History  Administered Date(s) Administered   Influenza, Seasonal, Injecte, Preservative Fre 11/20/2014   Influenza,inj,Quad PF,6+ Mos 10/31/2016, 12/16/2017   Influenza-Unspecified 11/29/2016, 12/14/2019   PFIZER(Purple Top)SARS-COV-2 Vaccination 05/11/2019, 06/01/2019, 12/14/2019   Tdap 10/10/2013   We updated and reviewed the patient's past history in detail and it is documented below. Allergies: Patient  reports no history of alcohol use. Past Medical History Patient  has a past medical history of Anemia, COVID-19 (11/03/2020), History of obesity (04/20/2021), Iron deficiency anemia (03/11/2017), Multiple food allergies, PCOS (polycystic ovarian syndrome) (03/29/2016), S/P gastric bypass (03/29/2016), and Vitamin D deficiency. 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 Social History   Socioeconomic History   Marital status: Married    Spouse name: Glenford Peers   Number of children: 2   Years of education: Not on file   Highest education level: Not on file  Occupational History   Occupation: Counselling psychologist, housing loans    Employer: [employer], [employer]  Tobacco Use   Smoking status: Never   Smokeless tobacco: Never  Vaping Use   Vaping Use: Never used  Substance and Sexual Activity   Alcohol use: No    Alcohol/week: 0.0 standard  drinks of alcohol    Comment: rarely   Drug use: No   Sexual activity: Yes    Birth control/protection: None    Comment: same sex couple  Other Topics Concern   Not on file  Social History Narrative   Not on file   Social Determinants of Health   Financial Resource Strain: Not on file  Food Insecurity: Not on file  Transportation Needs: Not on file  Physical Activity: Not on file  Stress: Not on file  Social Connections: Not on file   Family History  Problem Relation Age of Onset   Thyroid disease Mother    Depression Mother    Anxiety disorder Mother    Obesity Mother    Cancer Father 74       lung cancer, smoker   Alcohol abuse Father    Drug abuse Father    Liver disease Father    Diabetes Maternal Grandmother    Cancer Maternal Grandfather        prostate   Diabetes Paternal Grandmother    Hypertension Paternal Grandmother     Review of Systems: Constitutional: negative for fever or malaise Ophthalmic: negative for photophobia, double vision or loss of vision Cardiovascular: negative for chest pain, dyspnea on exertion, or  new LE swelling Respiratory: negative for SOB or persistent cough Gastrointestinal: negative for abdominal pain, change in bowel habits or melena Genitourinary: negative for dysuria or gross hematuria, no abnormal uterine bleeding or disharge Musculoskeletal: negative for new gait disturbance or muscular weakness Integumentary: negative for new or persistent rashes, no breast lumps Neurological: negative for TIA or stroke symptoms Psychiatric: negative for SI or delusions Allergic/Immunologic: negative for hives  Patient Care Team    Relationship Specialty Notifications Start End  Leamon Arnt, MD PCP - General Family Medicine  03/23/16   Nicholas Lose, MD Consulting Physician Hematology and Oncology  03/20/18   Dillingham, [name], DO Attending Physician Plastic Surgery  04/17/19   Janyth Contes, MD Consulting Physician  Obstetrics and Gynecology  04/20/21     Objective  Vitals: BP 100/60   Pulse 64   Temp 98.3 F (36.8 C)   Ht '5\' 7"'$  (1.702 m)   Wt 151 lb 3.2 oz (68.6 kg)   SpO2 98%   BMI 23.68 kg/m  General:  Well developed, well nourished, no acute distress  Psych:  Alert and orientedx3,normal mood and affect HEENT:  Normocephalic, atraumatic, non-icteric sclera, PERRL, supple neck without adenopathy, mass or thyromegaly Cardiovascular:  Normal S1, S2, RRR without gallop, rub or murmur Respiratory:  Good breath sounds bilaterally, CTAB with normal respiratory effort Gastrointestinal: normal bowel sounds, soft, non-tender, no noted masses. No HSM MSK: no deformities, contusions. Joints are without erythema or swelling.  Skin:  Warm, no rashes or suspicious lesions noted Neurologic:    Mental status is normal. Gross motor and sensory exams are normal. Normal gait. No tremor    Commons side effects, risks, benefits, and alternatives for medications and treatment plan prescribed today were discussed, and the patient expressed understanding of the given instructions. Patient is instructed to call or message via MyChart if he/she has any questions or concerns regarding our treatment plan. No barriers to understanding were identified. We discussed Red Flag symptoms and signs in detail. Patient expressed understanding regarding what to do in case of urgent or emergency type symptoms.  Medication list was reconciled, printed and provided to the patient in AVS. Patient instructions and summary information was reviewed with the patient as documented in the AVS. This note was prepared with assistance of Dragon voice recognition software. Occasional wrong-word or sound-a-like substitutions may have occurred due to the inherent limitations of voice recognition software .

## 2022-05-06 ENCOUNTER — Encounter (INDEPENDENT_AMBULATORY_CARE_PROVIDER_SITE_OTHER): Payer: Self-pay | Admitting: Family Medicine

## 2022-05-06 ENCOUNTER — Ambulatory Visit (INDEPENDENT_AMBULATORY_CARE_PROVIDER_SITE_OTHER): Payer: No Typology Code available for payment source | Admitting: Family Medicine

## 2022-05-06 VITALS — BP 112/69 | HR 63 | Temp 98.5°F | Ht 67.0 in | Wt 148.0 lb

## 2022-05-06 DIAGNOSIS — D508 Other iron deficiency anemias: Secondary | ICD-10-CM | POA: Diagnosis not present

## 2022-05-06 DIAGNOSIS — Z6823 Body mass index (BMI) 23.0-23.9, adult: Secondary | ICD-10-CM | POA: Diagnosis not present

## 2022-05-06 DIAGNOSIS — E669 Obesity, unspecified: Secondary | ICD-10-CM | POA: Diagnosis not present

## 2022-05-06 DIAGNOSIS — E88819 Insulin resistance, unspecified: Secondary | ICD-10-CM | POA: Diagnosis not present

## 2022-05-06 LAB — CBC WITH DIFFERENTIAL/PLATELET
Absolute Monocytes: 183 cells/uL — ABNORMAL LOW (ref 200–950)
Basophils Absolute: 29 cells/uL (ref 0–200)
Basophils Relative: 1.4 %
Eosinophils Absolute: 40 cells/uL (ref 15–500)
Eosinophils Relative: 1.9 %
HCT: 33.5 % — ABNORMAL LOW (ref 35.0–45.0)
Hemoglobin: 10.7 g/dL — ABNORMAL LOW (ref 11.7–15.5)
Lymphs Abs: 1157 cells/uL (ref 850–3900)
MCH: 28.4 pg (ref 27.0–33.0)
MCHC: 31.9 g/dL — ABNORMAL LOW (ref 32.0–36.0)
MCV: 88.9 fL (ref 80.0–100.0)
MPV: 12.1 fL (ref 7.5–12.5)
Monocytes Relative: 8.7 %
Neutro Abs: 691 cells/uL — ABNORMAL LOW (ref 1500–7800)
Neutrophils Relative %: 32.9 %
Platelets: 209 10*3/uL (ref 140–400)
RBC: 3.77 10*6/uL — ABNORMAL LOW (ref 3.80–5.10)
RDW: 11.5 % (ref 11.0–15.0)
Total Lymphocyte: 55.1 %
WBC: 2.1 10*3/uL — ABNORMAL LOW (ref 3.8–10.8)

## 2022-05-06 LAB — COMPREHENSIVE METABOLIC PANEL
AG Ratio: 1.5 (calc) (ref 1.0–2.5)
ALT: 10 U/L (ref 6–29)
AST: 17 U/L (ref 10–30)
Albumin: 4.4 g/dL (ref 3.6–5.1)
Alkaline phosphatase (APISO): 51 U/L (ref 31–125)
BUN: 14 mg/dL (ref 7–25)
CO2: 25 mmol/L (ref 20–32)
Calcium: 9.4 mg/dL (ref 8.6–10.2)
Chloride: 110 mmol/L (ref 98–110)
Creat: 0.7 mg/dL (ref 0.50–0.97)
Globulin: 2.9 g/dL (calc) (ref 1.9–3.7)
Glucose, Bld: 84 mg/dL (ref 65–99)
Potassium: 5 mmol/L (ref 3.5–5.3)
Sodium: 143 mmol/L (ref 135–146)
Total Bilirubin: 1.6 mg/dL — ABNORMAL HIGH (ref 0.2–1.2)
Total Protein: 7.3 g/dL (ref 6.1–8.1)

## 2022-05-06 LAB — LIPID PANEL
Cholesterol: 155 mg/dL (ref <200)
HDL: 77 mg/dL (ref >=50)
LDL Cholesterol (Calc): 65 mg/dL (calc)
Non-HDL Cholesterol (Calc): 78 mg/dL (calc) (ref <130)
Total CHOL/HDL Ratio: 2 (calc) (ref <5.0)
Triglycerides: 54 mg/dL (ref <150)

## 2022-05-06 LAB — VITAMIN D 25 HYDROXY (VIT D DEFICIENCY, FRACTURES): Vit D, 25-Hydroxy: 45 ng/mL (ref 30–100)

## 2022-05-06 LAB — IRON,TIBC AND FERRITIN PANEL
%SAT: 21 % (calc) (ref 16–45)
Ferritin: 11 ng/mL — ABNORMAL LOW (ref 16–154)
Iron: 78 ug/dL (ref 40–190)
TIBC: 372 mcg/dL (calc) (ref 250–450)

## 2022-05-06 LAB — TSH: TSH: 0.98 mIU/L

## 2022-05-06 MED ORDER — TIRZEPATIDE 5 MG/0.5ML ~~LOC~~ SOAJ: 5.0000 mg | SUBCUTANEOUS | 0 refills | Status: DC

## 2022-05-06 NOTE — Progress Notes (Signed)
Chief Complaint:   OBESITY Leah Gilmore is here to discuss her progress with her obesity treatment plan along with follow-up of her obesity related diagnoses. Leah Gilmore is on the Category 2 Plan and states she is following her eating plan approximately 50% of the time. Leah Gilmore states she is not currently exercising.  Today's visit was #: 36 Starting weight: 209 lbs Starting date: 04/30/2019 Today's weight: 148 lbs Today's date: 05/06/2022 Total lbs lost to date: 61 Total lbs lost since last in-office visit: 3  Interim History: Patient has been doing therapy, work, and family life.  Work has been very stressful.with increased demands and expectations.  She recently adopted a puppy Leah Gilmore).  She is feeling very frustrated with medication coverage.  She was just cut off of Ozempic without any tapering.  Patient's son's team just qualified for national soccer so patient reports that there are many different travel games.  She is going to therapy twice a month and is working on self care.  Subjective:   1. Insulin resistance Slight elevation of insulin level. Leah Gilmore reports significant carb cravings when she is off medications.  2. Other iron deficiency anemia Pt with fatigue. She has an appt with hematology in April.  Assessment/Plan:   1. Insulin resistance Leah Gilmore will start Mounjaro 5 mg as per below and continue to work on weight loss, exercise, and decreasing simple carbohydrates to help decrease the risk of diabetes. Leah Gilmore agreed to follow-up with [name] as directed to closely monitor her progress.  Start- tirzepatide [facility]) 5 MG/0.5ML Pen; Inject 5 mg into the skin once a week.  Dispense: 2 mL; Refill: 0  2. Other iron deficiency anemia Follow up with hematology/oncology at next scheduled appt.  3. Obesity with current BMI of 23.2 Leah Gilmore is currently in the action stage of change. As such, her goal is to continue with weight loss efforts. She has agreed to the Category 2 Plan.   Exercise goals: All  adults should avoid inactivity. Some physical activity is better than none, and adults who participate in any amount of physical activity gain some health benefits.  Behavioral modification strategies: increasing lean protein intake, meal planning and cooking strategies, keeping healthy foods in the home, and planning for success.  Leah Gilmore has agreed to follow-up with our clinic in 4 weeks. She was informed of the importance of frequent follow-up visits to maximize her success with intensive lifestyle modifications for her multiple health conditions.   Objective:   Blood pressure 112/69, pulse 63, temperature 98.5 F (36.9 C), height 5\' 7"  (1.702 m), weight 148 lb (67.1 kg), SpO2 100 %. Body mass index is 23.18 kg/m.  General: Cooperative, alert, well developed, in no acute distress. HEENT: Conjunctivae and lids unremarkable. Cardiovascular: Regular rhythm.  Lungs: Normal work of breathing. Neurologic: No focal deficits.   Lab Results  Component Value Date   CREATININE 0.70 05/05/2022   BUN 14 05/05/2022   NA 143 05/05/2022   K 5.0 05/05/2022   CL 110 05/05/2022   CO2 25 05/05/2022   Lab Results  Component Value Date   ALT 10 05/05/2022   AST 17 05/05/2022   ALKPHOS 53 03/19/2020   BILITOT 1.6 (H) 05/05/2022   Lab Results  Component Value Date   HGBA1C 4.4 04/20/2021   HGBA1C 4.8 05/01/2019   HGBA1C 4.8 04/30/2019   No results found for: "INSULIN" Lab Results  Component Value Date   TSH 0.98 05/05/2022   Lab Results  Component Value Date  CHOL 155 05/05/2022   HDL 77 05/05/2022   LDLCALC 65 05/05/2022   TRIG 54 05/05/2022   CHOLHDL 2.0 05/05/2022   Lab Results  Component Value Date   VD25OH 45 05/05/2022   VD25OH 26 (L) 04/20/2021   VD25OH 37 03/19/2020   Lab Results  Component Value Date   WBC 2.1 (L) 05/05/2022   HGB 10.7 (L) 05/05/2022   HCT 33.5 (L) 05/05/2022   MCV 88.9 05/05/2022   PLT 209 05/05/2022   Lab Results  Component Value Date   IRON  78 05/05/2022   TIBC 372 05/05/2022   FERRITIN 11 (L) 05/05/2022    Attestation Statements:   Reviewed by clinician on day of visit: allergies, medications, problem list, medical history, surgical history, family history, social history, and previous encounter notes.  I, Kathlene November, BS, CMA, am acting as transcriptionist for Coralie Common, MD.   I have reviewed the above documentation for accuracy and completeness, and I agree with the above. - Coralie Common, MD

## 2022-05-12 ENCOUNTER — Encounter: Payer: Self-pay | Admitting: Family Medicine

## 2022-05-12 DIAGNOSIS — D72819 Decreased white blood cell count, unspecified: Secondary | ICD-10-CM | POA: Insufficient documentation

## 2022-06-03 ENCOUNTER — Encounter (INDEPENDENT_AMBULATORY_CARE_PROVIDER_SITE_OTHER): Payer: Self-pay | Admitting: Family Medicine

## 2022-06-03 ENCOUNTER — Ambulatory Visit (INDEPENDENT_AMBULATORY_CARE_PROVIDER_SITE_OTHER): Payer: No Typology Code available for payment source | Admitting: Family Medicine

## 2022-06-03 VITALS — BP 89/61 | HR 77 | Temp 98.7°F | Ht 67.0 in | Wt 145.0 lb

## 2022-06-03 DIAGNOSIS — F439 Reaction to severe stress, unspecified: Secondary | ICD-10-CM

## 2022-06-03 DIAGNOSIS — E88819 Insulin resistance, unspecified: Secondary | ICD-10-CM

## 2022-06-03 DIAGNOSIS — E669 Obesity, unspecified: Secondary | ICD-10-CM

## 2022-06-03 DIAGNOSIS — Z6822 Body mass index (BMI) 22.0-22.9, adult: Secondary | ICD-10-CM

## 2022-06-03 MED ORDER — TIRZEPATIDE 5 MG/0.5ML ~~LOC~~ SOAJ: 5.0000 mg | SUBCUTANEOUS | 0 refills | Status: DC

## 2022-06-03 MED ORDER — VILAZODONE HCL 10 MG PO TABS: 10.0000 mg | ORAL_TABLET | Freq: Every day | ORAL | 0 refills | Status: DC

## 2022-06-03 NOTE — Progress Notes (Signed)
Chief Complaint:   OBESITY Leah Gilmore is here to discuss her progress with her obesity treatment plan along with follow-up of her obesity related diagnoses. Leah Gilmore is on the Category 2 Plan and states she is following her eating plan approximately 80% of the time. Leah Gilmore states she is not exercising.  Today's visit was #: 68 Starting weight: 28 LBS Starting date: 04/30/2019 Today's weight: 145 LBS Today's date: 06/03/2022 Total lbs lost to date: 46 LBS Total lbs lost since last in-office visit: 3 LBS  Interim History: Patient has been traveling more frequently over the last few weeks- her son was traveling for soccer.  She has been doing well following the low carb plan.  She is getting the feeling of consistent satiety with bread or starchy foods.  Most of her meals are protein and vegetables.  Not sure if it stems from gastric bypass or staying away from those food types for long enough.  Next few weeks she has more soccer she will be bringing her kids to.  Not planning to do anything for her birthday except hiking.   Subjective:   1. Insulin resistance Patient is on Mounjaro 5 mg weekly.  Patient reports she really is not feeling much.  2. Familial stress Patient previously on sertraline which improved her mood but really affected patient's sex drive.  No suicidal or homicidal ideations.  Assessment/Plan:   1. Insulin resistance Refill- tirzepatide (MOUNJARO) 5 MG/0.5ML Pen; Inject 5 mg into the skin once a week.  Dispense: 2 mL; Refill: 0  2. Familial stress Refill- Vilazodone HCl (VIIBRYD) 10 MG TABS; Take 1 tablet (10 mg total) by mouth daily.  Dispense: 30 tablet; Refill: 0  3. Obesity with current BMI of 22.8 Leah Gilmore is currently in the action stage of change. As such, her goal is to continue with weight loss efforts. She has agreed to the Category 2 Plan and following a lower carbohydrate, vegetable and lean protein rich diet plan.   Exercise goals: All adults should avoid  inactivity. Some physical activity is better than none, and adults who participate in any amount of physical activity gain some health benefits.  Behavioral modification strategies: increasing lean protein intake, meal planning and cooking strategies, keeping healthy foods in the home, and planning for success.  Leah Gilmore has agreed to follow-up with our clinic in 4 weeks. She was informed of the importance of frequent follow-up visits to maximize her success with intensive lifestyle modifications for her multiple health conditions.   Objective:   Blood pressure (!) 89/61, pulse 77, temperature 98.7 F (37.1 C), height 5\' 7"  (1.702 m), weight 145 lb (65.8 kg), SpO2 99 %. Body mass index is 22.71 kg/m.  General: Cooperative, alert, well developed, in no acute distress. HEENT: Conjunctivae and lids unremarkable. Cardiovascular: Regular rhythm.  Lungs: Normal work of breathing. Neurologic: No focal deficits.   Lab Results  Component Value Date   CREATININE 0.70 05/05/2022   BUN 14 05/05/2022   NA 143 05/05/2022   K 5.0 05/05/2022   CL 110 05/05/2022   CO2 25 05/05/2022   Lab Results  Component Value Date   ALT 10 05/05/2022   AST 17 05/05/2022   ALKPHOS 53 03/19/2020   BILITOT 1.6 (H) 05/05/2022   Lab Results  Component Value Date   HGBA1C 4.4 04/20/2021   HGBA1C 4.8 05/01/2019   HGBA1C 4.8 04/30/2019   No results found for: "INSULIN" Lab Results  Component Value Date   TSH 0.98 05/05/2022  Lab Results  Component Value Date   CHOL 155 05/05/2022   HDL 77 05/05/2022   LDLCALC 65 05/05/2022   TRIG 54 05/05/2022   CHOLHDL 2.0 05/05/2022   Lab Results  Component Value Date   VD25OH 45 05/05/2022   VD25OH 26 (L) 04/20/2021   VD25OH 37 03/19/2020   Lab Results  Component Value Date   WBC 2.1 (L) 05/05/2022   HGB 10.7 (L) 05/05/2022   HCT 33.5 (L) 05/05/2022   MCV 88.9 05/05/2022   PLT 209 05/05/2022   Lab Results  Component Value Date   IRON 78 05/05/2022    TIBC 372 05/05/2022   FERRITIN 11 (L) 05/05/2022    Attestation Statements:   Reviewed by clinician on day of visit: allergies, medications, problem list, medical history, surgical history, family history, social history, and previous encounter notes.  I, Malcolm Metro, RMA, am acting as transcriptionist for Reuben Likes, MD.  I have reviewed the above documentation for accuracy and completeness, and I agree with the above. - Reuben Likes, MD

## 2022-06-24 ENCOUNTER — Encounter (INDEPENDENT_AMBULATORY_CARE_PROVIDER_SITE_OTHER): Payer: Self-pay | Admitting: Family Medicine

## 2022-06-24 ENCOUNTER — Ambulatory Visit (INDEPENDENT_AMBULATORY_CARE_PROVIDER_SITE_OTHER): Payer: No Typology Code available for payment source | Admitting: Family Medicine

## 2022-06-24 VITALS — BP 99/64 | HR 90 | Temp 98.9°F | Ht 67.0 in | Wt 147.0 lb

## 2022-06-24 DIAGNOSIS — F439 Reaction to severe stress, unspecified: Secondary | ICD-10-CM | POA: Diagnosis not present

## 2022-06-24 DIAGNOSIS — Z6823 Body mass index (BMI) 23.0-23.9, adult: Secondary | ICD-10-CM | POA: Diagnosis not present

## 2022-06-24 DIAGNOSIS — E559 Vitamin D deficiency, unspecified: Secondary | ICD-10-CM | POA: Diagnosis not present

## 2022-06-24 DIAGNOSIS — E669 Obesity, unspecified: Secondary | ICD-10-CM | POA: Diagnosis not present

## 2022-06-24 MED ORDER — VITAMIN D (ERGOCALCIFEROL) 1.25 MG (50000 UNIT) PO CAPS: ORAL_CAPSULE | ORAL | 0 refills | Status: DC

## 2022-06-24 NOTE — Progress Notes (Signed)
Chief Complaint:   OBESITY Leah Gilmore is here to discuss her progress with her obesity treatment plan along with follow-up of her obesity related diagnoses. Leah Gilmore is on the Category 2 Plan and following a lower carbohydrate, vegetable and lean protein rich diet plan and states she is following her eating plan approximately 85-90% of the time. Leah Gilmore states she is not exercising.  Today's visit was #: 42 Starting weight: 209 lbs Starting date: 04/30/2019 Today's weight: 147 LBS Today's date: 06/24/2022 Total lbs lost to date: 62 LBS Total lbs lost since last in-office visit: +2 LBS  Interim History: Patient here today with her daughter. She has been busy with typical mom life.  She went back to low carb because she doesn't have to really monitor much.  When she tries to eat anything with more carbs she feels nauseous and sometimes has vomiting.  Her mounjaro was denied by her insurance.  Subjective:   1. Vitamin D deficiency Patient last vitamin D level was 45.  Patient denies nausea, vomiting, muscle weakness, but is positive for fatigue.  2. Familial stress Patient's Viibryd is on hold at [facility].  Patient denies suicidal or homicidal ideations.  Assessment/Plan:   1. Vitamin D deficiency Refill- Vitamin D, Ergocalciferol, (DRISDOL) 1.25 MG (50000 UNIT) CAPS capsule; TAKE 1 CAPSULE (50,000 UNITS TOTAL) BY MOUTH ONCE A WEEK.  Dispense: 4 capsule; Refill: 0  2. Familial stress Follow-up on whether patient started medication and next appointment.  3. Obesity with current BMI of 23.1 Leah Gilmore is currently in the action stage of change. As such, her goal is to continue with weight loss efforts. She has agreed to following a lower carbohydrate, vegetable and lean protein rich diet plan.   Exercise goals: No exercise has been prescribed at this time.  Behavioral modification strategies: increasing lean protein intake, meal planning and cooking strategies, keeping healthy foods in the  home, and planning for success.  Leah Gilmore has agreed to follow-up with our clinic in 4 weeks. She was informed of the importance of frequent follow-up visits to maximize her success with intensive lifestyle modifications for her multiple health conditions.   Objective:   Blood pressure 99/64, pulse 90, temperature 98.9 F (37.2 C), height  (1.702 m), weight 147 lb (66.7 kg), SpO2 90 %. Body mass index is 23.02 kg/m.  General: Cooperative, alert, well developed, in no acute distress. HEENT: Conjunctivae and lids unremarkable. Cardiovascular: Regular rhythm.  Lungs: Normal work of breathing. Neurologic: No focal deficits.   Lab Results  Component Value Date   CREATININE 0.70 05/05/2022   BUN 14 05/05/2022   NA 143 05/05/2022   K 5.0 05/05/2022   CL 110 05/05/2022   CO2 25 05/05/2022   Lab Results  Component Value Date   ALT 10 05/05/2022   AST 17 05/05/2022   ALKPHOS 53 03/19/2020   BILITOT 1.6 (H) 05/05/2022   Lab Results  Component Value Date   HGBA1C 4.4 04/20/2021   HGBA1C 4.8 05/01/2019   HGBA1C 4.8 04/30/2019   No results found for: "INSULIN" Lab Results  Component Value Date   TSH 0.98 05/05/2022   Lab Results  Component Value Date   CHOL 155 05/05/2022   HDL 77 05/05/2022   LDLCALC 65 05/05/2022   TRIG 54 05/05/2022   CHOLHDL 2.0 05/05/2022   Lab Results  Component Value Date   VD25OH 45 05/05/2022   VD25OH 26 (L) 04/20/2021   VD25OH 37 03/19/2020   Lab Results  Component Value Date   WBC 2.1 (L) 05/05/2022   HGB 10.7 (L) 05/05/2022   HCT 33.5 (L) 05/05/2022   MCV 88.9 05/05/2022   PLT 209 05/05/2022   Lab Results  Component Value Date   IRON 78 05/05/2022   TIBC 372 05/05/2022   FERRITIN 11 (L) 05/05/2022   Attestation Statements:   Reviewed by clinician on day of visit: allergies, medications, problem list, medical history, surgical history, family history, social history, and previous encounter notes.  I, Malcolm Metro, RMA, am  acting as transcriptionist for Reuben Likes, MD.  I have reviewed the above documentation for accuracy and completeness, and I agree with the above. - Reuben Likes, MD

## 2022-07-06 ENCOUNTER — Encounter (INDEPENDENT_AMBULATORY_CARE_PROVIDER_SITE_OTHER): Payer: Self-pay | Admitting: Family Medicine

## 2022-07-21 ENCOUNTER — Encounter (INDEPENDENT_AMBULATORY_CARE_PROVIDER_SITE_OTHER): Payer: Self-pay | Admitting: Family Medicine

## 2022-07-21 NOTE — Telephone Encounter (Signed)
Please reschedule appt, Thanks

## 2022-07-22 ENCOUNTER — Ambulatory Visit (INDEPENDENT_AMBULATORY_CARE_PROVIDER_SITE_OTHER): Payer: No Typology Code available for payment source | Admitting: Family Medicine

## 2022-08-05 ENCOUNTER — Ambulatory Visit (INDEPENDENT_AMBULATORY_CARE_PROVIDER_SITE_OTHER): Payer: No Typology Code available for payment source | Admitting: Family Medicine

## 2022-08-05 ENCOUNTER — Encounter (INDEPENDENT_AMBULATORY_CARE_PROVIDER_SITE_OTHER): Payer: Self-pay | Admitting: Family Medicine

## 2022-08-05 VITALS — BP 101/57 | HR 61 | Temp 98.5°F | Ht 67.0 in | Wt 153.0 lb

## 2022-08-05 DIAGNOSIS — Z6823 Body mass index (BMI) 23.0-23.9, adult: Secondary | ICD-10-CM

## 2022-08-05 DIAGNOSIS — D508 Other iron deficiency anemias: Secondary | ICD-10-CM | POA: Diagnosis not present

## 2022-08-05 DIAGNOSIS — E559 Vitamin D deficiency, unspecified: Secondary | ICD-10-CM

## 2022-08-05 DIAGNOSIS — Z6824 Body mass index (BMI) 24.0-24.9, adult: Secondary | ICD-10-CM

## 2022-08-05 DIAGNOSIS — E669 Obesity, unspecified: Secondary | ICD-10-CM | POA: Diagnosis not present

## 2022-08-05 NOTE — Progress Notes (Signed)
Chief Complaint:   OBESITY Leah Gilmore is here to discuss her progress with her obesity treatment plan along with follow-up of her obesity related diagnoses. Leah Gilmore is on following a lower carbohydrate, vegetable and lean protein rich diet plan and states she is following her eating plan approximately 85% of the time. Leah Gilmore states she is not exercising.  Today's visit was #: 43 Starting weight: 209 LBS Starting date: 04/30/2019 Today's weight: 153 LBS Today's date: 08/05/2022 Total lbs lost to date: 56 LBS Total lbs lost since last in-office visit: +6 LBS  Interim History: Patient is now trying to incorporate more of the meal plan with her wife who is being seen in [place].  Just trying to get thru until end of school next week and then kids get to start camp.  She has restarted Category 2 and then gets somewhat bored.  First week she after her medication ran out she felt very frustrated with desire to eat.    Subjective:   1. Vitamin D deficiency Patient last vitamin D level was 45.  Patient denies nausea, vomiting, muscle weakness but is positive for fatigue.  2. Other iron deficiency anemia CBC similar to previously.  Decreased WBCs, decreased RBC, decreased H&H, decreased ferritin.  Assessment/Plan:   1. Vitamin D deficiency Refill vitamin D 50,000 IU weekly #4, 0 refills.  2. Other iron deficiency anemia Repeat labs in July.  3. BMI 24.0-24.9, adult  4. Obesity with starting BMI of 32.7 Leah Gilmore is currently in the action stage of change. As such, her goal is to continue with weight loss efforts. She has agreed to the Category 2 Plan.   Exercise goals: All adults should avoid inactivity. Some physical activity is better than none, and adults who participate in any amount of physical activity gain some health benefits.  Patient is to start looking for options that fit her schedule, finances.  Resistance training strongly encouraged.  Behavioral modification strategies: increasing  lean protein intake, meal planning and cooking strategies, keeping healthy foods in the home, and planning for success.  Leah Gilmore has agreed to follow-up with our clinic in 4 weeks. She was informed of the importance of frequent follow-up visits to maximize her success with intensive lifestyle modifications for her multiple health conditions.   Objective:   Blood pressure (!) 101/57, pulse 61, temperature 98.5 F (36.9 C), height 5\' 7"  (1.702 m), weight 153 lb (69.4 kg), SpO2 100 %. Body mass index is 23.96 kg/m.  General: Cooperative, alert, well developed, in no acute distress. HEENT: Conjunctivae and lids unremarkable. Cardiovascular: Regular rhythm.  Lungs: Normal work of breathing. Neurologic: No focal deficits.   Lab Results  Component Value Date   CREATININE 0.70 05/05/2022   BUN 14 05/05/2022   NA 143 05/05/2022   K 5.0 05/05/2022   CL 110 05/05/2022   CO2 25 05/05/2022   Lab Results  Component Value Date   ALT 10 05/05/2022   AST 17 05/05/2022   ALKPHOS 53 03/19/2020   BILITOT 1.6 (H) 05/05/2022   Lab Results  Component Value Date   HGBA1C 4.4 04/20/2021   HGBA1C 4.8 05/01/2019   HGBA1C 4.8 04/30/2019   No results found for: "INSULIN" Lab Results  Component Value Date   TSH 0.98 05/05/2022   Lab Results  Component Value Date   CHOL 155 05/05/2022   HDL 77 05/05/2022   LDLCALC 65 05/05/2022   TRIG 54 05/05/2022   CHOLHDL 2.0 05/05/2022   Lab Results  Component  Value Date   VD25OH 45 05/05/2022   VD25OH 26 (L) 04/20/2021   VD25OH 37 03/19/2020   Lab Results  Component Value Date   WBC 2.1 (L) 05/05/2022   HGB 10.7 (L) 05/05/2022   HCT 33.5 (L) 05/05/2022   MCV 88.9 05/05/2022   PLT 209 05/05/2022   Lab Results  Component Value Date   IRON 78 05/05/2022   TIBC 372 05/05/2022   FERRITIN 11 (L) 05/05/2022   Attestation Statements:   Reviewed by clinician on day of visit: allergies, medications, problem list, medical history, surgical history,  family history, social history, and previous encounter notes.  I, Malcolm Metro, RMA, am acting as transcriptionist for Reuben Likes, MD.  I have reviewed the above documentation for accuracy and completeness, and I agree with the above. - Reuben Likes, MD

## 2022-08-17 MED ORDER — VITAMIN D (ERGOCALCIFEROL) 1.25 MG (50000 UNIT) PO CAPS: ORAL_CAPSULE | ORAL | 0 refills | Status: DC

## 2022-08-25 ENCOUNTER — Ambulatory Visit (INDEPENDENT_AMBULATORY_CARE_PROVIDER_SITE_OTHER): Payer: No Typology Code available for payment source | Admitting: Family Medicine

## 2022-08-25 ENCOUNTER — Encounter (INDEPENDENT_AMBULATORY_CARE_PROVIDER_SITE_OTHER): Payer: Self-pay | Admitting: Family Medicine

## 2022-08-25 VITALS — BP 96/60 | HR 60 | Temp 98.3°F | Ht 67.0 in | Wt 154.0 lb

## 2022-08-25 DIAGNOSIS — Z6824 Body mass index (BMI) 24.0-24.9, adult: Secondary | ICD-10-CM

## 2022-08-25 DIAGNOSIS — F5089 Other specified eating disorder: Secondary | ICD-10-CM

## 2022-08-25 DIAGNOSIS — E559 Vitamin D deficiency, unspecified: Secondary | ICD-10-CM

## 2022-08-25 DIAGNOSIS — E669 Obesity, unspecified: Secondary | ICD-10-CM | POA: Diagnosis not present

## 2022-08-25 MED ORDER — TOPIRAMATE 25 MG PO TABS
ORAL_TABLET | ORAL | 0 refills | Status: DC
Start: 2022-08-25 — End: 2022-09-15

## 2022-08-25 MED ORDER — VITAMIN D (ERGOCALCIFEROL) 1.25 MG (50000 UNIT) PO CAPS
ORAL_CAPSULE | ORAL | 0 refills | Status: DC
Start: 2022-08-25 — End: 2022-12-20

## 2022-08-25 MED ORDER — LOMAIRA 8 MG PO TABS
ORAL_TABLET | ORAL | 0 refills | Status: DC
Start: 2022-08-25 — End: 2022-09-15

## 2022-08-25 NOTE — Progress Notes (Unsigned)
Chief Complaint:   OBESITY Leah Gilmore is here to discuss her progress with her obesity treatment plan along with follow-up of her obesity related diagnoses. Leah Gilmore is on the Category 2 Plan and states she is following her eating plan approximately 60% of the time. Leah Gilmore states she is working out for 10 to 15 minutes 6 days/week.  Today's visit was #: 44 Starting weight: 209 lb Starting date: 04/30/2019 Today's weight: 154 lb Today's date: 08/25/2022 Total lbs lost to date: 55 lb Total lbs lost since last in-office visit: +1 lb  Interim History: Patient has been more busy since last appointment.  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  She has been very busy organizing and inputting data.  She has been in the office everyday for the last month.  Not necessarily eating indulgent  food but not as on plan as she was previously.  Patient is interested in possibly exploring other medications for weight loss.    Subjective:   1. Vitamin D deficiency Vitamin D at goal of 69.  Patient denies nausea, vomiting, muscle weakness but is positive for fatigue.  2. Other disorder of eating Patient signed a controlled substance contract, no concerns seen on PDMP.  Patient lost significantly on Wegovy so goal will not be 5%.  Assessment/Plan:   1. Vitamin D deficiency Refill- Vitamin D, Ergocalciferol, (DRISDOL) 1.25 MG (50000 UNIT) CAPS capsule; TAKE 1 CAPSULE (50,000 UNITS TOTAL) BY MOUTH ONCE A WEEK.  Dispense: 4 capsule; Refill: 0  2. Other disorder of eating Start- Phentermine HCl (LOMAIRA) 8 MG TABS; Take 0.5 tablets (4 mg total) by mouth daily for 14 days, THEN 1 tablet (8 mg total) daily for 14 days.  Dispense: 23 tablet; Refill: 0  Start- topiramate (TOPAMAX) 25 MG tablet; Take 1 tablet (25 mg total) by mouth daily for 14 days, THEN 2 tablets (50 mg total) daily for 14 days.  Dispense: 45 tablet; Refill: 0  3. BMI 24.0-24.9, adult  4. Obesity with starting BMI of  32.7 Leah Gilmore is currently in the action stage of change. As such, her goal is to continue with weight loss efforts. She has agreed to the Category 2 Plan.   Exercise goals: All adults should avoid inactivity. Some physical activity is better than none, and adults who participate in any amount of physical activity gain some health benefits.  Behavioral modification strategies: increasing lean protein intake, meal planning and cooking strategies, keeping healthy foods in the home, and planning for success.  Leah Gilmore has agreed to follow-up with our clinic in 4 weeks. She was informed of the importance of frequent follow-up visits to maximize her success with intensive lifestyle modifications for her multiple health conditions.   Objective:   Blood pressure 96/60, pulse 60, temperature 98.3 F (36.8 C), height 5\' 7"  (1.702 m), weight 154 lb (69.9 kg), SpO2 100 %. Body mass index is 24.12 kg/m.  General: Cooperative, alert, well developed, in no acute distress. HEENT: Conjunctivae and lids unremarkable. Cardiovascular: Regular rhythm.  Lungs: Normal work of breathing. Neurologic: No focal deficits.   Lab Results  Component Value Date   CREATININE 0.70 05/05/2022   BUN 14 05/05/2022   NA 143 05/05/2022   K 5.0 05/05/2022   CL 110 05/05/2022   CO2 25 05/05/2022   Lab Results  Component Value Date   ALT 10 05/05/2022   AST 17 05/05/2022   ALKPHOS 53 03/19/2020   BILITOT 1.6 (H) 05/05/2022  Lab Results  Component Value Date   HGBA1C 4.4 04/20/2021   HGBA1C 4.8 05/01/2019   HGBA1C 4.8 04/30/2019   No results found for: "INSULIN" Lab Results  Component Value Date   TSH 0.98 05/05/2022   Lab Results  Component Value Date   CHOL 155 05/05/2022   HDL 77 05/05/2022   LDLCALC 65 05/05/2022   TRIG 54 05/05/2022   CHOLHDL 2.0 05/05/2022   Lab Results  Component Value Date   VD25OH 45 05/05/2022   VD25OH 26 (L) 04/20/2021   VD25OH 37 03/19/2020   Lab Results  Component Value  Date   WBC 2.1 (L) 05/05/2022   HGB 10.7 (L) 05/05/2022   HCT 33.5 (L) 05/05/2022   MCV 88.9 05/05/2022   PLT 209 05/05/2022   Lab Results  Component Value Date   IRON 78 05/05/2022   TIBC 372 05/05/2022   FERRITIN 11 (L) 05/05/2022   Attestation Statements:   Reviewed by clinician on day of visit: allergies, medications, problem list, medical history, surgical history, family history, social history, and previous encounter notes.  I, Malcolm Metro, RMA, am acting as transcriptionist for Reuben Likes, MD. I have reviewed the above documentation for accuracy and completeness, and I agree with the above. - Reuben Likes, MD

## 2022-08-26 ENCOUNTER — Encounter (INDEPENDENT_AMBULATORY_CARE_PROVIDER_SITE_OTHER): Payer: Self-pay | Admitting: Family Medicine

## 2022-08-26 NOTE — Telephone Encounter (Signed)
PA sent via cover my meds for Lomaira. Waiting for response:    Patient needs to try and fail 3 of the medications listed.  Alternatives include:  Orlistat, QSYMIA, SAXENDA, WEGOVY  According to patient records, patient has tried [name], 11/01/2019 and [facility] 04/10/2020.

## 2022-08-26 NOTE — Telephone Encounter (Signed)
Please check PA

## 2022-08-26 NOTE — Telephone Encounter (Signed)
  Your plan only covers this drug when you meet one of these options: A) You have tried other drugs your plan covers (preferred drugs), and they did not work well for you, or B) Your doctor gives [name] a medical reason you cannot take those other drugs. For your plan, you may need to try up to three preferred drugs. We have denied your request because you do not meet any of these conditions. We reviewed the information we had. Your request has been denied. Your doctor can send [name] any new or missing information for [name] to review. The preferred drugs for your plan are: orlistat, QSYMIA, SAXENDA, WEGOVY. (Requirement: 3 in a class with 3 or more alternatives, 2 in a class with 2 alternatives, or 1 in a class with only 1 alternative.). Your doctor may need to get approval from your plan for preferred drugs. For this drug, you may have to meet other criteria. You can request the drug policy for more details. You can also request other plan documents for your review.

## 2022-08-30 ENCOUNTER — Encounter (INDEPENDENT_AMBULATORY_CARE_PROVIDER_SITE_OTHER): Payer: Self-pay

## 2022-09-15 ENCOUNTER — Telehealth (INDEPENDENT_AMBULATORY_CARE_PROVIDER_SITE_OTHER): Payer: No Typology Code available for payment source | Admitting: Family Medicine

## 2022-09-15 ENCOUNTER — Encounter (INDEPENDENT_AMBULATORY_CARE_PROVIDER_SITE_OTHER): Payer: Self-pay | Admitting: Family Medicine

## 2022-09-15 VITALS — BP 90/59 | HR 65 | Temp 98.4°F | Ht 67.0 in | Wt 158.0 lb

## 2022-09-15 DIAGNOSIS — F419 Anxiety disorder, unspecified: Secondary | ICD-10-CM | POA: Diagnosis not present

## 2022-09-15 DIAGNOSIS — E669 Obesity, unspecified: Secondary | ICD-10-CM | POA: Diagnosis not present

## 2022-09-15 DIAGNOSIS — R632 Polyphagia: Secondary | ICD-10-CM

## 2022-09-15 DIAGNOSIS — Z6824 Body mass index (BMI) 24.0-24.9, adult: Secondary | ICD-10-CM

## 2022-09-15 DIAGNOSIS — F5089 Other specified eating disorder: Secondary | ICD-10-CM

## 2022-09-15 DIAGNOSIS — F32A Depression, unspecified: Secondary | ICD-10-CM | POA: Diagnosis not present

## 2022-09-15 MED ORDER — LOMAIRA 8 MG PO TABS
12.0000 mg | ORAL_TABLET | Freq: Every day | ORAL | 0 refills | Status: DC
Start: 2022-09-15 — End: 2022-10-12

## 2022-09-15 MED ORDER — TOPIRAMATE 25 MG PO TABS
75.0000 mg | ORAL_TABLET | Freq: Every day | ORAL | 0 refills | Status: DC
Start: 2022-09-15 — End: 2022-10-12

## 2022-09-15 NOTE — Progress Notes (Signed)
TeleHealth Visit:  Due to the COVID-19 pandemic, this visit was completed with telemedicine (audio/video) technology to reduce patient and provider exposure as well as to preserve personal protective equipment.   Leah Gilmore has verbally consented to this TeleHealth visit. The patient is located at [facility], the provider is located at home. The participants in this visit include the listed provider and patient. The visit was conducted today via MyChart video.   Chief Complaint: OBESITY Leah Gilmore is here to discuss her progress with her obesity treatment plan along with follow-up of her obesity related diagnoses. Leah Gilmore is on practicing portion control and making smarter food choices, such as increasing vegetables and decreasing simple carbohydrates and states she is following her eating plan approximately 80% of the time. Leah Gilmore states she is doing 0 minutes 0 times per week.  Today's visit was #: 45 Starting weight: 209 lb Starting date: 04/30/2019 Today's weight: 158 lb Today's date: 09/15/2022 Total lbs lost to date: 51 Total lbs lost since last in-office visit: 0  Interim History: Patient is feeling frustrated today.  She still is experiencing significant sugar cravings- not often, not all day, but more frequent than it was previously.  She couldn't start her combo lomaira/ topiramate for over a week due to insurance and prior authorization issues.  She isn't feeling the medication is doing much.  She has plans to go to the beach at the end of July with her family.  Subjective:   1. Polyphagia Patient is on combination Lomaira and topiramate.  PDMP was checked with no concerns.  Patient's starting weight not implemented as obesity.  Controlled on Wegovy previously (does not need 5%).  2. Anxiety and depression Patient denies suicidal or homicidal ideations.  She is emotional today with discussing her weight and obesity diagnosis disparity.  Viibryd was prescribed but not taking  it.  Assessment/Plan:   1. Polyphagia Patient agreed to increase Lomaira to 12 mg and increase topiramate to 75 mg, and we will refill for 1 month.  - Phentermine HCl (LOMAIRA) 8 MG TABS; Take 1.5 tablets (12 mg total) by mouth daily.  Dispense: 45 tablet; Refill: 0 - topiramate (TOPAMAX) 25 MG tablet; Take 3 tablets (75 mg total) by mouth daily.  Dispense: 90 tablet; Refill: 0  2. Anxiety and depression Patient is to continue to remind herself of changes that she has made and may want to look into therapy for further dissection of the relationship she has with the scale and weight.  3. BMI 24.0-24.9, adult  4. Obesity with starting BMI of 32.7 Leah Gilmore is currently in the action stage of change. As such, her goal is to continue with weight loss efforts. She has agreed to practicing portion control and making smarter food choices, such as increasing vegetables and decreasing simple carbohydrates.   Exercise goals: All adults should avoid inactivity. Some physical activity is better than none, and adults who participate in any amount of physical activity gain some health benefits.  Behavioral modification strategies: increasing lean protein intake, meal planning and cooking strategies, keeping healthy foods in the home, and planning for success.  Leah Gilmore has agreed to follow-up with our clinic in 4 weeks. She was informed of the importance of frequent follow-up visits to maximize her success with intensive lifestyle modifications for her multiple health conditions.  Objective:   VITALS: Per patient if applicable, see vitals. GENERAL: Alert and in no acute distress. CARDIOPULMONARY: No increased WOB. Speaking in clear sentences.  PSYCH: Pleasant  and cooperative. Speech normal rate and rhythm. Affect is appropriate. Insight and judgement are appropriate. Attention is focused, linear, and appropriate.  NEURO: Oriented as arrived to appointment on time with no prompting.   Lab Results  Component  Value Date   CREATININE 0.70 05/05/2022   BUN 14 05/05/2022   NA 143 05/05/2022   K 5.0 05/05/2022   CL 110 05/05/2022   CO2 25 05/05/2022   Lab Results  Component Value Date   ALT 10 05/05/2022   AST 17 05/05/2022   ALKPHOS 53 03/19/2020   BILITOT 1.6 (H) 05/05/2022   Lab Results  Component Value Date   HGBA1C 4.4 04/20/2021   HGBA1C 4.8 05/01/2019   HGBA1C 4.8 04/30/2019   No results found for: "INSULIN" Lab Results  Component Value Date   TSH 0.98 05/05/2022   Lab Results  Component Value Date   CHOL 155 05/05/2022   HDL 77 05/05/2022   LDLCALC 65 05/05/2022   TRIG 54 05/05/2022   CHOLHDL 2.0 05/05/2022   Lab Results  Component Value Date   VD25OH 45 05/05/2022   VD25OH 26 (L) 04/20/2021   VD25OH 37 03/19/2020   Lab Results  Component Value Date   WBC 2.1 (L) 05/05/2022   HGB 10.7 (L) 05/05/2022   HCT 33.5 (L) 05/05/2022   MCV 88.9 05/05/2022   PLT 209 05/05/2022   Lab Results  Component Value Date   IRON 78 05/05/2022   TIBC 372 05/05/2022   FERRITIN 11 (L) 05/05/2022    Attestation Statements:   Reviewed by clinician on day of visit: allergies, medications, problem list, medical history, surgical history, family history, social history, and previous encounter notes.   I, Vedhika Cardarelli, am acting as transcriptionist for Reuben Likes, MD.  I have reviewed the above documentation for accuracy and completeness, and I agree with the above. - Reuben Likes, MD

## 2022-09-22 ENCOUNTER — Ambulatory Visit (INDEPENDENT_AMBULATORY_CARE_PROVIDER_SITE_OTHER): Payer: No Typology Code available for payment source | Admitting: Family Medicine

## 2022-10-12 ENCOUNTER — Encounter (INDEPENDENT_AMBULATORY_CARE_PROVIDER_SITE_OTHER): Payer: Self-pay | Admitting: Family Medicine

## 2022-10-12 ENCOUNTER — Ambulatory Visit (INDEPENDENT_AMBULATORY_CARE_PROVIDER_SITE_OTHER): Payer: No Typology Code available for payment source | Admitting: Family Medicine

## 2022-10-12 VITALS — BP 95/59 | HR 64 | Temp 98.4°F | Ht 67.0 in | Wt 158.0 lb

## 2022-10-12 DIAGNOSIS — E669 Obesity, unspecified: Secondary | ICD-10-CM | POA: Diagnosis not present

## 2022-10-12 DIAGNOSIS — R632 Polyphagia: Secondary | ICD-10-CM | POA: Diagnosis not present

## 2022-10-12 DIAGNOSIS — Z6824 Body mass index (BMI) 24.0-24.9, adult: Secondary | ICD-10-CM | POA: Diagnosis not present

## 2022-10-12 DIAGNOSIS — D508 Other iron deficiency anemias: Secondary | ICD-10-CM | POA: Diagnosis not present

## 2022-10-12 MED ORDER — LOMAIRA 8 MG PO TABS
12.0000 mg | ORAL_TABLET | Freq: Every day | ORAL | 0 refills | Status: DC
Start: 2022-10-12 — End: 2022-12-20

## 2022-10-12 MED ORDER — TOPIRAMATE 25 MG PO TABS
75.0000 mg | ORAL_TABLET | Freq: Every day | ORAL | 0 refills | Status: DC
Start: 2022-10-12 — End: 2022-12-20

## 2022-10-12 NOTE — Progress Notes (Signed)
Chief Complaint:   OBESITY Leah Gilmore is here to discuss her progress with her obesity treatment plan along with follow-up of her obesity related diagnoses. Leah Gilmore is on practicing portion control and making smarter food choices, such as increasing vegetables and decreasing simple carbohydrates and states she is following her eating plan approximately 80% of the time. Leah Gilmore states she is walking for 20-25 minutes 5 times per week.  Today's visit was #: 46 Starting weight: 209 lbs Starting date: 04/30/2019 Today's weight: 158 lbs Today's date: 10/12/2022 Total lbs lost to date: 51 Total lbs lost since last in-office visit: 0  Interim History: Patient first appointment in person since mid June. Her kids go back to school August 19th and then August 26th. She hasn't made the commitment to buy a gym membership yet and isn't sure why.   Subjective:   1. Polyphagia Patient is on combination Lomaira and topiramate. She previously lost on Wegovy but her insurance is no longer covering her medication.   2. Other iron deficiency anemia Patient was on IV iron previously as needed for anemia. Her last MCV was 88.9, but H/H low. She has a history of gastric bypass.   Assessment/Plan:   1. Polyphagia Patient will continue her medications, and we will refill Lomaira 12 mg and topiramate 75 mg for 1 month.   - Phentermine HCl (LOMAIRA) 8 MG TABS; Take 1.5 tablets (12 mg total) by mouth daily.  Dispense: 45 tablet; Refill: 0 - topiramate (TOPAMAX) 25 MG tablet; Take 3 tablets (75 mg total) by mouth daily.  Dispense: 90 tablet; Refill: 0  2. Other iron deficiency anemia Patient is to follow-up with Hematology for any further treatment.   3. BMI 24.0-24.9, adult  4. Obesity with starting BMI of 32.7 Leah Gilmore is currently in the action stage of change. As such, her goal is to continue with weight loss efforts. She has agreed to practicing portion control and making smarter food choices, such as increasing  vegetables and decreasing simple carbohydrates.   Exercise goals: All adults should avoid inactivity. Some physical activity is better than none, and adults who participate in any amount of physical activity gain some health benefits.  Behavioral modification strategies: increasing lean protein intake, meal planning and cooking strategies, keeping healthy foods in the home, and planning for success.  Leah Gilmore has agreed to follow-up with our clinic in 3 to 4 weeks. She was informed of the importance of frequent follow-up visits to maximize her success with intensive lifestyle modifications for her multiple health conditions.   Objective:   Blood pressure (!) 95/59, pulse 64, temperature 98.4 F (36.9 C), height 5\' 7"  (1.702 m), weight 158 lb (71.7 kg), SpO2 100%. Body mass index is 24.75 kg/m.  General: Cooperative, alert, well developed, in no acute distress. HEENT: Conjunctivae and lids unremarkable. Cardiovascular: Regular rhythm.  Lungs: Normal work of breathing. Neurologic: No focal deficits.   Lab Results  Component Value Date   CREATININE 0.70 05/05/2022   BUN 14 05/05/2022   NA 143 05/05/2022   K 5.0 05/05/2022   CL 110 05/05/2022   CO2 25 05/05/2022   Lab Results  Component Value Date   ALT 10 05/05/2022   AST 17 05/05/2022   ALKPHOS 53 03/19/2020   BILITOT 1.6 (H) 05/05/2022   Lab Results  Component Value Date   HGBA1C 4.4 04/20/2021   HGBA1C 4.8 05/01/2019   HGBA1C 4.8 04/30/2019   No results found for: "INSULIN" Lab Results  Component Value  Date   TSH 0.98 05/05/2022   Lab Results  Component Value Date   CHOL 155 05/05/2022   HDL 77 05/05/2022   LDLCALC 65 05/05/2022   TRIG 54 05/05/2022   CHOLHDL 2.0 05/05/2022   Lab Results  Component Value Date   VD25OH 45 05/05/2022   VD25OH 26 (L) 04/20/2021   VD25OH 37 03/19/2020   Lab Results  Component Value Date   WBC 2.1 (L) 05/05/2022   HGB 10.7 (L) 05/05/2022   HCT 33.5 (L) 05/05/2022   MCV 88.9  05/05/2022   PLT 209 05/05/2022   Lab Results  Component Value Date   IRON 78 05/05/2022   TIBC 372 05/05/2022   FERRITIN 11 (L) 05/05/2022   Attestation Statements:   Reviewed by clinician on day of visit: allergies, medications, problem list, medical history, surgical history, family history, social history, and previous encounter notes.   I, Tyena Duhe, am acting as transcriptionist for Reuben Likes, MD.  I have reviewed the above documentation for accuracy and completeness, and I agree with the above. - Reuben Likes, MD

## 2022-10-13 ENCOUNTER — Telehealth: Payer: Self-pay

## 2022-10-13 NOTE — Telephone Encounter (Signed)
PA submitted through Cover My Meds for Lomaira. Awaiting insurance determination. Key: [id number]

## 2022-10-14 NOTE — Telephone Encounter (Signed)
Per Cover My Meds: [facility] [employer] received a prior authorization request from your doctor for coverage of Lomaira (phentermine) for you. The request was denied for the following reason: Your plan only covers this drug when you meet one of these options: A) You have tried other drugs your plan covers (preferred drugs), and they did not work well for you, or B) Your doctor gives [name] a medical reason you cannot take those other drugs. For your plan, you may need to try up to three preferred drugs. We have denied your request because you do not meet any of these conditions. We reviewed the information we had. Your request has been denied. Your doctor can send [name] any new or missing information for [name] to review. The preferred drugs for your plan are: orlistat, QSYMIA, SAXENDA, WEGOVY, ZEPBOUND. (Requirement: 3 in a class with 3 or more alternatives, 2 in a class with 2 alternatives, or 1 in a class with only 1 alternative.). Your doctor may need to get approval from your plan for preferred drugs. For this drug, you may have to meet other criteria. You can request the drug policy for more details. You can also request other plan documents for your review.

## 2022-10-15 ENCOUNTER — Encounter (HOSPITAL_COMMUNITY): Payer: Self-pay | Admitting: *Deleted

## 2022-10-17 ENCOUNTER — Other Ambulatory Visit (INDEPENDENT_AMBULATORY_CARE_PROVIDER_SITE_OTHER): Payer: Self-pay | Admitting: Family Medicine

## 2022-10-17 DIAGNOSIS — R632 Polyphagia: Secondary | ICD-10-CM

## 2022-11-09 ENCOUNTER — Ambulatory Visit (INDEPENDENT_AMBULATORY_CARE_PROVIDER_SITE_OTHER): Payer: No Typology Code available for payment source | Admitting: Adult Health

## 2022-11-09 ENCOUNTER — Encounter (INDEPENDENT_AMBULATORY_CARE_PROVIDER_SITE_OTHER): Payer: Self-pay | Admitting: Adult Health

## 2022-11-09 VITALS — BP 94/59 | HR 74 | Temp 97.9°F | Ht 67.0 in | Wt 161.0 lb

## 2022-11-09 DIAGNOSIS — E559 Vitamin D deficiency, unspecified: Secondary | ICD-10-CM | POA: Diagnosis not present

## 2022-11-09 DIAGNOSIS — F5089 Other specified eating disorder: Secondary | ICD-10-CM

## 2022-11-09 DIAGNOSIS — Z6825 Body mass index (BMI) 25.0-25.9, adult: Secondary | ICD-10-CM

## 2022-11-09 DIAGNOSIS — Z6824 Body mass index (BMI) 24.0-24.9, adult: Secondary | ICD-10-CM

## 2022-11-09 DIAGNOSIS — E669 Obesity, unspecified: Secondary | ICD-10-CM | POA: Diagnosis not present

## 2022-11-09 NOTE — Progress Notes (Signed)
WEIGHT SUMMARY AND BIOMETRICS  Vitals Temp: 97.9 F (36.6 C) BP: (!) 94/59 Pulse Rate: 74 SpO2: 100 %   Anthropometric Measurements Height: 5\' 7"  (1.702 m) Weight: 161 lb (73 kg) BMI (Calculated): 25.21 Weight at Last Visit: 158lb Weight Lost Since Last Visit: 0 Weight Gained Since Last Visit: 3lb Starting Weight: 209lb Total Weight Loss (lbs): 48 lb (21.8 kg)   Body Composition  Body Fat %: 31.8 % Fat Mass (lbs): 51.2 lbs Muscle Mass (lbs): 104.2 lbs Total Body Water (lbs): 76.2 lbs Visceral Fat Rating : 5   Other Clinical Data Fasting: no Labs: no Today's Visit #: 62 Starting Date: 04/30/19    Chief Complaint:   OBESITY Leah Gilmore is here to discuss her progress with her obesity treatment plan. She is on the practicing portion control and making smarter food choices, such as increasing vegetables and decreasing simple carbohydrates and following a lower carbohydrate, vegetable and lean protein rich diet plan and states she is following her eating plan approximately 40 % of the time. She states she is exercising Gym 45 minutes 3 times per week.   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  Subjective:   1. Vitamin D deficiency  Latest Reference Range & Units 04/20/21 10:07 05/05/22 08:55  Vitamin D, 25-Hydroxy 30 - 100 ng/mL 26 (L) 45  (L): Data is abnormally low  She is on weekly  Ergocalciferol- denies N/V/Muscle Weakness  2. Other disorder of eating She was very unhappy to see the scale increase by 3 lbs today Current weight 161 lbs She prefers to <150 lbs She denies formal dx of eating d/o She reports herheaviest weight ever was 328 pounds   Reviewed how much success she has achieved!!!  Assessment/Plan:   1. Vitamin D deficiency Continue weekly Ergocalciferol- denies need for refill at this time.  2. Other disorder of eating Continue healthy eating and regular gym exercise. Gym plan provided  3. BMI 24.0-24.9, adult, Current BMI 25.21  Kelin is currently in the action stage of change. As such, her goal is to continue with weight loss efforts. She has agreed to practicing portion control and making smarter food choices, such as increasing vegetables and decreasing simple carbohydrates and following a lower carbohydrate, vegetable and lean protein rich diet plan.   Exercise goals: For substantial health benefits, adults should do at least 150 minutes (2 hours and 30 minutes) a week of moderate-intensity, or 75 minutes (1 hour and 15 minutes) a week of vigorous-intensity aerobic physical activity, or an equivalent combination of moderate- and vigorous-intensity aerobic activity. Aerobic activity should be performed in episodes of at least 10 minutes, and preferably, it should be spread throughout the week.  Behavioral modification strategies: increasing lean protein intake, decreasing simple carbohydrates, increasing vegetables, increasing water intake, no skipping meals, meal planning and cooking strategies, keeping healthy foods in the home, and planning for success.  Charise has agreed to  follow-up with our clinic in 3 weeks. She was informed of the importance of frequent follow-up visits to maximize her success with intensive lifestyle modifications for her multiple health conditions.   Objective:   Blood pressure (!) 94/59, pulse 74, temperature 97.9 F (36.6  C), height 5\' 7"  (1.702 m), weight 161 lb (73 kg), SpO2 100%. Body mass index is 25.22 kg/m.  General: Cooperative, alert, well developed, in no acute distress. HEENT: Conjunctivae and lids unremarkable. Cardiovascular: Regular rhythm.  Lungs: Normal work of breathing. Neurologic: No focal deficits.   Lab Results  Component Value Date   CREATININE 0.70 05/05/2022   BUN 14 05/05/2022   NA 143 05/05/2022   K 5.0 05/05/2022   CL 110 05/05/2022   CO2 25 05/05/2022   Lab Results  Component Value Date   ALT 10 05/05/2022   AST 17 05/05/2022   ALKPHOS 53 03/19/2020   BILITOT 1.6 (H) 05/05/2022   Lab Results  Component Value Date   HGBA1C 4.4 04/20/2021   HGBA1C 4.8 05/01/2019   HGBA1C 4.8 04/30/2019   No results found for: "INSULIN" Lab Results  Component Value Date   TSH 0.98 05/05/2022   Lab Results  Component Value Date   CHOL 155 05/05/2022   HDL 77 05/05/2022   LDLCALC 65 05/05/2022   TRIG 54 05/05/2022   CHOLHDL 2.0 05/05/2022   Lab Results  Component Value Date   VD25OH 45 05/05/2022   VD25OH 26 (L) 04/20/2021   VD25OH 37 03/19/2020   Lab Results  Component Value Date   WBC 2.1 (L) 05/05/2022   HGB 10.7 (L) 05/05/2022   HCT 33.5 (L) 05/05/2022   MCV 88.9 05/05/2022   PLT 209 05/05/2022   Lab Results  Component Value Date   IRON 78 05/05/2022   TIBC 372 05/05/2022   FERRITIN 11 (L) 05/05/2022    Attestation Statements:   Reviewed by clinician on day of visit: allergies, medications, problem list, medical history, surgical history, family history, social history, and previous encounter notes.  Time spent on visit including pre-visit chart review and post-visit care and charting was 28 minutes.   I have reviewed the above documentation for accuracy and completeness, and I agree with the above. -  Morgon Pamer d. Hervey Wedig, NP-C

## 2022-11-12 ENCOUNTER — Other Ambulatory Visit: Payer: Self-pay | Admitting: Medical Genetics

## 2022-11-12 DIAGNOSIS — Z006 Encounter for examination for normal comparison and control in clinical research program: Secondary | ICD-10-CM

## 2022-12-15 ENCOUNTER — Encounter (INDEPENDENT_AMBULATORY_CARE_PROVIDER_SITE_OTHER): Payer: Self-pay | Admitting: Adult Health

## 2022-12-20 ENCOUNTER — Encounter (INDEPENDENT_AMBULATORY_CARE_PROVIDER_SITE_OTHER): Payer: Self-pay | Admitting: Adult Health

## 2022-12-20 ENCOUNTER — Ambulatory Visit (INDEPENDENT_AMBULATORY_CARE_PROVIDER_SITE_OTHER): Payer: No Typology Code available for payment source | Admitting: Adult Health

## 2022-12-20 VITALS — BP 123/84 | HR 59 | Temp 98.1°F | Ht 67.0 in | Wt 164.0 lb

## 2022-12-20 DIAGNOSIS — Z6832 Body mass index (BMI) 32.0-32.9, adult: Secondary | ICD-10-CM | POA: Diagnosis not present

## 2022-12-20 DIAGNOSIS — E559 Vitamin D deficiency, unspecified: Secondary | ICD-10-CM | POA: Diagnosis not present

## 2022-12-20 DIAGNOSIS — R632 Polyphagia: Secondary | ICD-10-CM | POA: Diagnosis not present

## 2022-12-20 DIAGNOSIS — Z6824 Body mass index (BMI) 24.0-24.9, adult: Secondary | ICD-10-CM

## 2022-12-20 DIAGNOSIS — E669 Obesity, unspecified: Secondary | ICD-10-CM

## 2022-12-20 DIAGNOSIS — E66811 Obesity, class 1: Secondary | ICD-10-CM

## 2022-12-20 MED ORDER — BUPROPION HCL ER (SR) 150 MG PO TB12: 150.0000 mg | ORAL_TABLET | Freq: Two times a day (BID) | ORAL | 0 refills | Status: DC

## 2022-12-20 MED ORDER — VITAMIN D (ERGOCALCIFEROL) 1.25 MG (50000 UNIT) PO CAPS
ORAL_CAPSULE | ORAL | 0 refills | Status: DC
Start: 2022-12-20 — End: 2023-02-14

## 2022-12-20 MED ORDER — BUPROPION HCL ER (SR) 150 MG PO TB12: 150.0000 mg | ORAL_TABLET | Freq: Every day | ORAL | 0 refills | Status: DC

## 2022-12-20 NOTE — Progress Notes (Signed)
WEIGHT SUMMARY AND BIOMETRICS  Vitals Temp: 98.1 F (36.7 C) BP: 123/84 Pulse Rate: (!) 59 SpO2: 99 %   Anthropometric Measurements Height: 5\' 7"  (1.702 m) Weight: 164 lb (74.4 kg) BMI (Calculated): 25.68 Weight at Last Visit: 161lb Weight Lost Since Last Visit: 0 Weight Gained Since Last Visit: 3lb Starting Weight: 209lb Total Weight Loss (lbs): 45 lb (20.4 kg)   Body Composition  Body Fat %: 32.8 % Fat Mass (lbs): 54 lbs Muscle Mass (lbs): 105 lbs Total Body Water (lbs): 76.2 lbs Visceral Fat Rating : 5   Other Clinical Data Fasting: no Labs: no Today's Visit #: 48 Starting Date: 04/30/19    Chief Complaint:   OBESITY Leah Gilmore is here to discuss her progress with her obesity treatment plan. She is on the following a lower carbohydrate, vegetable and lean protein rich diet plan and states she is following her eating plan approximately 50 % of the time. She states she is exercising Treadmill and Strength Training 45 minutes 3 times per week.   Interim History:  Starting makeshift qsymia on 08/26/22 at 154lbs-replacment for Leah Gilmore therapy. Hunger/appetite-she endorses persistent polyphagia  Due to increase in depression sx's- she stopped Topamax 25mg  - 3 tabs daily She estimates to have stopped on/about 11/11/2022  She has been off Lomaira 8mg  1.5 tabs > 3 weeks PDMP reviewed- last RF of Lomaira 8mg  was 10/12/2022  She is frustrated that her insurance will not cover AOMs. Current BMI 25.8  Subjective:   1. Polyphagia Starting makeshift qsymia on 08/26/22 at 154lbs-replacment for Leah Gilmore therapy. Hunger/appetite-she endorses persistent polyphagia  Due to increase in depression sx's- she stopped Topamax 25mg  - 3 tabs daily She estimates to have stopped on/about 11/11/2022  She has been off Lomaira 8mg  1.5 tabs > 3 weeks PDMP reviewed- last RF of Lomaira 8mg  was 10/12/2022  She endorses increased appetite and reports her clothes fitter tighter  She denies hx  of seizure d/o or nephrolithiasis Discussed risks/benefits of Wellbutrin SR therapy. Per pt she has previously been on Wellbutrin XL formulation.  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 Vitamin D deficiency  Latest Reference Range & Units 03/19/20 15:50 04/20/21 10:07 05/05/22 08:55  Vitamin D, 25-Hydroxy 30 - 100 ng/mL 37 26 (L) 45  (L): Data is abnormally low  She is on weekly Ergocalciferol- denies N/V/Muscle Weakness  Assessment/Plan:   1. Polyphagia Remain off Lomaira 8mg  1.5 tabs and Topamax 25mf 3 tabs Start buPROPion (WELLBUTRIN SR) 150 MG 12 hr tablet Take 1 tablet (150 mg total) by mouth daily. Dispense: 30 tablet, Refills: 0 ordered   2. Vitamin D deficiency Refill - Vitamin D, Ergocalciferol, (DRISDOL) 1.25 MG (50000 UNIT) CAPS capsule; TAKE 1 CAPSULE (50,000 UNITS TOTAL) BY MOUTH ONCE A WEEK.  Dispense: 4 capsule; Refill: 0  3. Obesity with starting BMI of 32.7  Leah Gilmore is currently in the action stage of change. As such, her goal is to continue with weight loss efforts. She has agreed to following a lower carbohydrate, vegetable and lean protein rich diet plan.   Exercise goals: For substantial health benefits, adults should do at least 150 minutes (2 hours and 30 minutes) a week of moderate-intensity, or 75 minutes (1 hour and 15 minutes) a week of vigorous-intensity aerobic physical activity, or an equivalent combination of moderate- and vigorous-intensity aerobic activity. Aerobic activity should be performed in episodes of at least 10 minutes, and preferably, it should be spread throughout the  week.  Behavioral modification strategies: increasing lean protein intake, decreasing simple carbohydrates, increasing vegetables, no skipping meals, meal planning and cooking strategies, keeping healthy foods in the home, ways to avoid night time snacking, and planning for success.  Arneshia has agreed to follow-up with our clinic in 4 weeks. She was  informed of the importance of frequent follow-up visits to maximize her success with intensive lifestyle modifications for her multiple health conditions.   Check IC and Fasting Labs at next OV-pt aware to arrive fasting and 30 mins prior to OV  Objective:   Blood pressure 123/84, pulse (!) 59, temperature 98.1 F (36.7 C), height 5\' 7"  (1.702 m), weight 164 lb (74.4 kg), SpO2 99%. Body mass index is 25.69 kg/m.  General: Cooperative, alert, well developed, in no acute distress. HEENT: Conjunctivae and lids unremarkable. Cardiovascular: Regular rhythm.  Lungs: Normal work of breathing. Neurologic: No focal deficits.   Lab Results  Component Value Date   CREATININE 0.70 05/05/2022   BUN 14 05/05/2022   NA 143 05/05/2022   K 5.0 05/05/2022   CL 110 05/05/2022   CO2 25 05/05/2022   Lab Results  Component Value Date   ALT 10 05/05/2022   AST 17 05/05/2022   ALKPHOS 53 03/19/2020   BILITOT 1.6 (H) 05/05/2022   Lab Results  Component Value Date   HGBA1C 4.4 04/20/2021   HGBA1C 4.8 05/01/2019   HGBA1C 4.8 04/30/2019   No results found for: "INSULIN" Lab Results  Component Value Date   TSH 0.98 05/05/2022   Lab Results  Component Value Date   CHOL 155 05/05/2022   HDL 77 05/05/2022   LDLCALC 65 05/05/2022   TRIG 54 05/05/2022   CHOLHDL 2.0 05/05/2022   Lab Results  Component Value Date   VD25OH 45 05/05/2022   VD25OH 26 (L) 04/20/2021   VD25OH 37 03/19/2020   Lab Results  Component Value Date   WBC 2.1 (L) 05/05/2022   HGB 10.7 (L) 05/05/2022   HCT 33.5 (L) 05/05/2022   MCV 88.9 05/05/2022   PLT 209 05/05/2022   Lab Results  Component Value Date   IRON 78 05/05/2022   TIBC 372 05/05/2022   FERRITIN 11 (L) 05/05/2022    Attestation Statements:   Reviewed by clinician on day of visit: allergies, medications, problem list, medical history, surgical history, family history, social history, and previous encounter notes.  I have reviewed the above  documentation for accuracy and completeness, and I agree with the above. -  Carola Viramontes d. Noelly Lasseigne, NP-C

## 2023-01-04 ENCOUNTER — Encounter (INDEPENDENT_AMBULATORY_CARE_PROVIDER_SITE_OTHER): Payer: Self-pay | Admitting: Family Medicine

## 2023-01-04 ENCOUNTER — Ambulatory Visit (INDEPENDENT_AMBULATORY_CARE_PROVIDER_SITE_OTHER): Payer: No Typology Code available for payment source | Admitting: Family Medicine

## 2023-01-04 VITALS — BP 102/51 | HR 69 | Temp 97.7°F | Ht 67.0 in | Wt 164.0 lb

## 2023-01-04 DIAGNOSIS — E559 Vitamin D deficiency, unspecified: Secondary | ICD-10-CM | POA: Diagnosis not present

## 2023-01-04 DIAGNOSIS — F32A Depression, unspecified: Secondary | ICD-10-CM | POA: Diagnosis not present

## 2023-01-04 DIAGNOSIS — F419 Anxiety disorder, unspecified: Secondary | ICD-10-CM

## 2023-01-04 DIAGNOSIS — Z6832 Body mass index (BMI) 32.0-32.9, adult: Secondary | ICD-10-CM

## 2023-01-04 DIAGNOSIS — E66811 Obesity, class 1: Secondary | ICD-10-CM

## 2023-01-04 DIAGNOSIS — E669 Obesity, unspecified: Secondary | ICD-10-CM

## 2023-01-04 DIAGNOSIS — Z6825 Body mass index (BMI) 25.0-25.9, adult: Secondary | ICD-10-CM

## 2023-01-04 MED ORDER — BUPROPION HCL ER (XL) 150 MG PO TB24
150.0000 mg | ORAL_TABLET | Freq: Every day | ORAL | 0 refills | Status: DC
Start: 2023-01-04 — End: 2023-02-14

## 2023-01-04 NOTE — Progress Notes (Unsigned)
Chief Complaint:   OBESITY Leah Gilmore is here to discuss her progress with her obesity treatment plan along with follow-up of her obesity related diagnoses. Leah Gilmore is on following a lower carbohydrate, vegetable and lean protein rich diet plan and states she is following her eating plan approximately 65-70% of the time. Leah Gilmore states she is running for 15-20 minutes 3 times per week.  Today's visit was #: 49 Starting weight: 209 lbs Starting date: 04/30/2019 Today's weight: 164 lbs Today's date: 01/04/2023 Total lbs lost to date: 45 Total lbs lost since last in-office visit: 0  Interim History: Patient has been seeing [facility]y for the last few appointments.  Patient is feeling like pasta and bread is causing significant bloat and is experiencing discomfort when she eats pasta or bread.  She realizes she hasn't been eating low carb per say.  She is experiencing a higher increase in cravings in the afternoon. Cravings well controlled on Wellbutrin SR.   Subjective:   1. Vitamin D deficiency Patient is on prescription vitamin D with no recent vitamin D level.  2. Anxiety and depression Patient denies suicidal or homicidal ideations.  She is feeling good control of her cravings with Wellbutrin.  Assessment/Plan:   1. Vitamin D deficiency We will repeat labs at patient's next appointment.  2. Anxiety and depression Patient agreed to change to Wellbutrin XL version at the same dose of 150 mg daily, and we will refill for 1 month.  - buPROPion (WELLBUTRIN XL) 150 MG 24 hr tablet; Take 1 tablet (150 mg total) by mouth daily.  Dispense: 30 tablet; Refill: 0  3. BMI 25.0-25.9,adult  4. Obesity with starting BMI of 32.7 Leah Gilmore is currently in the action stage of change. As such, her goal is to continue with weight loss efforts. She has agreed to following a lower carbohydrate, vegetable and lean protein rich diet plan.   We will repeat fasting IC and labs at her next appointment.   Exercise goals:  All adults should avoid inactivity. Some physical activity is better than none, and adults who participate in any amount of physical activity gain some health benefits. Patient is to start working on resistance training 3-4 times a week.   Behavioral modification strategies: increasing lean protein intake, meal planning and cooking strategies, keeping healthy foods in the home, and planning for success.  Leah Gilmore has agreed to follow-up with our clinic in 3 weeks. She was informed of the importance of frequent follow-up visits to maximize her success with intensive lifestyle modifications for her multiple health conditions.   Objective:   Blood pressure (!) 102/51, pulse 69, temperature 97.7 F (36.5 C), height 5\' 7"  (1.702 m), weight 164 lb (74.4 kg), SpO2 100%. Body mass index is 25.69 kg/m.  General: Cooperative, alert, well developed, in no acute distress. HEENT: Conjunctivae and lids unremarkable. Cardiovascular: Regular rhythm.  Lungs: Normal work of breathing. Neurologic: No focal deficits.   Lab Results  Component Value Date   CREATININE 0.70 05/05/2022   BUN 14 05/05/2022   NA 143 05/05/2022   K 5.0 05/05/2022   CL 110 05/05/2022   CO2 25 05/05/2022   Lab Results  Component Value Date   ALT 10 05/05/2022   AST 17 05/05/2022   ALKPHOS 53 03/19/2020   BILITOT 1.6 (H) 05/05/2022   Lab Results  Component Value Date   HGBA1C 4.4 04/20/2021   HGBA1C 4.8 05/01/2019   HGBA1C 4.8 04/30/2019   No results found for: "INSULIN" Lab Results  Component Value Date   TSH 0.98 05/05/2022   Lab Results  Component Value Date   CHOL 155 05/05/2022   HDL 77 05/05/2022   LDLCALC 65 05/05/2022   TRIG 54 05/05/2022   CHOLHDL 2.0 05/05/2022   Lab Results  Component Value Date   VD25OH 45 05/05/2022   VD25OH 26 (L) 04/20/2021   VD25OH 37 03/19/2020   Lab Results  Component Value Date   WBC 2.1 (L) 05/05/2022   HGB 10.7 (L) 05/05/2022   HCT 33.5 (L) 05/05/2022   MCV 88.9  05/05/2022   PLT 209 05/05/2022   Lab Results  Component Value Date   IRON 78 05/05/2022   TIBC 372 05/05/2022   FERRITIN 11 (L) 05/05/2022   Attestation Statements:   Reviewed by clinician on day of visit: allergies, medications, problem list, medical history, surgical history, family history, social history, and previous encounter notes.   I, Caydee Boschee, am acting as transcriptionist for Reuben Likes, MD.  I have reviewed the above documentation for accuracy and completeness, and I agree with the above. - Reuben Likes, MD

## 2023-02-14 ENCOUNTER — Ambulatory Visit (INDEPENDENT_AMBULATORY_CARE_PROVIDER_SITE_OTHER): Payer: No Typology Code available for payment source | Admitting: Family Medicine

## 2023-02-14 VITALS — BP 95/60 | HR 63 | Temp 98.4°F | Ht 67.0 in | Wt 159.0 lb

## 2023-02-14 DIAGNOSIS — D509 Iron deficiency anemia, unspecified: Secondary | ICD-10-CM | POA: Diagnosis not present

## 2023-02-14 DIAGNOSIS — F32A Depression, unspecified: Secondary | ICD-10-CM | POA: Diagnosis not present

## 2023-02-14 DIAGNOSIS — Z6824 Body mass index (BMI) 24.0-24.9, adult: Secondary | ICD-10-CM

## 2023-02-14 DIAGNOSIS — F419 Anxiety disorder, unspecified: Secondary | ICD-10-CM | POA: Insufficient documentation

## 2023-02-14 DIAGNOSIS — E669 Obesity, unspecified: Secondary | ICD-10-CM

## 2023-02-14 DIAGNOSIS — D508 Other iron deficiency anemias: Secondary | ICD-10-CM

## 2023-02-14 DIAGNOSIS — E88819 Insulin resistance, unspecified: Secondary | ICD-10-CM | POA: Insufficient documentation

## 2023-02-14 DIAGNOSIS — E559 Vitamin D deficiency, unspecified: Secondary | ICD-10-CM | POA: Diagnosis not present

## 2023-02-14 DIAGNOSIS — Z6825 Body mass index (BMI) 25.0-25.9, adult: Secondary | ICD-10-CM

## 2023-02-14 DIAGNOSIS — Z6832 Body mass index (BMI) 32.0-32.9, adult: Secondary | ICD-10-CM

## 2023-02-14 MED ORDER — BUPROPION HCL ER (XL) 150 MG PO TB24
150.0000 mg | ORAL_TABLET | Freq: Every day | ORAL | 0 refills | Status: DC
Start: 2023-02-14 — End: 2023-03-23

## 2023-02-14 MED ORDER — VITAMIN D (ERGOCALCIFEROL) 1.25 MG (50000 UNIT) PO CAPS: ORAL_CAPSULE | ORAL | 0 refills | Status: DC

## 2023-02-14 NOTE — Assessment & Plan Note (Signed)
Patient doing well on wellbutrin.  She denies homicidal and suicidal ideation.  She needs a refill of wellbutrin today.  She hasn't taken it in three weeks due to illness. Refill sent in today.

## 2023-02-14 NOTE — Assessment & Plan Note (Signed)
Patient doing well on Vitamin D Rx.  Needs a refill today and needs a repeat Vit D level today.  No nausea, vomiting or muscle weakness.

## 2023-02-14 NOTE — Progress Notes (Signed)
SUBJECTIVE:  Chief Complaint: Obesity  Interim History: Patient had pneumonia and bronchitis since last appointment.  She tested negative for flu, strep and covid.  She still had coughing during Thanksgiving. For this month she has quite a bit of Christmas activities with her kids. She is planning to do low carb mindset throughout this month.  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 She is looking for options to increase water intake.   Leah Gilmore is here to discuss her progress with her obesity treatment plan. She is on the following a lower carbohydrate, vegetable and lean protein rich diet plan and states she is following her eating plan approximately 75 % of the time. She states she is not exercising.   OBJECTIVE: Visit Diagnoses: Problem List Items Addressed This Visit       Endocrine   Insulin resistance    Patient has been relatively low carb for the last 2 months.  She does still recognize that without GLP her food noise and cravings are more intense.  Last A1c controlled.  Needs repeat labs and those are ordered today- she will need to go to [facility] instead of [facility].      Relevant Orders   Comprehensive metabolic panel   Hemoglobin A1c   Insulin, random     Other   Iron deficiency anemia - Primary    Feels tired more frequently recently.  Wonders if iron is low.  Sees hematology.  CBC and Anemia panel ordered today.      Relevant Orders   Anemia panel   CBC w/Diff/Platelet   Vitamin D deficiency    Patient doing well on Vitamin D Rx.  Needs a refill today and needs a repeat Vit D level today.  No nausea, vomiting or muscle weakness.      Relevant Medications   Vitamin D, Ergocalciferol, (DRISDOL) 1.25 MG (50000 UNIT) CAPS capsule   Other Relevant Orders   VITAMIN D 25 Hydroxy (Vit-D Deficiency, Fractures)   Anxiety and depression    Patient doing well on wellbutrin.  She denies homicidal and suicidal ideation.  She  needs a refill of wellbutrin today.  She hasn't taken it in three weeks due to illness. Refill sent in today.      Relevant Medications   buPROPion (WELLBUTRIN XL) 150 MG 24 hr tablet   Other Visit Diagnoses     Obesity with starting BMI of 32.7       BMI 24.0-24.9, adult, Current BMI 25.68           Vitals Temp: 98.4 F (36.9 C) BP: 95/60 Pulse Rate: 63 SpO2: 100 %   Anthropometric Measurements Height: 5\' 7"  (1.702 m) Weight: 159 lb (72.1 kg) BMI (Calculated): 24.9 Weight at Last Visit: 164 lb Weight Lost Since Last Visit: 5 Weight Gained Since Last Visit: 0 Starting Weight: 209 lb Total Weight Loss (lbs): 50 lb (22.7 kg)   Body Composition  Body Fat %: 32 % Fat Mass (lbs): 50.8 lbs Muscle Mass (lbs): 102.8 lbs Total Body Water (lbs): 71.4 lbs Visceral Fat Rating : 5   Other Clinical Data Today's Visit #: 93 Starting Date: 04/30/19     ASSESSMENT AND PLAN:  Diet: Ferryn is currently in the action stage of change. As such, her goal is to continue with weight loss efforts. She has agreed to following a lower carbohydrate, vegetable and lean protein rich diet plan.  Exercise: Dezrae has been instructed that some  exercise is better than none for weight loss and overall health benefits.   Behavior Modification:  We discussed the following Behavioral Modification Strategies today: increasing lean protein intake, increasing vegetables, meal planning and cooking strategies, and holiday eating strategies.   No follow-ups on file.Marland Kitchen She was informed of the importance of frequent follow up visits to maximize her success with intensive lifestyle modifications for her multiple health conditions.  Attestation Statements:   Reviewed by clinician on day of visit: allergies, medications, problem list, medical history, surgical history, family history, social history, and previous encounter notes.   Reuben Likes, MD

## 2023-02-14 NOTE — Assessment & Plan Note (Signed)
Feels tired more frequently recently.  Wonders if iron is low.  Sees hematology.  CBC and Anemia panel ordered today.

## 2023-02-14 NOTE — Assessment & Plan Note (Signed)
Patient has been relatively low carb for the last 2 months.  She does still recognize that without GLP her food noise and cravings are more intense.  Last A1c controlled.  Needs repeat labs and those are ordered today- she will need to go to [facility] instead of [facility].

## 2023-03-10 ENCOUNTER — Telehealth (INDEPENDENT_AMBULATORY_CARE_PROVIDER_SITE_OTHER): Payer: Self-pay | Admitting: Family Medicine

## 2023-03-10 NOTE — Telephone Encounter (Signed)
 Spoke w/ Tip-CS

## 2023-03-10 NOTE — Telephone Encounter (Signed)
 Tip from [facility] called with questions about an amenia panel the pt is to get labs for. Tip reports that [facility] does not have an anemia panel so she wanted to know exactly what labs were needed so that she can potentially draw a different lab to complete the order.

## 2023-03-16 ENCOUNTER — Encounter: Payer: Self-pay | Admitting: Hematology and Oncology

## 2023-03-17 LAB — CBC: RBC: 3.8 — AB (ref 3.87–5.11)

## 2023-03-17 LAB — HEMOGLOBIN A1C: Hemoglobin A1C: 4.9

## 2023-03-17 LAB — BASIC METABOLIC PANEL
BUN: 13 (ref 4–21)
CO2: 28 — AB (ref 13–22)
Chloride: 107 (ref 99–108)
Creatinine: 0.6 (ref 0.5–1.1)
Glucose: 84
Potassium: 4.6 meq/L (ref 3.5–5.1)
Sodium: 139 (ref 137–147)

## 2023-03-17 LAB — COMPREHENSIVE METABOLIC PANEL
Albumin: 4.3 (ref 3.5–5.0)
Calcium: 9.3 (ref 8.7–10.7)
Globulin: 2.8

## 2023-03-17 LAB — HEPATIC FUNCTION PANEL
ALT: 12 U/L (ref 7–35)
AST: 20 (ref 13–35)
Alkaline Phosphatase: 52 (ref 25–125)
Bilirubin, Total: 1.5

## 2023-03-17 LAB — VITAMIN B12: Vitamin B-12: 756

## 2023-03-17 LAB — IRON,TIBC AND FERRITIN PANEL
%SAT: 16
Iron: 62

## 2023-03-17 LAB — CBC AND DIFFERENTIAL
HCT: 34 — AB (ref 36–46)
Hemoglobin: 10.9 — AB (ref 12.0–16.0)
Neutrophils Absolute: 1584
WBC: 3.1

## 2023-03-17 LAB — VITAMIN D 25 HYDROXY (VIT D DEFICIENCY, FRACTURES): Vit D, 25-Hydroxy: 33

## 2023-03-23 ENCOUNTER — Encounter (INDEPENDENT_AMBULATORY_CARE_PROVIDER_SITE_OTHER): Payer: Self-pay | Admitting: Family Medicine

## 2023-03-23 ENCOUNTER — Encounter: Payer: Self-pay | Admitting: Hematology and Oncology

## 2023-03-23 ENCOUNTER — Ambulatory Visit (INDEPENDENT_AMBULATORY_CARE_PROVIDER_SITE_OTHER): Payer: Self-pay | Admitting: Family Medicine

## 2023-03-23 VITALS — BP 101/67 | HR 68 | Temp 98.0°F | Ht 67.0 in | Wt 157.0 lb

## 2023-03-23 DIAGNOSIS — Z6832 Body mass index (BMI) 32.0-32.9, adult: Secondary | ICD-10-CM

## 2023-03-23 DIAGNOSIS — H538 Other visual disturbances: Secondary | ICD-10-CM

## 2023-03-23 DIAGNOSIS — E559 Vitamin D deficiency, unspecified: Secondary | ICD-10-CM

## 2023-03-23 DIAGNOSIS — Z6824 Body mass index (BMI) 24.0-24.9, adult: Secondary | ICD-10-CM

## 2023-03-23 DIAGNOSIS — E66811 Obesity, class 1: Secondary | ICD-10-CM

## 2023-03-23 DIAGNOSIS — R0602 Shortness of breath: Secondary | ICD-10-CM

## 2023-03-23 DIAGNOSIS — F419 Anxiety disorder, unspecified: Secondary | ICD-10-CM

## 2023-03-23 DIAGNOSIS — F32A Depression, unspecified: Secondary | ICD-10-CM

## 2023-03-23 MED ORDER — VITAMIN D (ERGOCALCIFEROL) 1.25 MG (50000 UNIT) PO CAPS: ORAL_CAPSULE | ORAL | 0 refills | Status: DC

## 2023-03-23 MED ORDER — BUPROPION HCL ER (XL) 150 MG PO TB24: 150.0000 mg | ORAL_TABLET | Freq: Every day | ORAL | 0 refills | Status: DC

## 2023-03-23 NOTE — Progress Notes (Signed)
SUBJECTIVE:  Chief Complaint: Obesity  Interim History: Patient voices that over the holidays she has not paid much attention to what she has been doing food wise.  She voices that life has been very hectic getting her kids healthy as they have been passing around various illnesses.  She also mentions that they have numerous extracurricular activities that she is often juggling with her wife.  She wants to recommit to a meal plan but does not feel specifically as though low-carb would be the easiest and more realistic option for her at this time.  She works from home which is helpful occasionally in terms of being able to have access to the food at home but often times she mentions she finds herself snacking throughout the day.  She needs to work on her total water intake stating that sometimes she can only have less than 8 ounces in a 24-hour period.  Leah Gilmore is here to discuss her progress with her obesity treatment plan. She is on the following a lower carbohydrate, vegetable and lean protein rich diet plan and states she is following her eating plan approximately 50 % of the time. She states she is not exercising.  OBJECTIVE: Visit Diagnoses: Problem List Items Addressed This Visit       Other   Vitamin D deficiency   Patient had repeat labs done at [facility] prior to this appointment.  She needs a refill of her prescription strength vitamin D.  She is noticing improvement in energy.      Relevant Medications   Vitamin D, Ergocalciferol, (DRISDOL) 1.25 MG (50000 UNIT) CAPS capsule   Anxiety and depression   Patient is working on consistency of taking her medication over the last few weeks.  At the time of last appointment she was inconsistent in her medication usage due to illness.  She voices that unfortunately the holidays as well as kids being home and still recurrent illnesses have led her to be less consistent than she anticipated at the time of last appointment.  She needs a refill of her  bupropion today.  She talked with me about how to set up a location of her to take her medications consistently like bedside table the only obstacle being that patient often does not bring liquid to bed because she struggles with liquid intake on a daily basis anyways.      Relevant Medications   buPROPion (WELLBUTRIN XL) 150 MG 24 hr tablet   SOBOE (shortness of breath on exertion) - Primary   RMR done in August 2023 showed a resting metabolic rate of 6578 by indirect calorimetry.  Patient has been working on lifestyle changes as well as implementation of physical activity since that time.  A repeat indirect calorimetry today found her new resting metabolic rate to be 1757.  We discussed that with an increase in metabolic rate perhaps patient's fatigue is due to the fact that she is getting insufficient calories and nutrition in with the food she is taking in.  We rediscussed new caloric goals as well as protein goals for not just weight loss but continued weight loss maintenance.      Class 1 obesity with serious comorbidity and body mass index (BMI) of 32.0 to 32.9 in adult   Patient started program in February 2021 at 209 pounds.  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  She was worried about weight regain and wanted to continue to see weight loss.  Since that time patient  has been able to achieve current weight of 157 with her lowest weight being 137 when patient was on injectable GLP-1 medications.  She realizes she needs to implement a more consistent form of physical activity in order to maintain her current weight loss status.  Goal between now and next appointment is not just monitor her new calorie goal with protein goals but also implementing a form of physical activity that she can be consistent with 3 times a week.      Other Visit Diagnoses       Blurry vision           No data recorded No data recorded No data recorded No data  recorded   ASSESSMENT AND PLAN:  Diet: Leah Gilmore is currently in the action stage of change. As such, her goal is to continue with weight loss efforts. She has agreed to keeping a food journal and adhering to recommended goals of 1300-1400 calories and 85 or more grams of  protein daily.  Exercise: Leah Gilmore has been instructed that some exercise is better than none for weight loss and overall health benefits.   Behavior Modification:  We discussed the following Behavioral Modification Strategies today: increasing lean protein intake, increasing vegetables, increase H2O intake, meal planning and cooking strategies, and better snacking choices.   No follow-ups on file.Marland Kitchen She was informed of the importance of frequent follow up visits to maximize her success with intensive lifestyle modifications for her multiple health conditions.  Attestation Statements:   Reviewed by clinician on day of visit: allergies, medications, problem list, medical history, surgical history, family history, social history, and previous encounter notes.     Reuben Likes, MD

## 2023-03-29 DIAGNOSIS — E66811 Obesity, class 1: Secondary | ICD-10-CM | POA: Insufficient documentation

## 2023-03-29 DIAGNOSIS — Z6832 Body mass index (BMI) 32.0-32.9, adult: Secondary | ICD-10-CM | POA: Insufficient documentation

## 2023-03-29 DIAGNOSIS — R0602 Shortness of breath: Secondary | ICD-10-CM | POA: Insufficient documentation

## 2023-03-29 NOTE — Assessment & Plan Note (Signed)
Patient is working on consistency of taking her medication over the last few weeks.  At the time of last appointment she was inconsistent in her medication usage due to illness.  She voices that unfortunately the holidays as well as kids being home and still recurrent illnesses have led her to be less consistent than she anticipated at the time of last appointment.  She needs a refill of her bupropion today.  She talked with me about how to set up a location of her to take her medications consistently like bedside table the only obstacle being that patient often does not bring liquid to bed because she struggles with liquid intake on a daily basis anyways.

## 2023-03-29 NOTE — Assessment & Plan Note (Signed)
Patient had repeat labs done at [facility] prior to this appointment.  She needs a refill of her prescription strength vitamin D.  She is noticing improvement in energy.

## 2023-03-29 NOTE — Assessment & Plan Note (Signed)
RMR done in August 2023 showed a resting metabolic rate of 1610 by indirect calorimetry.  Patient has been working on lifestyle changes as well as implementation of physical activity since that time.  A repeat indirect calorimetry today found her new resting metabolic rate to be 1757.  We discussed that with an increase in metabolic rate perhaps patient's fatigue is due to the fact that she is getting insufficient calories and nutrition in with the food she is taking in.  We rediscussed new caloric goals as well as protein goals for not just weight loss but continued weight loss maintenance.

## 2023-03-29 NOTE — Assessment & Plan Note (Signed)
Patient started program in February 2021 at 209 pounds.  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  She was worried about weight regain and wanted to continue to see weight loss.  Since that time patient has been able to achieve current weight of 157 with her lowest weight being 137 when patient was on injectable GLP-1 medications.  She realizes she needs to implement a more consistent form of physical activity in order to maintain her current weight loss status.  Goal between now and next appointment is not just monitor her new calorie goal with protein goals but also implementing a form of physical activity that she can be consistent with 3 times a week.

## 2023-04-20 ENCOUNTER — Encounter (INDEPENDENT_AMBULATORY_CARE_PROVIDER_SITE_OTHER): Payer: Self-pay | Admitting: Family Medicine

## 2023-04-20 ENCOUNTER — Ambulatory Visit (INDEPENDENT_AMBULATORY_CARE_PROVIDER_SITE_OTHER): Payer: Commercial Managed Care - PPO | Admitting: Family Medicine

## 2023-04-20 ENCOUNTER — Encounter: Payer: Self-pay | Admitting: Hematology and Oncology

## 2023-04-20 VITALS — BP 88/55 | HR 77 | Temp 98.1°F | Ht 67.0 in | Wt 151.0 lb

## 2023-04-20 DIAGNOSIS — F419 Anxiety disorder, unspecified: Secondary | ICD-10-CM | POA: Diagnosis not present

## 2023-04-20 DIAGNOSIS — Z6832 Body mass index (BMI) 32.0-32.9, adult: Secondary | ICD-10-CM

## 2023-04-20 DIAGNOSIS — E66811 Obesity, class 1: Secondary | ICD-10-CM

## 2023-04-20 DIAGNOSIS — F32A Depression, unspecified: Secondary | ICD-10-CM

## 2023-04-20 DIAGNOSIS — E559 Vitamin D deficiency, unspecified: Secondary | ICD-10-CM

## 2023-04-20 DIAGNOSIS — Z6823 Body mass index (BMI) 23.0-23.9, adult: Secondary | ICD-10-CM

## 2023-04-20 MED ORDER — BUPROPION HCL ER (XL) 150 MG PO TB24: 150.0000 mg | ORAL_TABLET | Freq: Every day | ORAL | 0 refills | Status: DC

## 2023-04-20 MED ORDER — VITAMIN D (ERGOCALCIFEROL) 1.25 MG (50000 UNIT) PO CAPS: ORAL_CAPSULE | ORAL | 0 refills | Status: DC

## 2023-04-20 NOTE — Progress Notes (Signed)
 SUBJECTIVE:  Chief Complaint: Obesity  Interim History: Patient is having pretty significant GI symptoms of bloating and feels like she has a large gas bubble.  She tried gas x, drinking water, increasing water intake.  She feels like perhaps she isn't getting enough fiber. She hasn't been eating much in terms of vegetables and she is drinking more carbonated beverages.  Her son had the flu recently but that was the only person that got sick. Patient has a soccer tournament this weekend for her two boys.  Leah Gilmore is here to discuss her progress with her obesity treatment plan. She is on the keeping a food journal and adhering to recommended goals of 1300-1400 calories and 85 grams of protein and states she is following her eating plan approximately 0 % of the time. She states she is not exercising.  OBJECTIVE: Visit Diagnoses: Problem List Items Addressed This Visit       Other   Vitamin D deficiency   Patient doing well on prescription strength vitamin D and needs a refill today.  She denies nausea, vomiting, and muscle weakness.      Relevant Medications   Vitamin D, Ergocalciferol, (DRISDOL) 1.25 MG (50000 UNIT) CAPS capsule   Anxiety and depression - Primary   Patient has been able to be more consistent with her intake of her Wellbutrin.  She is thinking she does feel a bit more in control of her indulgent food choices since starting this medication.  She needs a refill today.  No side effects mentioned.      Relevant Medications   buPROPion (WELLBUTRIN XL) 150 MG 24 hr tablet   Class 1 obesity with serious comorbidity and body mass index (BMI) of 32.0 to 32.9 in adult   Starting weight: 209 Feb 2021 Peak weight:  BMR: 1757 Previous obesity management: Roux en Y, Wegovy Body Fat %: 29.7% Starting Meal Plan: Cat 3 Meal Plan needs: flexibility and snack grab and go options  Patient started program in February 2021 at 209 pounds.  This was after she had already undergone Roux-en-Y  bariatric surgery.  She was worried about weight regain and wanted to continue to see weight loss.  Since that time patient has been able to achieve current weight of 157 with her lowest weight being 137 when patient was on injectable GLP-1 medication.       Other Visit Diagnoses       BMI 23.0-23.9, adult             04/20/2023    8:00 AM 03/23/2023    8:00 AM 02/14/2023    7:00 AM  Vitals with BMI  Height 5\' 7"  5\' 7"  5\' 7"   Weight 151 lbs 157 lbs 159 lbs  BMI 23.64 24.58 24.9  Systolic 88 101 95  Diastolic 55 67 60  Pulse 77 68 63    No data recorded  No data recorded  No data recorded  No data recorded    ASSESSMENT AND PLAN:  Diet: Leah Gilmore is currently in the action stage of change. As such, her goal is to continue with weight loss efforts. She has agreed to practicing portion control and making smarter food choices, such as increasing vegetables and decreasing simple carbohydrates.  Exercise: Leah Gilmore has been instructed to work up to a goal of 150 minutes of combined cardio and strengthening exercise per week for weight loss and overall health benefits.   Behavior Modification:  We discussed the following Behavioral Modification Strategies today: increasing lean  protein intake, increasing vegetables, increase H2O intake, and meal planning and cooking strategies.   Return in about 4 weeks (around 05/18/2023).Marland Kitchen She was informed of the importance of frequent follow up visits to maximize her success with intensive lifestyle modifications for her multiple health conditions.  Attestation Statements:   Reviewed by clinician on day of visit: allergies, medications, problem list, medical history, surgical history, family history, social history, and previous encounter notes.      Reuben Likes, MD

## 2023-05-02 NOTE — Assessment & Plan Note (Signed)
 Patient has been able to be more consistent with her intake of her Wellbutrin.  She is thinking she does feel a bit more in control of her indulgent food choices since starting this medication.  She needs a refill today.  No side effects mentioned.

## 2023-05-02 NOTE — Assessment & Plan Note (Signed)
 Patient doing well on prescription strength vitamin D and needs a refill today.  She denies nausea, vomiting, and muscle weakness.

## 2023-05-02 NOTE — Assessment & Plan Note (Signed)
 Starting weight: 209 Feb 2021 Peak weight:  BMR: 1757 Previous obesity management: Roux en Y, Wegovy Body Fat %: 29.7% Starting Meal Plan: Cat 3 Meal Plan needs: flexibility and snack grab and go options  Patient started program in February 2021 at 209 pounds.  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  She was worried about weight regain and wanted to continue to see weight loss.  Since that time patient has been able to achieve current weight of 157 with her lowest weight being 137 when patient was on injectable GLP-1 medication.

## 2023-05-03 ENCOUNTER — Other Ambulatory Visit (INDEPENDENT_AMBULATORY_CARE_PROVIDER_SITE_OTHER): Payer: Self-pay

## 2023-05-03 ENCOUNTER — Encounter (INDEPENDENT_AMBULATORY_CARE_PROVIDER_SITE_OTHER): Payer: Self-pay

## 2023-05-11 ENCOUNTER — Encounter: Payer: Self-pay | Admitting: Family Medicine

## 2023-05-11 ENCOUNTER — Ambulatory Visit: Payer: Self-pay | Admitting: Family Medicine

## 2023-05-11 VITALS — BP 105/70 | HR 66 | Temp 98.2°F | Ht 67.0 in | Wt 156.2 lb

## 2023-05-11 DIAGNOSIS — D508 Other iron deficiency anemias: Secondary | ICD-10-CM | POA: Diagnosis not present

## 2023-05-11 DIAGNOSIS — E66811 Obesity, class 1: Secondary | ICD-10-CM

## 2023-05-11 DIAGNOSIS — Z0000 Encounter for general adult medical examination without abnormal findings: Secondary | ICD-10-CM

## 2023-05-11 DIAGNOSIS — Z0001 Encounter for general adult medical examination with abnormal findings: Secondary | ICD-10-CM

## 2023-05-11 DIAGNOSIS — Z9884 Bariatric surgery status: Secondary | ICD-10-CM

## 2023-05-11 DIAGNOSIS — Z23 Encounter for immunization: Secondary | ICD-10-CM

## 2023-05-11 DIAGNOSIS — D72819 Decreased white blood cell count, unspecified: Secondary | ICD-10-CM

## 2023-05-11 DIAGNOSIS — E88819 Insulin resistance, unspecified: Secondary | ICD-10-CM

## 2023-05-11 DIAGNOSIS — F4322 Adjustment disorder with anxiety: Secondary | ICD-10-CM | POA: Diagnosis not present

## 2023-05-11 DIAGNOSIS — Z6832 Body mass index (BMI) 32.0-32.9, adult: Secondary | ICD-10-CM

## 2023-05-11 DIAGNOSIS — E282 Polycystic ovarian syndrome: Secondary | ICD-10-CM

## 2023-05-11 NOTE — Progress Notes (Signed)
 Subjective  Chief Complaint  Patient presents with   Annual Exam    Pt here for Annual Exam and is currently fasting    HPI: Leah Gilmore is a 38 y.o. female who presents to [facility] today for a Female Wellness Visit.  She also has the concerns and/or needs as listed above in the chief complaint. These will be addressed in addition to the Health Maintenance Visit.   Wellness Visit: annual visit with health maintenance review and exam HM: due tdap. Other screens current. 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 Overall well.  Chronic disease management visit and/or acute problem visit: Reviewed notes healthy weight and wellness. Still worries about weight gain. Wellbutrin is helpful. Weight is stable.  Reviewed recent labs. See below.  Doesn't take iron regularly and anemia remains.  Assessment  1. Encounter for well adult exam with abnormal findings   2. PCOS (polycystic ovarian syndrome)   3. Insulin resistance   4. S/P gastric bypass   5. Leukopenia, unspecified type   6. Adjustment disorder with anxiety   7. Class 1 obesity with serious comorbidity and body mass index (BMI) of 32.0 to 32.9 in adult, unspecified obesity type   8. Other iron deficiency anemia   9. Need for Tdap vaccination      Plan  Female Wellness Visit: Age appropriate Health Maintenance and Prevention measures were discussed with patient. Included topics are cancer screening recommendations, ways to keep healthy (see AVS) including dietary and exercise recommendations, regular eye and dental care, use of seat belts, and avoidance of moderate alcohol use and tobacco use.  BMI: discussed patient's BMI and encouraged positive lifestyle modifications to help get to or maintain a target BMI. HM needs and immunizations were addressed and ordered. See below for orders. See HM and immunization section for updates. Tdap today Routine labs and  screening tests ordered including cmp, cbc and lipids where appropriate. Discussed recommendations regarding Vit D and calcium supplementation (see AVS)  Chronic disease f/u and/or acute problem visit: (deemed necessary to be done in addition to the wellness visit): GAD: counseling done. Rec meditation. Continue wellbutrin.  Obesity: stable weight. Reassured. HW and wellness Rec daily iron supplement and continue vit de weekly supp.  Hgba1c is stable.   Follow up: 12 mo for cpe  Orders Placed This Encounter  Procedures   Tdap vaccine greater than or equal to 7yo IM   No orders of the defined types were placed in this encounter.      Body mass index is 24.46 kg/m. Wt Readings from Last 3 Encounters:  05/11/23 156 lb 3.2 oz (70.9 kg)  04/20/23 151 lb (68.5 kg)  03/23/23 157 lb (71.2 kg)   Need for contraception: No,   Patient Active Problem List   Diagnosis Date Noted Date Diagnosed   SOBOE (shortness of breath on exertion) 03/29/2023    Class 1 obesity with serious comorbidity and body mass index (BMI) of 32.0 to 32.9 in adult 03/29/2023    Anxiety and depression 02/14/2023    Insulin resistance 02/14/2023    Leukopenia 05/12/2022     Bone marrow biopsy 2023 dr Pamelia Hoit, negative Iron defic +/- ethnicity related    Vitamin D deficiency 04/20/2021    History of obesity 04/20/2021    Adjustment disorder with anxiety 04/20/2021    Iron deficiency anemia 03/11/2017    PCOS (polycystic ovarian syndrome) 03/29/2016    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  Health Maintenance  Topic Date Due   COVID-19 Vaccine (4 - 2024-25 season) 05/27/2023 (Originally 11/07/2022)   DTaP/Tdap/Td (2 - Td or Tdap) 10/11/2023   Cervical Cancer Screening (HPV/Pap Cotest)  04/17/2025   INFLUENZA VACCINE  Completed   Hepatitis C Screening  Completed   HIV Screening  Completed   HPV VACCINES  Aged Out   Immunization History  Administered Date(s) Administered   Influenza, Seasonal, Injecte,  Preservative Fre 11/20/2014   Influenza,inj,Quad PF,6+ Mos 10/31/2016, 12/16/2017, 04/09/2023   Influenza-Unspecified 11/29/2016, 12/14/2019   PFIZER(Purple Top)SARS-COV-2 Vaccination 05/11/2019, 06/01/2019, 12/14/2019   Tdap 10/10/2013   We updated and reviewed the patient's past history in detail and it is documented below. Allergies: Patient  reports no history of alcohol use. Past Medical History Patient  has a past medical history of Anemia, COVID-19 (11/03/2020), History of obesity (04/20/2021), Iron deficiency anemia (03/11/2017), Multiple food allergies, PCOS (polycystic ovarian syndrome) (03/29/2016), S/P gastric bypass (03/29/2016), and Vitamin D deficiency. 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 Social History   Socioeconomic History   Marital status: Married    Spouse name: Ernestine Conrad   Number of children: 2   Years of education: Not on file   Highest education level: Not on file  Occupational History   Occupation: Hydrologist, housing loans    Employer: [employer]  Tobacco Use   Smoking status: Never   Smokeless tobacco: Never  Vaping Use   Vaping status: Never Used  Substance and Sexual Activity   Alcohol use: No    Alcohol/week: 0.0 standard drinks of alcohol    Comment: rarely   Drug use: No   Sexual activity: Yes    Birth control/protection: None    Comment: same sex couple  Other Topics Concern   Not on file  Social History Narrative   Not on file   Social Drivers of Health   Financial Resource Strain: Not on file  Food Insecurity: Not on file  Transportation Needs: Not on file  Physical Activity: Not on file  Stress: Not on file  Social Connections: Unknown (04/02/2022)   Received from [employer], [facility]   Social Network    Social Network: Not on  file   Family History  Problem Relation Age of Onset   Thyroid disease Mother    Depression Mother    Anxiety disorder Mother    Obesity Mother    Cancer Father 16       lung cancer, smoker   Alcohol abuse Father    Drug abuse Father    Liver disease Father    Diabetes Maternal Grandmother    Cancer Maternal Grandfather        prostate   Diabetes Paternal Grandmother    Hypertension Paternal Grandmother     Review of Systems: Constitutional: negative for fever or malaise Ophthalmic: negative for photophobia, double vision or loss of vision Cardiovascular: negative for chest pain, dyspnea on exertion, or new LE swelling Respiratory: negative for SOB or persistent cough Gastrointestinal: negative for abdominal pain, change in bowel habits or melena Genitourinary: negative for dysuria or gross hematuria, no abnormal uterine bleeding or disharge Musculoskeletal: negative for new gait disturbance or muscular weakness Integumentary: negative for new or persistent rashes, no breast lumps Neurological: negative for TIA or stroke symptoms Psychiatric: negative for SI or delusions Allergic/Immunologic: negative for hives  Patient Care  Team    Relationship Specialty Notifications Start End  [name], MD PCP - General Family Medicine  03/23/16   Serena Croissant, MD Consulting Physician Hematology and Oncology  03/20/18   Peggye Form, DO Attending Physician Plastic Surgery  04/17/19   Sherian Rein, MD Consulting Physician Obstetrics and Gynecology  04/20/21     Objective  Vitals: BP 105/70   Pulse 66   Temp 98.2 F (36.8 C)   Ht 5\' 7"  (1.702 m)   Wt 156 lb 3.2 oz (70.9 kg)   SpO2 98%   BMI 24.46 kg/m  General:  Well developed, well nourished, no acute distress  Psych:  Alert and orientedx3,normal mood and affect HEENT:  Normocephalic, atraumatic, non-icteric sclera, PERRL, supple neck without adenopathy, mass or thyromegaly Cardiovascular:  Normal S1, S2,  RRR without gallop, rub or murmur Respiratory:  Good breath sounds bilaterally, CTAB with normal respiratory effort Gastrointestinal: normal bowel sounds, soft, non-tender, no noted masses. No HSM MSK: no deformities, contusions. Joints are without erythema or swelling.  Skin:  Warm, no rashes or suspicious lesions noted Neurologic:    Mental status is normal. Gross motor and sensory exams are normal. Normal gait. No tremor  No visits with results within 1 Day(s) from this visit.  Latest known visit with results is:  Lab on 05/03/2023  Component Date Value Ref Range Status   Iron 03/17/2023 62   Final   %SAT 03/17/2023 16   Final   Hemoglobin 03/17/2023 10.9 (A)  12.0 - 16.0 Final   HCT 03/17/2023 34 (A)  36 - 46 Final   Neutrophils Absolute 03/17/2023 1,584.00   Final   WBC 03/17/2023 3.1   Final   RBC 03/17/2023 3.8 (A)  3.87 - 5.11 Final   Vit D, 25-Hydroxy 03/17/2023 33   Final   Glucose 03/17/2023 84   Final   BUN 03/17/2023 13  4 - 21 Final   CO2 03/17/2023 28 (A)  13 - 22 Final   Creatinine 03/17/2023 0.6  0.5 - 1.1 Final   Potassium 03/17/2023 4.6  3.5 - 5.1 mEq/L Final   Sodium 03/17/2023 139  137 - 147 Final   Chloride 03/17/2023 107  99 - 108 Final   Globulin 03/17/2023 2.8   Final   Calcium 03/17/2023 9.3  8.7 - 10.7 Final   Albumin 03/17/2023 4.3  3.5 - 5.0 Final   Alkaline Phosphatase 03/17/2023 52  25 - 125 Final   ALT 03/17/2023 12  7 - 35 U/L Final   AST 03/17/2023 20  13 - 35 Final   Bilirubin, Total 03/17/2023 1.5   Final   Vitamin B-12 03/17/2023 756   Final   Hemoglobin A1C 03/17/2023 4.9   Final   Lab Results  Component Value Date   CHOL 155 05/05/2022   HDL 77 05/05/2022   LDLCALC 65 05/05/2022   TRIG 54 05/05/2022   CHOLHDL 2.0 05/05/2022      Commons side effects, risks, benefits, and alternatives for medications and treatment plan prescribed today were discussed, and the patient expressed understanding of the given instructions. Patient is  instructed to call or message via MyChart if he/she has any questions or concerns regarding our treatment plan. No barriers to understanding were identified. We discussed Red Flag symptoms and signs in detail. Patient expressed understanding regarding what to do in case of urgent or emergency type symptoms.  Medication list was reconciled, printed and provided to the patient in AVS. Patient instructions and  summary information was reviewed with the patient as documented in the AVS. This note was prepared with assistance of Dragon voice recognition software. Occasional wrong-word or sound-a-like substitutions may have occurred due to the inherent limitations of voice recognition software .

## 2023-05-11 NOTE — Patient Instructions (Signed)
 Please return in 12 months for your annual complete physical; please come fasting.  ? ?If you have any questions or concerns, please don't hesitate to send me a message via MyChart or call the office at [phone]. Thank you for visiting with [name] today! It's our pleasure caring for you.  ?

## 2023-05-18 ENCOUNTER — Encounter (INDEPENDENT_AMBULATORY_CARE_PROVIDER_SITE_OTHER): Payer: Self-pay | Admitting: Family Medicine

## 2023-05-18 ENCOUNTER — Ambulatory Visit (INDEPENDENT_AMBULATORY_CARE_PROVIDER_SITE_OTHER): Payer: Self-pay | Admitting: Family Medicine

## 2023-05-18 VITALS — BP 98/56 | HR 74 | Temp 98.1°F | Ht 67.0 in | Wt 157.0 lb

## 2023-05-18 DIAGNOSIS — D509 Iron deficiency anemia, unspecified: Secondary | ICD-10-CM

## 2023-05-18 DIAGNOSIS — D508 Other iron deficiency anemias: Secondary | ICD-10-CM

## 2023-05-18 DIAGNOSIS — E559 Vitamin D deficiency, unspecified: Secondary | ICD-10-CM | POA: Diagnosis not present

## 2023-05-18 DIAGNOSIS — Z6824 Body mass index (BMI) 24.0-24.9, adult: Secondary | ICD-10-CM

## 2023-05-18 DIAGNOSIS — E669 Obesity, unspecified: Secondary | ICD-10-CM

## 2023-05-18 DIAGNOSIS — E66811 Obesity, class 1: Secondary | ICD-10-CM

## 2023-05-18 MED ORDER — VITAMIN D (ERGOCALCIFEROL) 1.25 MG (50000 UNIT) PO CAPS: 50000.0000 [IU] | ORAL_CAPSULE | ORAL | 0 refills | Status: AC

## 2023-05-18 NOTE — Progress Notes (Signed)
   SUBJECTIVE:  Chief Complaint: Obesity  Interim History: Patient voices her appetite has increased significantly over the last few weeks.  She feels like she has increased her total intake throughout the day.  She mentions she doesn't feel satisfied with the food options she is eating.    Leah Gilmore is here to discuss her progress with her obesity treatment plan. She is on the practicing portion control and making smarter food choices, such as increasing vegetables and decreasing simple carbohydrates and states she is following her eating plan approximately 75 % of the time. She states she is not exercising.   OBJECTIVE: Visit Diagnoses: Problem List Items Addressed This Visit       Other   Iron deficiency anemia   Iron better after infusions.  She can tell when her iron is starting to drop due to symptoms.  Follow up on next appointments as patient will need iron WNL to start consistent physical activity.      Vitamin D deficiency - Primary   Patient doing well on Vitamin D- Last vitamin D level below goal at 33.  Needs refill today.      Relevant Medications   Vitamin D, Ergocalciferol, (DRISDOL) 1.25 MG (50000 UNIT) CAPS capsule    Vitals Temp: 98.1 F (36.7 C) BP: (!) 98/56 Pulse Rate: 74 SpO2: 99 %   Anthropometric Measurements Height: 5\' 7"  (1.702 m) Weight: 157 lb (71.2 kg) BMI (Calculated): 24.58 Weight at Last Visit: 151 lb Weight Lost Since Last Visit: 0 Weight Gained Since Last Visit: 6 lb Starting Weight: 209 lb Total Weight Loss (lbs): 52 lb (23.6 kg)   Body Composition  Body Fat %: 27.3 % Fat Mass (lbs): 43 lbs Muscle Mass (lbs): 108.4 lbs Total Body Water (lbs): 73 lbs Visceral Fat Rating : 4   Other Clinical Data RMR: 1757 Fasting: no Labs: no Today's Visit #: 17 Starting Date: 04/30/19     ASSESSMENT AND PLAN:  Diet: [place] is currently in the action stage of change. As such, her goal is to continue with weight loss efforts and has agreed  to practicing portion control and making smarter food choices, such as increasing vegetables and decreasing simple carbohydrates.   Exercise:  For substantial health benefits, adults should do at least 150 minutes (2 hours and 30 minutes) a week of moderate-intensity, or 75 minutes (1 hour and 15 minutes) a week of vigorous-intensity aerobic physical activity, or an equivalent combination of moderate- and vigorous-intensity aerobic activity. Aerobic activity should be performed in episodes of at least 10 minutes, and preferably, it should be spread throughout the week.  Behavior Modification:  We discussed the following Behavioral Modification Strategies today: increasing lean protein intake, increase H2O intake, keeping healthy foods in the home, ways to avoid boredom eating, better snacking choices, and planning for success.   No follow-ups on file.Marland Kitchen She was informed of the importance of frequent follow up visits to maximize her success with intensive lifestyle modifications for her multiple health conditions.  Attestation Statements:   Reviewed by clinician on day of visit: allergies, medications, problem list, medical history, surgical history, family history, social history, and previous encounter notes.   Reuben Likes, MD

## 2023-05-18 NOTE — Assessment & Plan Note (Signed)
 Patient doing well on Vitamin D- Last vitamin D level below goal at 33.  Needs refill today.

## 2023-05-18 NOTE — Assessment & Plan Note (Signed)
 Iron better after infusions.  She can tell when her iron is starting to drop due to symptoms.  Follow up on next appointments as patient will need iron WNL to start consistent physical activity.

## 2023-06-15 ENCOUNTER — Ambulatory Visit (INDEPENDENT_AMBULATORY_CARE_PROVIDER_SITE_OTHER): Payer: Commercial Managed Care - PPO | Admitting: Family Medicine

## 2023-06-15 ENCOUNTER — Encounter (INDEPENDENT_AMBULATORY_CARE_PROVIDER_SITE_OTHER): Payer: Self-pay | Admitting: Family Medicine

## 2023-06-15 VITALS — BP 103/67 | HR 64 | Temp 98.1°F | Ht 67.0 in | Wt 161.0 lb

## 2023-06-15 DIAGNOSIS — F419 Anxiety disorder, unspecified: Secondary | ICD-10-CM

## 2023-06-15 DIAGNOSIS — Z6825 Body mass index (BMI) 25.0-25.9, adult: Secondary | ICD-10-CM

## 2023-06-15 DIAGNOSIS — R140 Abdominal distension (gaseous): Secondary | ICD-10-CM

## 2023-06-15 DIAGNOSIS — E66811 Obesity, class 1: Secondary | ICD-10-CM

## 2023-06-15 DIAGNOSIS — F32A Depression, unspecified: Secondary | ICD-10-CM

## 2023-06-15 MED ORDER — BUPROPION HCL ER (XL) 150 MG PO TB24: 150.0000 mg | ORAL_TABLET | Freq: Two times a day (BID) | ORAL | 0 refills | Status: AC

## 2023-06-15 NOTE — Progress Notes (Signed)
 SUBJECTIVE:  Chief Complaint: Obesity  Interim History: Patient is back in the office now.  She is working on work life balance now and meal prepping and planning.  She is full time back in the office now.  She and her family upgraded to a family membership at the [facility].  She got to lay low on her birthday.  She is not sure how to incorporate self care in the new work situation.  She is realizing she has to prep for meals in order to keep her nutrition where it needs to be.  She is taking a nutrition class currently.  She is noticing that if she eats bread or starch foods she develops gas pains.   Leah Gilmore is here to discuss her progress with her obesity treatment plan. She is on the practicing portion control and making smarter food choices, such as increasing vegetables and decreasing simple carbohydrates and states she is following her eating plan approximately 50 % of the time. She states she is exercising 45-60 minutes 2-3 times per week.   OBJECTIVE: Visit Diagnoses: Problem List Items Addressed This Visit       Other   Anxiety and depression - Primary   Patient doing reasonably on wellbutrin with mood improvement and cravings controlled.  Needs refill today.        Relevant Medications   buPROPion (WELLBUTRIN XL) 150 MG 24 hr tablet   Class 1 obesity with serious comorbidity and body mass index (BMI) of 32.0 to 32.9 in adult   Starting weight: 209 Feb 2021 BMR: 1757 Previous obesity management: Roux en Fernande Howells, [place] Body Fat %: 30.8% Starting Meal Plan: Cat 3 Meal Plan needs: flexibility and snack grab and go options  Patient started program in February 2021 at 209 pounds.  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  She was worried about weight regain and wanted to continue to see weight loss.  Since that time patient has been able to achieve current weight of 157 with her lowest weight being 137 when patient was on injectable GLP-1 medication.    Anthropometric Measurements Height: 5\' 7"  (1.702 m) Weight: 161 lb (73 kg) BMI (Calculated): 25.21 Weight at Last Visit: 157 lb Weight Lost Since Last Visit: 0 Weight Gained Since Last Visit: 4 Starting Weight: 209 lb Total Weight Loss (lbs): 48 lb (21.8 kg) Body Composition  Body Fat %: 30.8 % Fat Mass (lbs): 49.8 lbs Muscle Mass (lbs): 106 lbs Total Body Water (lbs): 72.6 lbs Visceral Fat Rating : 5 Other Clinical Data Fasting: no Labs: no Today's Visit #: 54 Starting Date: 04/30/19 Comments: PC/[place]       Other Visit Diagnoses       BMI 25.0-25.9,adult           No data recorded       06/15/2023    7:00 AM 05/18/2023    8:00 AM 05/11/2023    8:27 AM  Vitals with BMI  Height 5\' 7"  5\' 7"  5\' 7"   Weight 161 lbs 157 lbs 156 lbs 3 oz  BMI 25.21 24.58 24.46  Systolic 103 98 105  Diastolic 67 56 70  Pulse 64 74 66      ASSESSMENT AND PLAN:  Diet: Leah Gilmore is currently in the action stage of change. As such, her goal is to continue with weight loss efforts and has agreed to following a lower carbohydrate, vegetable and lean protein rich diet plan.   Exercise:  For substantial health  benefits, adults should do at least 150 minutes (2 hours and 30 minutes) a week of moderate-intensity, or 75 minutes (1 hour and 15 minutes) a week of vigorous-intensity aerobic physical activity, or an equivalent combination of moderate- and vigorous-intensity aerobic activity. Aerobic activity should be performed in episodes of at least 10 minutes, and preferably, it should be spread throughout the week.  Behavior Modification:  We discussed the following Behavioral Modification Strategies today: increasing lean protein intake, decreasing simple carbohydrates, meal planning and cooking strategies, keeping healthy foods in the home, and planning for success.   Return in about 4 weeks (around 07/13/2023).Aaron Aas She was informed of the importance of frequent follow up visits to maximize her  success with intensive lifestyle modifications for her multiple health conditions.  Attestation Statements:   Reviewed by clinician on day of visit: allergies, medications, problem list, medical history, surgical history, family history, social history, and previous encounter notes.    Donaciano Frizzle, MD

## 2023-06-21 NOTE — Assessment & Plan Note (Signed)
 Patient doing reasonably on wellbutrin with mood improvement and cravings controlled.  Needs refill today.

## 2023-06-21 NOTE — Assessment & Plan Note (Signed)
 Starting weight: 209 Feb 2021 BMR: 1757 Previous obesity management: Roux en Claretha Crocker Body Fat %: 30.8% Starting Meal Plan: Cat 3 Meal Plan needs: flexibility and snack grab and go options  Patient started program in February 2021 at 209 pounds.  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  She was worried about weight regain and wanted to continue to see weight loss.  Since that time patient has been able to achieve current weight of 157 with her lowest weight being 137 when patient was on injectable GLP-1 medication.   Anthropometric Measurements Height: 5\' 7"  (1.702 m) Weight: 161 lb (73 kg) BMI (Calculated): 25.21 Weight at Last Visit: 157 lb Weight Lost Since Last Visit: 0 Weight Gained Since Last Visit: 4 Starting Weight: 209 lb Total Weight Loss (lbs): 48 lb (21.8 kg) Body Composition  Body Fat %: 30.8 % Fat Mass (lbs): 49.8 lbs Muscle Mass (lbs): 106 lbs Total Body Water (lbs): 72.6 lbs Visceral Fat Rating : 5 Other Clinical Data Fasting: no Labs: no Today's Visit #: 54 Starting Date: 04/30/19 Comments: PC/[place]

## 2023-06-27 ENCOUNTER — Encounter (INDEPENDENT_AMBULATORY_CARE_PROVIDER_SITE_OTHER): Payer: Self-pay | Admitting: Family Medicine

## 2023-06-28 NOTE — Telephone Encounter (Signed)
 OV 06/15/23, GI referral pended

## 2023-07-06 DIAGNOSIS — R140 Abdominal distension (gaseous): Secondary | ICD-10-CM | POA: Insufficient documentation

## 2023-07-06 NOTE — Addendum Note (Signed)
 Addended by: Jenean Minus on: 07/06/2023 03:14 PM   Modules accepted: Orders

## 2023-07-06 NOTE — Addendum Note (Signed)
 Addended by: Audie Bleacher A on: 07/06/2023 09:49 AM   Modules accepted: Orders

## 2023-07-06 NOTE — Telephone Encounter (Signed)
 Referral

## 2023-07-06 NOTE — Assessment & Plan Note (Signed)
 Patient is having significant uncomfortable bloating intermittently that she cannot correlate to food types or amounts.  She is s/p roux en y surgery.  Will refer to GI for further evaluation as symptoms are not controlled with OTC medications.

## 2023-07-11 ENCOUNTER — Encounter (INDEPENDENT_AMBULATORY_CARE_PROVIDER_SITE_OTHER): Payer: Self-pay | Admitting: Family Medicine

## 2023-07-11 ENCOUNTER — Ambulatory Visit (INDEPENDENT_AMBULATORY_CARE_PROVIDER_SITE_OTHER): Admitting: Family Medicine

## 2023-07-11 VITALS — BP 99/61 | HR 71 | Temp 98.2°F | Ht 67.0 in | Wt 162.0 lb

## 2023-07-11 DIAGNOSIS — R140 Abdominal distension (gaseous): Secondary | ICD-10-CM | POA: Diagnosis not present

## 2023-07-11 DIAGNOSIS — Z6825 Body mass index (BMI) 25.0-25.9, adult: Secondary | ICD-10-CM

## 2023-07-11 DIAGNOSIS — D509 Iron deficiency anemia, unspecified: Secondary | ICD-10-CM | POA: Diagnosis not present

## 2023-07-11 DIAGNOSIS — D508 Other iron deficiency anemias: Secondary | ICD-10-CM

## 2023-07-11 DIAGNOSIS — E66811 Obesity, class 1: Secondary | ICD-10-CM

## 2023-07-11 NOTE — Progress Notes (Signed)
 SUBJECTIVE:  Chief Complaint: Obesity  Interim History: Patient back in the office and is walking more.  She is also going to try to incorporate some cordless jump rope activity.  She is noticing that she does snack in her typical pattern and worries this adds up. Her wife is being seen in the [place] office.  She has a macro first snacker app to monitor her intake.  Tracks breakfast, lunch and snacking but often struggles to log for dinner.   Leah Gilmore is here to discuss her progress with her obesity treatment plan. She is on the following a lower carbohydrate, vegetable and lean protein rich diet plan and states she is following her eating plan approximately 60-70 % of the time. She states she is exercising 30 minutes 5 times per week.   OBJECTIVE: Visit Diagnoses: Problem List Items Addressed This Visit       Other   Iron  deficiency anemia - Primary   Increase in fatigue.  Patient needs more recent labs.  Will do labs at next appointment or have patient get labs with PCP or heme.      Class 1 obesity with serious comorbidity and body mass index (BMI) of 32.0 to 32.9 in adult   Anthropometric Measurements Height: 5\' 7"  (1.702 m) Weight: 162 lb (73.5 kg) BMI (Calculated): 25.37 Weight at Last Visit: 161 LB Weight Lost Since Last Visit: 0 Weight Gained Since Last Visit: 1 Starting Weight: 209 lb Total Weight Loss (lbs): 47 lb (21.3 kg) Body Composition  Body Fat %: 31.7 % Fat Mass (lbs): 51.4 lbs Muscle Mass (lbs): 105.2 lbs Total Body Water (lbs): 73.8 lbs Visceral Fat Rating : 5 Other Clinical Data Fasting: no Labs: no Today's Visit #: 55 Starting Date: 04/30/19 Comments: Low Carb       Abdominal bloating   Awaiting call to schedule consultation with GI.  Follow up in 4 weeks to see when appointment is.      Other Visit Diagnoses       BMI 25.0-25.9,adult           No data recorded       07/11/2023    8:00 AM 06/15/2023    7:00 AM 05/18/2023    8:00 AM   Vitals with BMI  Height 5\' 7"  5\' 7"  5\' 7"   Weight 162 lbs 161 lbs 157 lbs  BMI 25.37 25.21 24.58  Systolic 99 103 98  Diastolic 61 67 56  Pulse 71 64 74      ASSESSMENT AND PLAN:  Diet: Sarin is currently in the action stage of change. As such, her goal is to continue with weight loss efforts and has agreed to keeping a food journal and adhering to recommended goals of 1300-1400 calories and 90 or more grams protein and following a lower carbohydrate, vegetable and lean protein rich diet plan.  Patient to start food log or journaling meal plan.  The initial goal will be to habitually log or journal for at least 4 days a week.  The expectation it that patient may not initially meet calorie or protein goals as the nturitional understanding of food intake is begun.  We discussed the 10:1 ratio when reading a food label.  Patient agrees to keep a food log either electronically or on paper and bring to the next appointment to be able to dissect and discuss it with provider.    Exercise:  For substantial health benefits, adults should do at least 150 minutes (2 hours and  30 minutes) a week of moderate-intensity, or 75 minutes (1 hour and 15 minutes) a week of vigorous-intensity aerobic physical activity, or an equivalent combination of moderate- and vigorous-intensity aerobic activity. Aerobic activity should be performed in episodes of at least 10 minutes, and preferably, it should be spread throughout the week.  Behavior Modification:  We discussed the following Behavioral Modification Strategies today: increasing lean protein intake, decreasing simple carbohydrates, meal planning and cooking strategies, keeping healthy foods in the home, avoiding temptations, planning for success, and keep a strict food journal.   Return in about 4 weeks (around 08/08/2023).   She was informed of the importance of frequent follow up visits to maximize her success with intensive lifestyle modifications for her  multiple health conditions.  Attestation Statements:   Reviewed by clinician on day of visit: allergies, medications, problem list, medical history, surgical history, family history, social history, and previous encounter notes.    Donaciano Frizzle, MD

## 2023-07-17 NOTE — Assessment & Plan Note (Signed)
 Awaiting call to schedule consultation with GI.  Follow up in 4 weeks to see when appointment is.

## 2023-07-17 NOTE — Assessment & Plan Note (Signed)
 Anthropometric Measurements Height: 5\' 7"  (1.702 m) Weight: 162 lb (73.5 kg) BMI (Calculated): 25.37 Weight at Last Visit: 161 LB Weight Lost Since Last Visit: 0 Weight Gained Since Last Visit: 1 Starting Weight: 209 lb Total Weight Loss (lbs): 47 lb (21.3 kg) Body Composition  Body Fat %: 31.7 % Fat Mass (lbs): 51.4 lbs Muscle Mass (lbs): 105.2 lbs Total Body Water (lbs): 73.8 lbs Visceral Fat Rating : 5 Other Clinical Data Fasting: no Labs: no Today's Visit #: 55 Starting Date: 04/30/19 Comments: Low Carb

## 2023-07-17 NOTE — Assessment & Plan Note (Signed)
 Increase in fatigue.  Patient needs more recent labs.  Will do labs at next appointment or have patient get labs with PCP or heme.

## 2023-08-05 ENCOUNTER — Encounter (INDEPENDENT_AMBULATORY_CARE_PROVIDER_SITE_OTHER): Payer: Self-pay | Admitting: Family Medicine

## 2023-08-08 ENCOUNTER — Ambulatory Visit (INDEPENDENT_AMBULATORY_CARE_PROVIDER_SITE_OTHER): Admitting: Family Medicine

## 2023-09-05 ENCOUNTER — Ambulatory Visit (INDEPENDENT_AMBULATORY_CARE_PROVIDER_SITE_OTHER): Admitting: Family Medicine

## 2023-10-21 ENCOUNTER — Other Ambulatory Visit (INDEPENDENT_AMBULATORY_CARE_PROVIDER_SITE_OTHER): Payer: Self-pay | Admitting: Family Medicine

## 2023-10-21 DIAGNOSIS — E559 Vitamin D deficiency, unspecified: Secondary | ICD-10-CM

## 2023-12-23 ENCOUNTER — Other Ambulatory Visit: Payer: Self-pay | Admitting: Medical Genetics

## 2023-12-23 DIAGNOSIS — Z006 Encounter for examination for normal comparison and control in clinical research program: Secondary | ICD-10-CM

## 2024-01-24 LAB — GENECONNECT MOLECULAR SCREEN: Genetic Analysis Overall Interpretation: NEGATIVE

## 2024-05-11 ENCOUNTER — Encounter: Admitting: Family Medicine
